# Patient Record
Sex: Female | Born: 1955
Health system: Southern US, Community
[De-identification: ages and names within clinical notes are randomized; demographics above are authoritative.]

## PROBLEM LIST (undated history)

## (undated) DIAGNOSIS — K219 Gastro-esophageal reflux disease without esophagitis: Secondary | ICD-10-CM

## (undated) DIAGNOSIS — E039 Hypothyroidism, unspecified: Secondary | ICD-10-CM

## (undated) DIAGNOSIS — E079 Disorder of thyroid, unspecified: Secondary | ICD-10-CM

## (undated) DIAGNOSIS — F419 Anxiety disorder, unspecified: Secondary | ICD-10-CM

## (undated) DIAGNOSIS — N3010 Interstitial cystitis (chronic) without hematuria: Secondary | ICD-10-CM

## (undated) DIAGNOSIS — M1990 Unspecified osteoarthritis, unspecified site: Secondary | ICD-10-CM

## (undated) DIAGNOSIS — R002 Palpitations: Secondary | ICD-10-CM

## (undated) DIAGNOSIS — E28319 Asymptomatic premature menopause: Secondary | ICD-10-CM

## (undated) DIAGNOSIS — M7918 Myalgia, other site: Secondary | ICD-10-CM

## (undated) DIAGNOSIS — K259 Gastric ulcer, unspecified as acute or chronic, without hemorrhage or perforation: Secondary | ICD-10-CM

## (undated) HISTORY — DX: Hypothyroidism, unspecified: E03.9

## (undated) HISTORY — DX: Interstitial cystitis (chronic) without hematuria: N30.10

## (undated) HISTORY — DX: Gastric ulcer, unspecified as acute or chronic, without hemorrhage or perforation: K25.9

## (undated) HISTORY — PX: CHOLECYSTECTOMY: SHX55

## (undated) HISTORY — PX: TONSILLECTOMY: SUR1361

## (undated) HISTORY — DX: Unspecified osteoarthritis, unspecified site: M19.90

## (undated) HISTORY — PX: CERVICAL CONE BIOPSY: SUR198

## (undated) HISTORY — DX: Palpitations: R00.2

## (undated) HISTORY — DX: Myalgia, other site: M79.18

## (undated) HISTORY — DX: Asymptomatic premature menopause: E28.319

## (undated) HISTORY — PX: DILATION AND CURETTAGE OF UTERUS: SHX78

## (undated) HISTORY — PX: CERVIX LESION DESTRUCTION: SHX591

---

## 1998-03-11 ENCOUNTER — Other Ambulatory Visit: Admission: RE | Admit: 1998-03-11 | Discharge: 1998-03-11 | Payer: Self-pay | Admitting: Gynecology

## 1998-05-09 ENCOUNTER — Other Ambulatory Visit: Admission: RE | Admit: 1998-05-09 | Discharge: 1998-05-09 | Payer: Self-pay | Admitting: Gynecology

## 1999-05-31 ENCOUNTER — Other Ambulatory Visit: Admission: RE | Admit: 1999-05-31 | Discharge: 1999-05-31 | Payer: Self-pay | Admitting: Gynecology

## 1999-12-19 ENCOUNTER — Emergency Department (HOSPITAL_COMMUNITY): Admission: EM | Admit: 1999-12-19 | Discharge: 1999-12-19 | Payer: Self-pay | Admitting: Podiatry

## 1999-12-22 ENCOUNTER — Encounter (HOSPITAL_COMMUNITY): Admission: RE | Admit: 1999-12-22 | Discharge: 2000-03-21 | Payer: Self-pay | Admitting: *Deleted

## 2000-06-17 ENCOUNTER — Other Ambulatory Visit: Admission: RE | Admit: 2000-06-17 | Discharge: 2000-06-17 | Payer: Self-pay | Admitting: Gynecology

## 2001-06-18 ENCOUNTER — Other Ambulatory Visit: Admission: RE | Admit: 2001-06-18 | Discharge: 2001-06-18 | Payer: Self-pay | Admitting: Gynecology

## 2002-07-30 ENCOUNTER — Other Ambulatory Visit: Admission: RE | Admit: 2002-07-30 | Discharge: 2002-07-30 | Payer: Self-pay | Admitting: Gynecology

## 2003-09-13 ENCOUNTER — Other Ambulatory Visit: Admission: RE | Admit: 2003-09-13 | Discharge: 2003-09-13 | Payer: Self-pay | Admitting: Gynecology

## 2004-11-22 ENCOUNTER — Other Ambulatory Visit: Admission: RE | Admit: 2004-11-22 | Discharge: 2004-11-22 | Payer: Self-pay | Admitting: Gynecology

## 2004-11-22 ENCOUNTER — Ambulatory Visit: Payer: Self-pay | Admitting: Internal Medicine

## 2004-11-30 ENCOUNTER — Ambulatory Visit: Payer: Self-pay | Admitting: Internal Medicine

## 2004-12-08 ENCOUNTER — Ambulatory Visit: Payer: Self-pay | Admitting: Internal Medicine

## 2004-12-22 ENCOUNTER — Ambulatory Visit: Payer: Self-pay | Admitting: Internal Medicine

## 2005-01-01 ENCOUNTER — Ambulatory Visit: Payer: Self-pay | Admitting: Internal Medicine

## 2005-01-12 ENCOUNTER — Ambulatory Visit: Payer: Self-pay | Admitting: Internal Medicine

## 2005-01-22 ENCOUNTER — Ambulatory Visit: Payer: Self-pay | Admitting: Internal Medicine

## 2005-02-02 ENCOUNTER — Ambulatory Visit: Payer: Self-pay | Admitting: Internal Medicine

## 2005-02-22 ENCOUNTER — Ambulatory Visit: Payer: Self-pay | Admitting: Internal Medicine

## 2005-03-01 ENCOUNTER — Ambulatory Visit: Payer: Self-pay | Admitting: Internal Medicine

## 2005-03-09 ENCOUNTER — Ambulatory Visit: Payer: Self-pay | Admitting: Internal Medicine

## 2005-03-20 ENCOUNTER — Ambulatory Visit: Payer: Self-pay | Admitting: Internal Medicine

## 2005-03-27 ENCOUNTER — Ambulatory Visit: Payer: Self-pay | Admitting: Internal Medicine

## 2005-04-09 ENCOUNTER — Ambulatory Visit: Payer: Self-pay | Admitting: Internal Medicine

## 2005-04-20 ENCOUNTER — Ambulatory Visit: Payer: Self-pay | Admitting: Internal Medicine

## 2005-04-24 ENCOUNTER — Ambulatory Visit: Payer: Self-pay | Admitting: Internal Medicine

## 2005-05-03 ENCOUNTER — Ambulatory Visit: Payer: Self-pay | Admitting: Internal Medicine

## 2005-05-24 ENCOUNTER — Ambulatory Visit: Payer: Self-pay | Admitting: Internal Medicine

## 2005-05-28 ENCOUNTER — Ambulatory Visit: Payer: Self-pay | Admitting: Internal Medicine

## 2005-06-04 ENCOUNTER — Ambulatory Visit: Payer: Self-pay | Admitting: Internal Medicine

## 2005-06-14 ENCOUNTER — Ambulatory Visit: Payer: Self-pay | Admitting: Internal Medicine

## 2005-06-29 ENCOUNTER — Ambulatory Visit: Payer: Self-pay | Admitting: Internal Medicine

## 2005-07-16 ENCOUNTER — Ambulatory Visit: Payer: Self-pay | Admitting: Internal Medicine

## 2005-07-26 ENCOUNTER — Ambulatory Visit: Payer: Self-pay | Admitting: Internal Medicine

## 2005-08-16 ENCOUNTER — Ambulatory Visit: Payer: Self-pay | Admitting: Internal Medicine

## 2005-08-23 ENCOUNTER — Ambulatory Visit: Payer: Self-pay | Admitting: Internal Medicine

## 2005-09-11 ENCOUNTER — Ambulatory Visit: Payer: Self-pay | Admitting: Internal Medicine

## 2005-12-24 ENCOUNTER — Other Ambulatory Visit: Admission: RE | Admit: 2005-12-24 | Discharge: 2005-12-24 | Payer: Self-pay | Admitting: Gynecology

## 2006-05-01 ENCOUNTER — Encounter: Admission: RE | Admit: 2006-05-01 | Discharge: 2006-05-01 | Payer: Self-pay | Admitting: Gastroenterology

## 2006-06-24 ENCOUNTER — Other Ambulatory Visit: Admission: RE | Admit: 2006-06-24 | Discharge: 2006-06-24 | Payer: Self-pay | Admitting: Gynecology

## 2006-07-08 ENCOUNTER — Encounter: Admission: RE | Admit: 2006-07-08 | Discharge: 2006-07-08 | Payer: Self-pay | Admitting: General Surgery

## 2006-08-05 ENCOUNTER — Encounter: Admission: RE | Admit: 2006-08-05 | Discharge: 2006-08-05 | Payer: Self-pay | Admitting: General Surgery

## 2007-07-09 ENCOUNTER — Other Ambulatory Visit: Admission: RE | Admit: 2007-07-09 | Discharge: 2007-07-09 | Payer: Self-pay | Admitting: Gynecology

## 2008-07-27 ENCOUNTER — Other Ambulatory Visit: Admission: RE | Admit: 2008-07-27 | Discharge: 2008-07-27 | Payer: Self-pay | Admitting: Gynecology

## 2008-10-20 ENCOUNTER — Emergency Department (HOSPITAL_COMMUNITY): Admission: EM | Admit: 2008-10-20 | Discharge: 2008-10-20 | Payer: Self-pay | Admitting: Family Medicine

## 2009-05-26 ENCOUNTER — Encounter: Admission: RE | Admit: 2009-05-26 | Discharge: 2009-05-26 | Payer: Self-pay | Admitting: Gastroenterology

## 2011-03-20 LAB — HM DEXA SCAN

## 2011-04-17 ENCOUNTER — Ambulatory Visit (INDEPENDENT_AMBULATORY_CARE_PROVIDER_SITE_OTHER): Payer: 59 | Admitting: Sports Medicine

## 2011-04-17 ENCOUNTER — Encounter: Payer: Self-pay | Admitting: Sports Medicine

## 2011-04-17 VITALS — BP 104/71 | Ht 59.5 in | Wt 97.0 lb

## 2011-04-17 DIAGNOSIS — M722 Plantar fascial fibromatosis: Secondary | ICD-10-CM

## 2011-04-17 DIAGNOSIS — M2020 Hallux rigidus, unspecified foot: Secondary | ICD-10-CM | POA: Insufficient documentation

## 2011-04-17 NOTE — Assessment & Plan Note (Signed)
Discussed with pt the importance to keep movement Gave ROM of motion exercises.  Will follow up in 6-8 weeks.

## 2011-04-17 NOTE — Progress Notes (Signed)
  Subjective:    Patient ID: Kristin Andersen, female    DOB: 11-17-1956, 55 y.o.   MRN: [id number]  HPI 55 yo female since February started having on and off right foot pain, but last 2 weeks has increased in pain.  Pt states the pain is worse right when she wakes up in the Am, takes time to relax, if she gets up from a seated position, or with significant activity.  The pain has made pt start to change activities of daily living.  Pt unable to run or do yoga at this time. Pt states the pain is on the plantar aspect of foot more medial non radiating no achilles pain associated, no true swelling or discoloration noted.  Pt denies injury but does state that it did seem to start after starting a dance class.   Review of Systems No numbness, loss of sensation, ever given out on her or popping, swelling or redness.     Objective:   Physical Exam    right foot:Normal inspection with no visable or palpable fat pad atrophy and no visible swelling/erythema. Patient is tender at medial insertion of plantar fascia into calcaneus. Great toe motion: restricted especially in extension Arch shape: moderate longitudinal arch present.  Other foot breakdown: none  Ultrasound:  Right foot, does show .45cm thickening of the plantar fascia at the insertion which is significantly thickened compared to left side (.33cm).  No true rupture mild calcinosis at the area of insertion no true edema seen, non tender during exam.  4 cm distal to insertion is a hypoechoic area and here the PF is much thicker than at baseline at 0.57 cms. Mild calcified area at the 1st MTP, but tendon moved throughout ROM without click/pop.  No edema. Achilles tendon appeared normal.    Assessment & Plan:

## 2011-04-17 NOTE — Patient Instructions (Signed)
Handout given with exercises.

## 2011-04-17 NOTE — Assessment & Plan Note (Signed)
Primary problem. Gave exercises and stretches for pt to do Gave arch strap Gave insoles with scaphoid pad. Will have pt RTC in 6-8 weeks to see if improving.

## 2011-06-20 LAB — HM COLONOSCOPY: HM Colonoscopy: NORMAL

## 2012-02-02 ENCOUNTER — Encounter (HOSPITAL_COMMUNITY): Payer: Self-pay | Admitting: Emergency Medicine

## 2012-02-02 ENCOUNTER — Emergency Department (HOSPITAL_COMMUNITY)
Admission: EM | Admit: 2012-02-02 | Discharge: 2012-02-02 | Disposition: A | Discharge status: Home / Self Care | Payer: 59 | Source: Home / Self Care | Attending: Family Medicine | Admitting: Family Medicine

## 2012-02-02 DIAGNOSIS — H109 Unspecified conjunctivitis: Secondary | ICD-10-CM

## 2012-02-02 HISTORY — DX: Disorder of thyroid, unspecified: E07.9

## 2012-02-02 MED ORDER — MOXIFLOXACIN HCL 0.5 % OP SOLN: 1.0000 [drp] | Freq: Three times a day (TID) | OPHTHALMIC | Status: AC

## 2012-02-02 NOTE — ED Notes (Signed)
Onset yesterday evening of pink eye.  Reports having a virus on Sunday, saw dr Clelia Croft, pcp, yesterday.  Eye symptoms started last night and crusty secretions to both eyes, but the right eye seems to be the worse, right eye is particularly itchy.  Started amoxicillin yesterday

## 2012-02-02 NOTE — ED Provider Notes (Signed)
History     CSN: [id number]  Arrival date & time 02/02/12  0910   First MD Initiated Contact with Patient 02/02/12 [phone]      Chief Complaint  Patient presents with  . Conjunctivitis    (Consider location/radiation/quality/duration/timing/severity/associated sxs/prior treatment) Patient is a 56 y.o. female presenting with conjunctivitis. The history is provided by the patient.  Conjunctivitis  The current episode started yesterday (sx onset last eve, seen by lmd for uri yest given amox,.). The problem has been gradually worsening. The problem is mild. Associated symptoms include eye itching, congestion, rhinorrhea, eye discharge and eye redness. Pertinent negatives include no eye pain.    Past Medical History  Diagnosis Date  . Thyroid disease   . Osteoporosis     Past Surgical History  Procedure Date  . Cesarean section   . Cholecystectomy     No family history on file.  History  Substance Use Topics  . Smoking status: Never Smoker   . Smokeless tobacco: Not on file  . Alcohol Use: Yes    OB History    Grav Para Term Preterm Abortions TAB SAB Ect Mult Living                  Review of Systems  Constitutional: Negative.   HENT: Positive for congestion, rhinorrhea and postnasal drip.   Eyes: Positive for discharge, redness and itching. Negative for pain.    Allergies  Review of patient's allergies indicates no known allergies.  Home Medications   Current Outpatient Rx  Name Route Sig Dispense Refill  . AMOXICILLIN PO Oral Take by mouth.    Marland Kitchen VITAMIN D PO Oral Take by mouth. calcium    . SYNTHROID PO Oral Take by mouth.    . RECLAST IV Intravenous Inject into the vein.    Marland Kitchen MOXIFLOXACIN HCL 0.5 % OP SOLN Both Eyes Place 1 drop into both eyes 3 (three) times daily. After warm compress soak to eyes 3 mL 0    BP 113/71  Pulse 71  Temp(Src) 99.4 F (37.4 C) (Oral)  Resp 17  SpO2 99%  Physical Exam  Nursing note and vitals reviewed. Constitutional:  She appears well-developed and well-nourished.  HENT:  Head: Normocephalic.  Right Ear: External ear normal.  Left Ear: External ear normal.  Nose: Nose normal.  Mouth/Throat: Oropharynx is clear and moist.  Eyes: EOM are normal. Pupils are equal, round, and reactive to light. Right eye exhibits discharge. Right eye exhibits no exudate. Left eye exhibits discharge. Left eye exhibits no exudate. Right conjunctiva is injected. Left conjunctiva is injected.    ED Course  Procedures (including critical care time)  Labs Reviewed - No data to display No results found.   1. Conjunctivitis of both eyes       MDM          Linna Hoff, MD 02/02/12 [phone]

## 2012-02-15 ENCOUNTER — Emergency Department (INDEPENDENT_AMBULATORY_CARE_PROVIDER_SITE_OTHER)
Admission: EM | Admit: 2012-02-15 | Discharge: 2012-02-15 | Disposition: A | Discharge status: Home Health | Payer: 59 | Source: Home / Self Care | Attending: Emergency Medicine | Admitting: Emergency Medicine

## 2012-02-15 ENCOUNTER — Encounter (HOSPITAL_COMMUNITY): Payer: Self-pay

## 2012-02-15 DIAGNOSIS — J329 Chronic sinusitis, unspecified: Secondary | ICD-10-CM

## 2012-02-15 MED ORDER — AZITHROMYCIN 250 MG PO TABS: 250.0000 mg | ORAL_TABLET | Freq: Every day | ORAL | Status: DC

## 2012-02-15 MED ORDER — AZITHROMYCIN 250 MG PO TABS: 250.0000 mg | ORAL_TABLET | Freq: Every day | ORAL | Status: AC

## 2012-02-15 NOTE — ED Notes (Signed)
Pt c/o sinus pain.  Pt states she was tx with ABX (amoxicillian) completed 10 day course Sunday and SX have returned.

## 2012-02-15 NOTE — Discharge Instructions (Signed)
Sinusitis Sinusitis an infection of the air pockets (sinuses) in your face. This can cause puffiness (swelling). It can also cause drainage from your sinuses.   HOME CARE    Only take medicine as told by your doctor.   Drink enough fluids to keep your pee (urine) clear or pale yellow.   Apply moist heat or ice packs for pain relief.   Use salt (saline) nose sprays. The spray will wet the thick fluid in the nose. This can help the sinuses drain.  GET HELP RIGHT AWAY IF:    You have a fever.   Your baby is older than 3 months with a rectal temperature of 102 F (38.9 C) or higher.   Your baby is 3 months old or younger with a rectal temperature of 100.4 F (38 C) or higher.   The pain gets worse.   You get a very bad headache.   You keep throwing up (vomiting).   Your face gets puffy.  MAKE SURE YOU:    Understand these instructions.   Will watch your condition.   Will get help right away if you are not doing well or get worse.  Document Released: 04/23/2008 Document Revised: 10/25/2011 Document Reviewed: 04/23/2008 [employer] Patient Information 2012 [employer]. 

## 2012-02-15 NOTE — ED Provider Notes (Signed)
History     CSN: [id number]  Arrival date & time 02/15/12  7829   First MD Initiated Contact with Patient 02/15/12 1033      Chief Complaint  Patient presents with  . Sinusitis    (Consider location/radiation/quality/duration/timing/severity/associated sxs/prior treatment) HPI Comments: For the last 3-4 days Sinus congestion has returned with significant pressure and drainage in my throat. Haven't completed and antibiotics cycle for 10 days because it was having a sinus infection it was not getting better on his own after I had the flu was seen by my Dr. and she started me on amoxicillin.  After I finished treatment for about 2 days felt much improved. No fevers. In mild cough  Patient is a 56 y.o. female presenting with sinusitis. The history is provided by the patient.  Sinusitis  This is a recurrent problem. The current episode started more than 2 days ago. The problem has been gradually worsening. There has been no fever. The pain is at a severity of 6/10. The pain is moderate. Associated symptoms include chills, congestion, sinus pressure, sore throat and cough. Pertinent negatives include no swollen glands and no shortness of breath. She has tried other medications for the symptoms. The treatment provided no relief.    Past Medical History  Diagnosis Date  . Thyroid disease   . Osteoporosis     Past Surgical History  Procedure Date  . Cesarean section   . Cholecystectomy     No family history on file.  History  Substance Use Topics  . Smoking status: Never Smoker   . Smokeless tobacco: Not on file  . Alcohol Use: Yes    OB History    Grav Para Term Preterm Abortions TAB SAB Ect Mult Living                  Review of Systems  Constitutional: Positive for chills. Negative for fever, diaphoresis and activity change.  HENT: Positive for congestion, sore throat, rhinorrhea and sinus pressure.   Respiratory: Positive for cough. Negative for shortness of breath.    Genitourinary: Negative for dysuria.  Skin: Negative for rash.  Neurological: Negative for speech difficulty and headaches.    Allergies  Review of patient's allergies indicates no known allergies.  Home Medications   Current Outpatient Rx  Name Route Sig Dispense Refill  . AMOXICILLIN PO Oral Take by mouth.    . AZITHROMYCIN 250 MG PO TABS Oral Take 1 tablet (250 mg total) by mouth daily. Take first 2 tablets together, then 1 every day until finished. 6 tablet 0  . VITAMIN D PO Oral Take by mouth. calcium    . SYNTHROID PO Oral Take by mouth.    . RECLAST IV Intravenous Inject into the vein.      BP 90/62  Pulse 80  Temp(Src) 97.4 F (36.3 C) (Oral)  Resp 20  Ht 4' 11.5" (1.511 m)  Wt 98 lb (44.453 kg)  BMI 19.46 kg/m2  SpO2 97%  Physical Exam  Nursing note and vitals reviewed. Constitutional: She is oriented to person, place, and time. She appears well-developed and well-nourished. No distress.  HENT:  Head: Normocephalic.  Right Ear: Tympanic membrane normal.  Left Ear: Tympanic membrane normal.  Nose: Rhinorrhea present. Right sinus exhibits frontal sinus tenderness. Left sinus exhibits frontal sinus tenderness.  Mouth/Throat: Uvula is midline. Posterior oropharyngeal erythema present. No oropharyngeal exudate.  Eyes: Conjunctivae are normal. No scleral icterus.  Neck: Neck supple. No JVD present.  Pulmonary/Chest: Effort  normal. No respiratory distress. She has no wheezes.  Abdominal: She exhibits no mass.  Musculoskeletal: Normal range of motion.  Lymphadenopathy:    She has no cervical adenopathy.  Neurological: She is alert and oriented to person, place, and time. She is not disoriented. No cranial nerve deficit or sensory deficit.  Skin: No rash noted.    ED Course  Procedures (including critical care time)  Labs Reviewed - No data to display No results found.   1. Sinusitis       MDM  Recurrent sinusitis        Jimmie Molly, MD 02/15/12  [phone]

## 2012-02-18 LAB — HM PAP SMEAR: HM Pap smear: NEGATIVE

## 2012-06-20 ENCOUNTER — Encounter: Payer: Self-pay | Admitting: Sports Medicine

## 2012-06-20 ENCOUNTER — Ambulatory Visit (INDEPENDENT_AMBULATORY_CARE_PROVIDER_SITE_OTHER): Payer: 59 | Admitting: Sports Medicine

## 2012-06-20 VITALS — BP 111/75 | HR 67 | Ht 59.5 in | Wt 98.0 lb

## 2012-06-20 DIAGNOSIS — M999 Biomechanical lesion, unspecified: Secondary | ICD-10-CM | POA: Insufficient documentation

## 2012-06-20 DIAGNOSIS — G5701 Lesion of sciatic nerve, right lower limb: Secondary | ICD-10-CM | POA: Insufficient documentation

## 2012-06-20 DIAGNOSIS — G5700 Lesion of sciatic nerve, unspecified lower limb: Secondary | ICD-10-CM

## 2012-06-20 NOTE — Assessment & Plan Note (Signed)
Decision today to treat with OMT was based on Physical Exam  After verbal consent patient was treated with ME and FPR techniques in sacral areas. Do to patient's sacral rotation this is likely causing patient's worsening of her piriformis syndrome.  Patient tolerated the procedure well with improvement in symptoms  Patient given exercises, stretches and lifestyle modifications including discussing proper footwear.  See medications in patient instructions if given  Patient will follow up in 3-4 weeks if needed patient may have another manipulation.

## 2012-06-20 NOTE — Progress Notes (Signed)
Kristin Andersen is a very pleasant 56 year old female coming in with 5 days of right posterior hip pain. Patient states that she was working out did a significant amount of dead lifts with her exercise program but did not have any pain there is bedtime. Patient noticed it 2 days later that she was having significant amount of pain in the area and a lot of stiffness. Patient does not remember any specific injury. Patient states that the pain is in the sacroiliac area as well as in the buttocks. Patient states that the pain seems to radiate around into the anterior hip but only minimal. Patient states that this has stopped her from doing any type of her regular activities due to the pain. Patient states that it is more of a dull throbbing in nature but with certain times of shooting pain. Patient denies any radiation down the leg and has had a history of sciatica on the contralateral side. Patient states that it's worse when she seems to be getting up in the morning or she is set for long amount of time. Patient is only tried anti-inflammatories once and states that it improved minimally.  Past medical history, social, surgical and family history all reviewed.   Review of systems is negative for any numbness or tingling in the extremity, any loss of strength, any swelling in the area in any crepitus. Patient also denies that the hip is having given out on her. Patient also denies fever chills or any recent viral illnesses.  Physical exam: Filed Vitals:   06/20/12 1140  BP: 111/75  Pulse: 67   General: No apparent distress very pleasant lady alert and oriented x3 mood is normal. Gait patient is very reserved with getting up from a sitting position do to stiffness. Patient though once able to ambulate does fine without much pain. Hip: ROM IR: 30 Deg, ER: 70 Deg, Flexion: 120 Deg, Extension: 100 Deg, Abduction: 45 Deg, Adduction: 45 Deg Strength IR: 5/5, ER: 5/5, Flexion: 5/5, Extension: 5/5, Abduction: 4/5,  Adduction: 4/5 Pelvic alignment unremarkable to inspection and palpation. Standing hip rotation and gait without trendelenburg / unsteadiness. Greater trochanter with minimal TTP. Positive tenderness over piriformis. Positive SI joint tenderness and normal minimal SI movement.  Osteopathic manipulation therapy findings. Patient does have a sacrum that is rotated right on right. Patient also has a right anterior ilium.

## 2012-06-20 NOTE — Assessment & Plan Note (Signed)
Patient was able to be manipulated with osteopathic therapy after verbal consent with 30% improvement in pain and immediately. Patient given exercises and stretches to do at home. In addition patient will do a three-day burst of anti-inflammatories scheduled to try to decrease inflammation. Patient told she can start doing her regular activities again and let pain be her guide. Patient continues to have pain she'll come back in 3 weeks' time for further evaluation with potential imaging.

## 2012-08-12 LAB — HM MAMMOGRAPHY: HM Mammogram: NEGATIVE

## 2012-09-19 HISTORY — PX: ROOT CANAL: SHX2363

## 2012-11-21 DIAGNOSIS — M7918 Myalgia, other site: Secondary | ICD-10-CM | POA: Insufficient documentation

## 2012-11-28 ENCOUNTER — Emergency Department (HOSPITAL_COMMUNITY)
Admission: EM | Admit: 2012-11-28 | Discharge: 2012-11-28 | Disposition: A | Discharge status: Home / Self Care | Payer: 59 | Attending: Emergency Medicine | Admitting: Emergency Medicine

## 2012-11-28 ENCOUNTER — Encounter (HOSPITAL_COMMUNITY): Payer: Self-pay | Admitting: Emergency Medicine

## 2012-11-28 DIAGNOSIS — Z79899 Other long term (current) drug therapy: Secondary | ICD-10-CM | POA: Insufficient documentation

## 2012-11-28 DIAGNOSIS — R6884 Jaw pain: Secondary | ICD-10-CM | POA: Insufficient documentation

## 2012-11-28 DIAGNOSIS — R109 Unspecified abdominal pain: Secondary | ICD-10-CM | POA: Insufficient documentation

## 2012-11-28 DIAGNOSIS — E079 Disorder of thyroid, unspecified: Secondary | ICD-10-CM | POA: Insufficient documentation

## 2012-11-28 DIAGNOSIS — M810 Age-related osteoporosis without current pathological fracture: Secondary | ICD-10-CM | POA: Insufficient documentation

## 2012-11-28 DIAGNOSIS — K5900 Constipation, unspecified: Secondary | ICD-10-CM | POA: Insufficient documentation

## 2012-11-28 DIAGNOSIS — K5641 Fecal impaction: Secondary | ICD-10-CM | POA: Insufficient documentation

## 2012-11-28 MED ORDER — PEG 3350-KCL-NABCB-NACL-NASULF 236 G PO SOLR: 4.0000 L | Freq: Once | ORAL | Status: DC

## 2012-11-28 NOTE — ED Notes (Signed)
Pt presenting to ed with c/o constipation x 4-5 days pt states abdominal cramping. Pt states she has been on some medications that she thinks may be causing her constipation. Pt states she was on vicodin but she hasn't taken it in about 1 week. Pt states she called her pcp and was told to present to the er

## 2012-11-28 NOTE — ED Provider Notes (Signed)
History     CSN: [id number]  Arrival date & time 11/28/12  1348   First MD Initiated Contact with Patient 11/28/12 1751      Chief Complaint  Patient presents with  . Constipation    (Consider location/radiation/quality/duration/timing/severity/associated sxs/prior treatment) HPI This 57 year old female has been on narcotics recently for a jaw pain, she has now been on a clear liquid diet for several days to avoid chewing to avoid causing worse jaw pain, she has not taken narcotics in a few days but has not had a bowel movement in several days, and she is intermittent abdominal crampy pain but no constant or localized abdominal pain, she is no bowel distention and bloating or nausea or vomiting, she is no chest pain cough or fever, there is no treatment prior to arrival other than Colace and another over-the-counter oral stool softener. Past Medical History  Diagnosis Date  . Thyroid disease   . Osteoporosis     Past Surgical History  Procedure Date  . Cesarean section   . Cholecystectomy     No family history on file.  History  Substance Use Topics  . Smoking status: Never Smoker   . Smokeless tobacco: Never Used  . Alcohol Use: Yes     Comment: rarely    OB History    Grav Para Term Preterm Abortions TAB SAB Ect Mult Living                  Review of Systems 10 Systems reviewed and are negative for acute change except as noted in the HPI. Allergies  Review of patient's allergies indicates no known allergies.  Home Medications   Current Outpatient Rx  Name  Route  Sig  Dispense  Refill  . VITAMIN D PO   Oral   Take 1,000 Units by mouth daily. calcium         . IBUPROFEN 200 MG PO TABS   Oral   Take 800 mg by mouth every 6 (six) hours as needed.         Marland Kitchen SYNTHROID PO   Oral   Take 50 mcg by mouth daily.          Marland Kitchen OXYMETAZOLINE HCL 0.05 % NA SOLN   Nasal   Place 2 sprays into the nose 2 (two) times daily.         Marland Kitchen ALIGN 4 MG PO CAPS  Oral   Take 1 capsule by mouth daily.         Marland Kitchen VITAMIN B-1 50 MG PO TABS   Oral   Take 50 mg by mouth daily.         Marland Kitchen PEG 3350-KCL-NABCB-NACL-NASULF 236 G PO SOLR   Oral   Take 4,000 mLs by mouth once.   4000 mL   0     BP 105/67  Pulse 71  Temp 98.9 F (37.2 C) (Oral)  Resp 18  SpO2 100%  Physical Exam  Nursing note and vitals reviewed. Constitutional:       Awake, alert, nontoxic appearance.  HENT:  Head: Atraumatic.  Eyes: Right eye exhibits no discharge. Left eye exhibits no discharge.  Neck: Neck supple.  Cardiovascular: Normal rate and regular rhythm.   No murmur heard. Pulmonary/Chest: Effort normal and breath sounds normal. No respiratory distress. She has no wheezes. She has no rales. She exhibits no tenderness.  Abdominal: Soft. Bowel sounds are normal. She exhibits no distension and no mass. There is no tenderness. There is  no rebound and no guarding.  Genitourinary:       Chaperone present, rectal examination reveals positive fecal impaction with hemorrhoids external without active bleeding upon initial evaluation, patient partially disimpacted manually by myself and enema ordered  Musculoskeletal: She exhibits no edema and no tenderness.       Baseline ROM, no obvious new focal weakness.  Neurological: She is alert.       Mental status and motor strength appears baseline for patient and situation.  Skin: No rash noted.  Psychiatric: She has a normal mood and affect.    ED Course  Procedures (including critical care time) Chaperone present for manual disimpaction by myself in the emergency department. Before the patient was even given an enema she was able to have a large bowel movement and felt much improved. Labs Reviewed - No data to display No results found.   1. Fecal impaction   2. Constipation       MDM  Pt stable in ED with no significant deterioration in condition.Patient / Family / Caregiver informed of clinical course, understand  medical decision-making process, and agree with plan.  I doubt any other EMC precluding discharge at this time including, but not necessarily limited to the following: Bowel obstruction.         Hurman Horn, MD 11/29/12 [phone]

## 2013-01-10 ENCOUNTER — Encounter: Payer: Self-pay | Admitting: Neurology

## 2013-02-26 ENCOUNTER — Ambulatory Visit (INDEPENDENT_AMBULATORY_CARE_PROVIDER_SITE_OTHER): Payer: PRIVATE HEALTH INSURANCE | Admitting: Neurology

## 2013-02-26 ENCOUNTER — Encounter: Payer: Self-pay | Admitting: Neurology

## 2013-02-26 VITALS — BP 106/73 | HR 76 | Ht 60.0 in | Wt 101.0 lb

## 2013-02-26 DIAGNOSIS — IMO0001 Reserved for inherently not codable concepts without codable children: Secondary | ICD-10-CM

## 2013-02-26 DIAGNOSIS — M7918 Myalgia, other site: Secondary | ICD-10-CM

## 2013-02-26 MED ORDER — MELOXICAM 7.5 MG PO TABS: 7.5000 mg | ORAL_TABLET | Freq: Every day | ORAL | Status: DC

## 2013-02-26 NOTE — Progress Notes (Signed)
Reason for visit: Jaw pain  Kristin Andersen is an 57 y.o. female  History of present illness:  Kristin Andersen is a 57 year old left-handed white female with a history of a myofascial pain syndrome on the right. This problem occurred following a dental procedure. The patient has had significant discomfort involving the right jaw. The patient initially could not tolerate the gabapentin or Robaxin. The patient was placed on Amrix, and this seemed to help. The patient was sent for neuromuscular therapy through [facility]. The patient has been receiving massage and ultrasound treatments which have been very effective. The patient still has some achy sensations in the jaw, but in general, she is much better. The patient tends to grind her teeth at night, but she is unable to wear her bite block secondary to the temporary crowns that are in her mouth. The patient will require another dental procedure to put the permanent crowns in. The patient had been on Advil, and she has since stopped the Amrix. The patient returns for an evaluation.  Past Medical History  Diagnosis Date  . Thyroid disease   . Osteoporosis   . Myofascial pain dysfunction syndrome     Right side  . Hypothyroidism     Past Surgical History  Procedure Laterality Date  . Cesarean section    . Cholecystectomy    . Tonsillectomy      Family History  Problem Relation Age of Onset  . Alzheimer's disease Mother   . Liver cancer Father   . Other Brother     Good Health  . Cancer Other     Social history:  reports that she has never smoked. She has never used smokeless tobacco. She reports that she drinks about 2.4 ounces of alcohol per week. She reports that she does not use illicit drugs.  Allergies:  Allergies  Allergen Reactions  . Dust Mite Extract   . Mold Extract (Trichophyton)   . Gabapentin     Dizzy, nausea  . Robaxin (Methocarbamol)     Dizzy, nausea    Medications:  Current Outpatient  Prescriptions on File Prior to Visit  Medication Sig Dispense Refill  . Cholecalciferol (VITAMIN D PO) Take 1,000 Units by mouth daily. calcium      . Levothyroxine Sodium (SYNTHROID PO) Take 50 mcg by mouth daily.       . Probiotic Product (ALIGN) 4 MG CAPS Take 1 capsule by mouth daily.      Marland Kitchen thiamine (VITAMIN B-1) 50 MG tablet Take 50 mg by mouth daily.      Marland Kitchen HYDROcodone-acetaminophen (NORCO/VICODIN) 5-325 MG per tablet Take 1 tablet by mouth every 4 (four) hours as needed for pain (Every 4-6 hours PRN).      Marland Kitchen ibuprofen (ADVIL,MOTRIN) 200 MG tablet Take 800 mg by mouth every 6 (six) hours as needed.      Marland Kitchen oxymetazoline (AFRIN) 0.05 % nasal spray Place 2 sprays into the nose 2 (two) times daily.      . polyethylene glycol (GOLYTELY) 236 G solution Take 4,000 mLs by mouth once.  4000 mL  0   No current facility-administered medications on file prior to visit.    ROS:  Out of a complete 14 system review of symptoms, the patient complains only of the following symptoms, and all other reviewed systems are negative.  Jaw pain  Blood pressure 106/73, pulse 76, height 5' (1.524 m), weight 101 lb (45.813 kg).  Physical Exam  General: The patient is alert  and cooperative at the time of the examination.  Skin: No significant peripheral edema is noted.   Neurologic Exam  Cranial nerves: Facial symmetry is present. Speech is normal, no aphasia or dysarthria is noted. Extraocular movements are full. Visual fields are full. With jaw opening and closure, there is some deviation of the jaw to the right when she opens her mouth. No crepitus is noted in the temporomandibular joints. The patient has good excursion of the jaw with opening and closing.  Motor: The patient has good strength in all 4 extremities.  Coordination: The patient has good finger-nose-finger and heel-to-shin bilaterally.  Gait and station: The patient has a normal gait. Tandem gait is normal. Romberg is negative. No  drift is seen.  Reflexes: Deep tendon reflexes are symmetric.   Assessment/Plan:  One. Myofascial pain syndrome  The patient is slowly improving with her pain syndrome, but she is still having some symptoms. The patient will be placed on Mobic to take on a daily basis over the next several months. The patient will require another dental procedure to put in the permanent crowns. This may exacerbate her pain. The patient is to continue therapy through [facility] for now. The patient has Amrix to take if needed. The patient will otherwise followup in 6 months or as needed.  Kristin Palau MD 02/26/2013 9:14 PM  [facility] [address] [place], [place] [address]  Phone [phone] Fax [phone]

## 2013-03-05 ENCOUNTER — Telehealth: Payer: Self-pay | Admitting: *Deleted

## 2013-03-05 MED ORDER — CYCLOBENZAPRINE HCL 10 MG PO TABS: 10.0000 mg | ORAL_TABLET | Freq: Every day | ORAL | Status: DC

## 2013-03-05 NOTE — Telephone Encounter (Signed)
Patient called stating she is still having jaw tighting, and wants to know if there's another muscle relaxer she could take, because the Amrix makes her goofy.

## 2013-03-05 NOTE — Telephone Encounter (Signed)
I called patient. The patient wants to go back on a muscle relaxants, but the Amrix made her feel bad during the day. I will give her short acting cyclobenzaprine at nighttime.

## 2013-03-09 ENCOUNTER — Encounter: Payer: Self-pay | Admitting: *Deleted

## 2013-03-10 ENCOUNTER — Ambulatory Visit (INDEPENDENT_AMBULATORY_CARE_PROVIDER_SITE_OTHER): Payer: PRIVATE HEALTH INSURANCE | Admitting: Obstetrics & Gynecology

## 2013-03-10 ENCOUNTER — Encounter: Payer: Self-pay | Admitting: Obstetrics & Gynecology

## 2013-03-10 DIAGNOSIS — Z01419 Encounter for gynecological examination (general) (routine) without abnormal findings: Secondary | ICD-10-CM

## 2013-03-10 MED ORDER — ESTRADIOL 0.1 MG/GM VA CREA: 1.0000 g | TOPICAL_CREAM | VAGINAL | Status: DC

## 2013-03-10 MED ORDER — ZOLPIDEM TARTRATE 10 MG PO TABS: 10.0000 mg | ORAL_TABLET | Freq: Every evening | ORAL | Status: DC | PRN

## 2013-03-10 NOTE — Patient Instructions (Signed)

## 2013-03-10 NOTE — Progress Notes (Signed)
57 y.o. G2P2 MarriedCaucasianF here for annual exam.  Diagnosed with myofascial pain syndrome of the face/jaw.  Had three root canals over two week time period in late November/early December.  Really struggled with pain.  Ended up with neurology due to all the pain.  Also so [facility].  Ultrasound to jaw really helped.  Pain is really much better.  Now just feels like a muscular issue.     No LMP recorded. Patient is postmenopausal.          Sexually active: yes  The current method of family planning is post menopausal status.    Exercising: yes  yoga and cross training Smoker:  no  Health Maintenance: Pap:  02/18/12 WNL/negative HR HPV MMG:  08/12/12 normal Colonoscopy:  2012 repeat 5 years BMD:   2012 stable TDaP:  2012 Labs: Dr. Lupita Raider in summer 2013   reports that she has never smoked. She has never used smokeless tobacco. She reports that she drinks about 3.0 ounces of alcohol per week. She reports that she does not use illicit drugs.  Past Medical History  Diagnosis Date  . Thyroid disease   . Osteoporosis   . Myofascial pain dysfunction syndrome     Right side  . Hypothyroidism   . Premature menopause     Past Surgical History  Procedure Laterality Date  . Cesarean section    . Cholecystectomy    . Tonsillectomy    . Dilation and curettage of uterus    . Cervix lesion destruction    . Root canal  11/13    3 on 2 teeth    Current Outpatient Prescriptions  Medication Sig Dispense Refill  . azelastine (ASTELIN) 137 MCG/SPRAY nasal spray Place 1 spray into the nose at bedtime as needed for rhinitis. Use in each nostril as directed      . B Complex-C (SUPER B COMPLEX PO) Take by mouth daily.      . Calcium Carbonate-Vit D-Min (CALCIUM 1200 PO) Take by mouth daily.      . Cholecalciferol (VITAMIN D PO) Take 1,000 Units by mouth daily. calcium      . cyclobenzaprine (FLEXERIL) 10 MG tablet Take 1 tablet (10 mg total) by mouth at bedtime.  30 tablet   3  . estradiol (ESTRACE) 0.1 MG/GM vaginal cream Place 1 g vaginally 2 (two) times a week.      Marland Kitchen ibuprofen (ADVIL,MOTRIN) 200 MG tablet Take 800 mg by mouth every 6 (six) hours as needed.      . Levothyroxine Sodium (SYNTHROID PO) Take 50 mcg by mouth daily.       Marland Kitchen loratadine (CLARITIN) 10 MG tablet Take 10 mg by mouth daily.      Marland Kitchen MAGNESIUM PO Take by mouth daily.      . meloxicam (MOBIC) 7.5 MG tablet Take 1 tablet (7.5 mg total) by mouth daily.  30 tablet  3  . Probiotic Product (ALIGN) 4 MG CAPS Take 1 capsule by mouth daily.      . ranitidine (ZANTAC) 75 MG tablet Take 75 mg by mouth daily.      Marland Kitchen zolpidem (AMBIEN) 10 MG tablet Take 10 mg by mouth at bedtime as needed for sleep.       No current facility-administered medications for this visit.    Family History  Problem Relation Age of Onset  . Alzheimer's disease Mother   . Liver cancer Father   . Other Brother  Good Health  . Cancer Other     ROS:  Pertinent items are noted in HPI.  Otherwise, a comprehensive ROS was negative.  Exam:   There were no vitals taken for this visit.  Height:      Ht Readings from Last 3 Encounters:  02/26/13 5' (1.524 m)  01/10/13 5' (1.524 m)  06/20/12 4' 11.5" (1.511 m)    General appearance: alert, cooperative and appears stated age Head: Normocephalic, without obvious abnormality, atraumatic Neck: no adenopathy, supple, symmetrical, trachea midline and thyroid normal to inspection and palpation Lungs: clear to auscultation bilaterally Breasts: normal appearance, no masses or tenderness Heart: regular rate and rhythm Abdomen: soft, non-tender; bowel sounds normal; no masses,  no organomegaly Extremities: extremities normal, atraumatic, no cyanosis or edema Skin: Skin color, texture, turgor normal. No rashes or lesions Lymph nodes: Cervical, supraclavicular, and axillary nodes normal. No abnormal inguinal nodes palpated Neurologic: Grossly normal   Pelvic: External  genitalia:  no lesions              Urethra:  normal appearing urethra with no masses, tenderness or lesions              Bartholins and Skenes: normal                 Vagina: normal appearing vagina with normal color and discharge, no lesions, atrophic              Cervix: no lesions              Pap taken: no Bimanual Exam:  Uterus:  normal size, contour, position, consistency, mobility, non-tender              Adnexa: no mass, fullness, tenderness               Rectovaginal: Confirms               Anus:  normal sphincter tone, no lesions  A:  Well Woman with normal exam Myofascial pain syndrome of face after root canals Vaginal atrophy with history of dyspareunia, resolved with Estrace cream Ostoeporosis (followed by PCP) H/O premature menopause   P:   Mammogram, d/w pt 3D due to grade IV breast density Continue estrace cream 1 gram pv twice weekly/2 RF RF for ambien given pap smear with neg HR HPV 2013 return annually or prn  An After Visit Summary was printed and given to the patient.

## 2013-07-19 ENCOUNTER — Other Ambulatory Visit: Payer: Self-pay | Admitting: Neurology

## 2013-08-28 ENCOUNTER — Ambulatory Visit (INDEPENDENT_AMBULATORY_CARE_PROVIDER_SITE_OTHER): Payer: PRIVATE HEALTH INSURANCE | Admitting: Neurology

## 2013-08-28 ENCOUNTER — Encounter: Payer: Self-pay | Admitting: Neurology

## 2013-08-28 VITALS — BP 98/67 | HR 72 | Wt 101.0 lb

## 2013-08-28 DIAGNOSIS — M7918 Myalgia, other site: Secondary | ICD-10-CM

## 2013-08-28 DIAGNOSIS — IMO0001 Reserved for inherently not codable concepts without codable children: Secondary | ICD-10-CM

## 2013-08-28 NOTE — Progress Notes (Signed)
Reason for visit: Myofascial pain syndrome  Kristin Andersen is an 57 y.o. female  History of present illness:  Kristin Andersen is a 57 year old left-handed white female with a history of a myofascial pain syndrome induced from a dental procedure on the right side. The patient has had symptoms for 11 months, and she required another dental procedure in the summer of 2014 that exacerbated her symptoms. The patient however, has continued her neuromuscular treatments through [facility], and she has begun acupuncture therapies that have markedly improved her symptoms. The patient still has some tightness in her right jaw, but overall she feels much better. The patient returns for an evaluation. The patient is taking the Mobic and the Amrix only as needed.  Past Medical History  Diagnosis Date  . Thyroid disease   . Osteoporosis   . Myofascial pain dysfunction syndrome     Right side  . Hypothyroidism   . Premature menopause     Past Surgical History  Procedure Laterality Date  . Cesarean section    . Cholecystectomy    . Tonsillectomy    . Dilation and curettage of uterus    . Cervix lesion destruction    . Root canal  11/13    3 on 2 teeth    Family History  Problem Relation Age of Onset  . Alzheimer's disease Mother   . Liver cancer Father   . Other Brother     Good Health  . Cancer Other     Social history:  reports that she has never smoked. She has never used smokeless tobacco. She reports that she drinks about 3.0 ounces of alcohol per week. She reports that she does not use illicit drugs.    Allergies  Allergen Reactions  . Dust Mite Extract   . Mold Extract [Trichophyton]   . Gabapentin     Dizzy, nausea  . Robaxin [Methocarbamol]     Dizzy, nausea    Medications:  Current Outpatient Prescriptions on File Prior to Visit  Medication Sig Dispense Refill  . azelastine (ASTELIN) 137 MCG/SPRAY nasal spray Place 1 spray into the nose at bedtime as  needed for rhinitis. Use in each nostril as directed      . B Complex-C (SUPER B COMPLEX PO) Take by mouth daily.      . Calcium Carbonate-Vit D-Min (CALCIUM 1200 PO) Take by mouth daily.      . Cholecalciferol (VITAMIN D PO) Take 1,000 Units by mouth daily. calcium      . estradiol (ESTRACE) 0.1 MG/GM vaginal cream Place 0.25 Applicatorfuls vaginally 2 (two) times a week.  42.5 g  3  . ibuprofen (ADVIL,MOTRIN) 200 MG tablet Take 800 mg by mouth every 6 (six) hours as needed.      . Levothyroxine Sodium (SYNTHROID PO) Take 50 mcg by mouth daily.       Marland Kitchen loratadine (CLARITIN) 10 MG tablet Take 10 mg by mouth daily.      Marland Kitchen MAGNESIUM PO Take by mouth daily.      . Probiotic Product (ALIGN) 4 MG CAPS Take 1 capsule by mouth daily.      . ranitidine (ZANTAC) 75 MG tablet Take 75 mg by mouth daily.      Marland Kitchen zolpidem (AMBIEN) 10 MG tablet Take 1 tablet (10 mg total) by mouth at bedtime as needed for sleep. Taking 1/2 tab  30 tablet  2   No current facility-administered medications on file prior to visit.  ROS:  Out of a complete 14 system review of symptoms, the patient complains only of the following symptoms, and all other reviewed systems are negative.  Myofascial pain, right jaw  Blood pressure 98/67, pulse 72, weight 101 lb (45.813 kg), last menstrual period 11/19/1989.  Physical Exam  General: The patient is alert and cooperative at the time of the examination.  Skin: No significant peripheral edema is noted.   Neurologic Exam  Cranial nerves: Facial symmetry is present. Speech is normal, no aphasia or dysarthria is noted. Extraocular movements are full. Visual fields are full. The patient still has some incomplete jaw opening, no significant crepitus is noted in the temporomandibular joints.  Motor: The patient has good strength in all 4 extremities.  Coordination: The patient has good finger-nose-finger and heel-to-shin bilaterally.  Gait and station: The patient has a normal  gait. Tandem gait is normal. Romberg is negative. No drift is seen.  Reflexes: Deep tendon reflexes are symmetric.   Assessment/Plan:  1. Right myofascial pain syndrome  The patient is doing very well at this point. The patient will take the medication such as Mobic and Amrix only if needed. The patient will followup through this office if needed.  Marlan Palau MD 08/28/2013 1:05 PM  [facility] [address] [place], [place] [address]  Phone [phone] Fax [phone]

## 2013-09-24 ENCOUNTER — Other Ambulatory Visit: Payer: Self-pay

## 2013-11-16 ENCOUNTER — Telehealth: Payer: Self-pay | Admitting: Neurology

## 2013-11-16 MED ORDER — CYCLOBENZAPRINE HCL ER 15 MG PO CP24: 15.0000 mg | ORAL_CAPSULE | Freq: Every day | ORAL | Status: DC | PRN

## 2013-11-16 NOTE — Telephone Encounter (Signed)
Patient needs refill of Amrix - only has 1 tablet left - questions need to make an apt to see Dr. Anne Hahn.

## 2013-11-16 NOTE — Telephone Encounter (Signed)
Patient requesting refill on Amrix. Please advise, I will call her to schedule f/u appt.

## 2013-12-01 ENCOUNTER — Ambulatory Visit (INDEPENDENT_AMBULATORY_CARE_PROVIDER_SITE_OTHER): Payer: PRIVATE HEALTH INSURANCE | Admitting: Neurology

## 2013-12-01 ENCOUNTER — Encounter: Payer: Self-pay | Admitting: Neurology

## 2013-12-01 VITALS — BP 120/74 | HR 124 | Wt 104.0 lb

## 2013-12-01 DIAGNOSIS — M7918 Myalgia, other site: Secondary | ICD-10-CM

## 2013-12-01 DIAGNOSIS — IMO0001 Reserved for inherently not codable concepts without codable children: Secondary | ICD-10-CM

## 2013-12-01 MED ORDER — CYCLOBENZAPRINE HCL ER 15 MG PO CP24: 15.0000 mg | ORAL_CAPSULE | Freq: Every day | ORAL | Status: DC | PRN

## 2013-12-01 NOTE — Progress Notes (Signed)
Reason for visit: Myofascial pain syndrome  Kristin Andersen is an 58 y.o. female  History of present illness:  Kristin Andersen is a 58 year old left-handed white female with a history of a myofascial pain syndrome on the right side. The patient has had worsening of symptoms in October 2014 were spontaneous in nature unassociated with a dental procedure. The patient has undergone acupuncture with some benefit, and she has gone back on Amrix with good benefit. The patient in the past is gaining benefit as well from neuromuscular therapy through [facility]. The patient will have a tremor in the jaw if she tries to close mouth completely. The patient has had some constipation problems and some dry mouth associated with Amrix. The patient returns for an evaluation.  Past Medical History  Diagnosis Date  . Thyroid disease   . Osteoporosis   . Myofascial pain dysfunction syndrome     Right side  . Hypothyroidism   . Premature menopause     Past Surgical History  Procedure Laterality Date  . Cesarean section    . Cholecystectomy    . Tonsillectomy    . Dilation and curettage of uterus    . Cervix lesion destruction    . Root canal  11/13    3 on 2 teeth    Family History  Problem Relation Age of Onset  . Alzheimer's disease Mother   . Liver cancer Father   . Other Brother     Good Health  . Cancer Other     Social history:  reports that she has never smoked. She has never used smokeless tobacco. She reports that she drinks about 3.0 ounces of alcohol per week. She reports that she does not use illicit drugs.    Allergies  Allergen Reactions  . Dust Mite Extract   . Mold Extract [Trichophyton]   . Gabapentin     Dizzy, nausea  . Robaxin [Methocarbamol]     Dizzy, nausea    Medications:  Current Outpatient Prescriptions on File Prior to Visit  Medication Sig Dispense Refill  . azelastine (ASTELIN) 137 MCG/SPRAY nasal spray Place 1 spray into the nose at  bedtime as needed for rhinitis. Use in each nostril as directed      . B Complex-C (SUPER B COMPLEX PO) Take by mouth daily.      . Calcium Carbonate-Vit D-Min (CALCIUM 1200 PO) Take by mouth daily.      . Cholecalciferol (VITAMIN D PO) Take 1,000 Units by mouth daily. calcium      . estradiol (ESTRACE) 0.1 MG/GM vaginal cream Place 9.83 Applicatorfuls vaginally 2 (two) times a week.  42.5 g  3  . ibuprofen (ADVIL,MOTRIN) 200 MG tablet Take 800 mg by mouth every 6 (six) hours as needed.      . Levothyroxine Sodium (SYNTHROID PO) Take 50 mcg by mouth daily.       Marland Kitchen loratadine (CLARITIN) 10 MG tablet Take 10 mg by mouth daily.      Marland Kitchen MAGNESIUM PO Take by mouth daily.      . Probiotic Product (ALIGN) 4 MG CAPS Take 1 capsule by mouth daily.      . ranitidine (ZANTAC) 75 MG tablet Take 75 mg by mouth daily.      Marland Kitchen zolpidem (AMBIEN) 10 MG tablet Take 1 tablet (10 mg total) by mouth at bedtime as needed for sleep. Taking 1/2 tab  30 tablet  2   No current facility-administered medications on file prior  to visit.    ROS:  Out of a complete 14 system review of symptoms, the patient complains only of the following symptoms, and all other reviewed systems are negative.  Jaw pain Constipation  Blood pressure 120/74, pulse 124, weight 104 lb (47.174 kg), last menstrual period 11/19/1989.  Physical Exam  General: The patient is alert and cooperative at the time of the examination.  Neuromuscular: The patient has good range of motion of the cervical spine. With jaw opening, there is slight displacement of the jaw to the right. No significant tenderness around the temporomandibular joint or the temporalis muscles is seen.  Skin: No significant peripheral edema is noted.   Neurologic Exam  Mental status: The patient is oriented x 3.  Cranial nerves: Facial symmetry is present. Speech is normal, no aphasia or dysarthria is noted. Extraocular movements are full. Visual fields are full.  Motor:  The patient has good strength in all 4 extremities.  Sensory examination: Soft touch sensation on the arms and the face is symmetric.  Coordination: The patient has good finger-nose-finger and heel-to-shin bilaterally.  Gait and station: The patient has a normal gait. Tandem gait is normal. Romberg is negative. No drift is seen.  Reflexes: Deep tendon reflexes are symmetric.   Assessment/Plan:  1. Right myofascial pain syndrome  The patient will be sent back for physical therapy for the myofascial pain syndrome. The patient will continue Amrix for now, but as she improves, she may stop this medication. The patient will followup through this office if needed. The patient is getting a bite block refitted through her dentist.  Jill Alexanders MD 12/01/2013 10:21 AM  [facility] [address] [place], [place] [address]  Phone [phone] Fax [phone]

## 2013-12-01 NOTE — Patient Instructions (Signed)
Myofascial Pain Syndrome  Myofascial pain syndrome is a pain disorder. This pain may be felt in the muscles. It may come and go. Myofascial pain syndrome always has trigger or tender points in the muscle that will cause pain when pressed.   CAUSES  Myofascial pain may be caused by injuries, especially auto accidents, or by overuse of certain muscles. Typically the pain is long lasting. It is made worse by overuse of the involved muscles, emotional distress, and by cold, damp weather. Myofascial pain syndrome often develops in patients whose response to stress is an increase in muscle tone, and is seen in greater frequency in patients with pre-existing tension headaches.  SYMPTOMS   Myofascial pain syndrome causes a wide variety of symptoms. You may see tight ropy bands of muscle. Problems may also include aching, cramping, burning, numbness, tingling, and other uncomfortable sensations in muscular areas. It most commonly affects the neck, upper back, and shoulder areas. Pain often radiates into the arms and hands.   TREATMENT  Treatment includes resting the affected muscular area and applying ice packs to reduce spasm and pain. Trigger point injection, is a valuable initial therapy. This therapy is an injection of local anesthetic directly into the trigger point. Trigger points are often present at the source of pain. Pain relief following injection confirms the diagnosis of myofascial pain syndrome. Fairly vigorous therapy can be carried out during the pain-free period after each injection. Stretching exercises to loosen up the muscles are also useful. Transcutaneous electrical nerve stimulation (TENS) may provide relief from pain. TENS is the use of electric current produced by a device to stimulate the nerves. Ultrasound therapy applied directly over the affected muscle may also provide pain relief. Anti-inflammatory pain medicine can be helpful. Symptoms will gradually improve over a period of weeks to months  with proper treatment.  HOME CARE INSTRUCTIONS  Call your caregiver for follow-up care as recommended.   SEEK MEDICAL CARE IF:   Your pain is severe and not helped with medications.  Document Released: 12/13/2004 Document Revised: 01/28/2012 Document Reviewed: 12/22/2010  [employer]® Patient Information ©2014 [employer].

## 2014-01-08 ENCOUNTER — Ambulatory Visit: Payer: 59 | Attending: Adult Health | Admitting: Physical Therapy

## 2014-01-08 DIAGNOSIS — M629 Disorder of muscle, unspecified: Secondary | ICD-10-CM | POA: Insufficient documentation

## 2014-01-08 DIAGNOSIS — M2420 Disorder of ligament, unspecified site: Secondary | ICD-10-CM | POA: Insufficient documentation

## 2014-01-08 DIAGNOSIS — IMO0001 Reserved for inherently not codable concepts without codable children: Secondary | ICD-10-CM | POA: Insufficient documentation

## 2014-01-08 DIAGNOSIS — M810 Age-related osteoporosis without current pathological fracture: Secondary | ICD-10-CM | POA: Insufficient documentation

## 2014-01-18 ENCOUNTER — Ambulatory Visit: Payer: 59 | Attending: Adult Health | Admitting: Physical Therapy

## 2014-01-18 DIAGNOSIS — M629 Disorder of muscle, unspecified: Secondary | ICD-10-CM | POA: Insufficient documentation

## 2014-01-18 DIAGNOSIS — M810 Age-related osteoporosis without current pathological fracture: Secondary | ICD-10-CM | POA: Insufficient documentation

## 2014-01-18 DIAGNOSIS — M2420 Disorder of ligament, unspecified site: Secondary | ICD-10-CM | POA: Insufficient documentation

## 2014-01-18 DIAGNOSIS — IMO0001 Reserved for inherently not codable concepts without codable children: Secondary | ICD-10-CM | POA: Insufficient documentation

## 2014-01-19 ENCOUNTER — Ambulatory Visit: Payer: 59 | Admitting: Physical Therapy

## 2014-01-22 ENCOUNTER — Ambulatory Visit: Payer: 59 | Admitting: Physical Therapy

## 2014-01-26 ENCOUNTER — Ambulatory Visit: Payer: 59 | Admitting: Physical Therapy

## 2014-01-29 ENCOUNTER — Ambulatory Visit: Payer: 59 | Admitting: Physical Therapy

## 2014-02-02 ENCOUNTER — Ambulatory Visit: Payer: 59 | Admitting: Physical Therapy

## 2014-02-09 ENCOUNTER — Ambulatory Visit: Payer: 59 | Admitting: Physical Therapy

## 2014-02-12 ENCOUNTER — Encounter: Payer: 59 | Admitting: Physical Therapy

## 2014-02-16 ENCOUNTER — Ambulatory Visit: Payer: 59 | Admitting: Physical Therapy

## 2014-02-18 ENCOUNTER — Ambulatory Visit: Payer: 59 | Attending: Adult Health | Admitting: Physical Therapy

## 2014-02-18 DIAGNOSIS — IMO0001 Reserved for inherently not codable concepts without codable children: Secondary | ICD-10-CM | POA: Insufficient documentation

## 2014-02-18 DIAGNOSIS — M810 Age-related osteoporosis without current pathological fracture: Secondary | ICD-10-CM | POA: Insufficient documentation

## 2014-02-18 DIAGNOSIS — M2420 Disorder of ligament, unspecified site: Secondary | ICD-10-CM | POA: Insufficient documentation

## 2014-02-18 DIAGNOSIS — M629 Disorder of muscle, unspecified: Secondary | ICD-10-CM | POA: Insufficient documentation

## 2014-02-24 ENCOUNTER — Ambulatory Visit: Payer: 59 | Admitting: Physical Therapy

## 2014-02-24 ENCOUNTER — Other Ambulatory Visit: Payer: Self-pay | Admitting: Obstetrics & Gynecology

## 2014-02-24 NOTE — Telephone Encounter (Signed)
Last refilled: 03/10/13 #30/2 refills Last AEX: 03/10/13 AEX Scheduled: 04/20/14   Okay to refill?  Please Advise.

## 2014-02-26 ENCOUNTER — Telehealth: Payer: Self-pay | Admitting: Obstetrics & Gynecology

## 2014-02-26 NOTE — Telephone Encounter (Signed)
Patient calling for refill on generic Ambien. She will need this before the weekend she requested.  [facility] [address] and [address]

## 2014-02-26 NOTE — Telephone Encounter (Signed)
Rx faxed today. - Patient notified.

## 2014-03-02 ENCOUNTER — Ambulatory Visit: Payer: 59 | Admitting: Physical Therapy

## 2014-03-05 ENCOUNTER — Encounter: Payer: 59 | Admitting: Physical Therapy

## 2014-03-19 ENCOUNTER — Ambulatory Visit: Payer: 59 | Attending: Adult Health | Admitting: Physical Therapy

## 2014-03-19 DIAGNOSIS — M810 Age-related osteoporosis without current pathological fracture: Secondary | ICD-10-CM | POA: Insufficient documentation

## 2014-03-19 DIAGNOSIS — M629 Disorder of muscle, unspecified: Secondary | ICD-10-CM | POA: Insufficient documentation

## 2014-03-19 DIAGNOSIS — IMO0001 Reserved for inherently not codable concepts without codable children: Secondary | ICD-10-CM | POA: Insufficient documentation

## 2014-03-19 DIAGNOSIS — M2420 Disorder of ligament, unspecified site: Secondary | ICD-10-CM | POA: Insufficient documentation

## 2014-04-20 ENCOUNTER — Ambulatory Visit: Payer: PRIVATE HEALTH INSURANCE | Admitting: Obstetrics & Gynecology

## 2014-05-10 ENCOUNTER — Ambulatory Visit: Payer: PRIVATE HEALTH INSURANCE | Admitting: Obstetrics & Gynecology

## 2014-05-24 ENCOUNTER — Encounter: Payer: Self-pay | Admitting: Obstetrics & Gynecology

## 2014-05-24 ENCOUNTER — Ambulatory Visit (INDEPENDENT_AMBULATORY_CARE_PROVIDER_SITE_OTHER): Payer: PRIVATE HEALTH INSURANCE | Admitting: Obstetrics & Gynecology

## 2014-05-24 VITALS — BP 104/64 | HR 60 | Resp 16 | Ht 58.75 in | Wt 99.6 lb

## 2014-05-24 DIAGNOSIS — Z01419 Encounter for gynecological examination (general) (routine) without abnormal findings: Secondary | ICD-10-CM

## 2014-05-24 DIAGNOSIS — Z124 Encounter for screening for malignant neoplasm of cervix: Secondary | ICD-10-CM

## 2014-05-24 MED ORDER — ZOLPIDEM TARTRATE 10 MG PO TABS: ORAL_TABLET | ORAL | Status: DC

## 2014-05-24 MED ORDER — ESTRADIOL 0.1 MG/GM VA CREA: TOPICAL_CREAM | VAGINAL | Status: DC

## 2014-05-24 NOTE — Progress Notes (Signed)
58 y.o. G2P2 MarriedCaucasianF here for annual exam.  Has some questions about bone density.  PCP:  Dr. Mayra Neer.  No vaginal bleeding.  Considering starting Wellbutrin for anxiety.  Wants my opinion about this.  Needs BMD scheduled.  Off bisphosphates now.   May want to start something else.     Patient's last menstrual period was 11/19/1989.          Sexually active: Yes.    The current method of family planning is post menopausal status.    Exercising: Yes.    walking Smoker:  no  Health Maintenance: Pap:  02/18/12 WNL/negative HR HPV History of abnormal Pap:  Yes h/o cone biopsy MMG:  11/24/13 3D-normal Colonoscopy:  8/12-repeat in 5 years (Dr. Collene Mares) BMD:   5/12-osteoporosis-stable TDaP:  2012 Screening Labs: PCP, Hb today: PCP, Urine today: PCP   reports that she has never smoked. She has never used smokeless tobacco. She reports that she drinks about 2.4 - 3 ounces of alcohol per week. She reports that she does not use illicit drugs.  Past Medical History  Diagnosis Date  . Thyroid disease   . Osteoporosis   . Myofascial pain dysfunction syndrome     Right side  . Hypothyroidism   . Premature menopause   . IC (interstitial cystitis)     Past Surgical History  Procedure Laterality Date  . Cesarean section    . Cholecystectomy    . Tonsillectomy    . Dilation and curettage of uterus    . Cervix lesion destruction    . Root canal  11/13    3 on 2 teeth  . Root canal  04/16/14 and 04/27/14    x2    Current Outpatient Prescriptions  Medication Sig Dispense Refill  . azelastine (ASTELIN) 137 MCG/SPRAY nasal spray Place 1 spray into the nose at bedtime as needed for rhinitis. Use in each nostril as directed      . B Complex-C (SUPER B COMPLEX PO) Take by mouth daily.      . Calcium Carbonate-Vit D-Min (CALCIUM 1200 PO) Take by mouth daily.      . Cholecalciferol (VITAMIN D PO) Take 5,000 Units by mouth daily. calcium      . estradiol (ESTRACE) 0.1 MG/GM vaginal cream  Place 7.10 Applicatorfuls vaginally 2 (two) times a week.  42.5 g  3  . ibuprofen (ADVIL,MOTRIN) 200 MG tablet Take 800 mg by mouth every 6 (six) hours as needed.      . Levothyroxine Sodium (SYNTHROID PO) Take 50 mcg by mouth daily.       Marland Kitchen loratadine (CLARITIN) 10 MG tablet Take 10 mg by mouth daily.      Marland Kitchen MAGNESIUM PO Take by mouth daily.      . Probiotic Product (ALIGN) 4 MG CAPS Take 1 capsule by mouth daily.      . ranitidine (ZANTAC) 75 MG tablet Take 75 mg by mouth daily.      Marland Kitchen zolpidem (AMBIEN) 10 MG tablet TAKE 1 TABLET BY MOUTH AT BEDTIME AS NEEDED FOR SLEEP. TAKE 1/2 TABLET...  30 tablet  0  . FLUARIX QUADRIVALENT 0.5 ML injection Inject 0.5 mLs into the muscle once.       No current facility-administered medications for this visit.    Family History  Problem Relation Age of Onset  . Alzheimer's disease Mother   . Liver cancer Father   . Other Brother     Good Health  . Cancer Other  ROS:  Pertinent items are noted in HPI.  Otherwise, a comprehensive ROS was negative.  Exam:   BP 104/64  Pulse 60  Resp 16  Ht 4' 10.75" (1.492 m)  Wt 99 lb 9.6 oz (45.178 kg)  BMI 20.30 kg/m2  LMP 11/19/1989  Height: 4' 10.75" (149.2 cm)  Ht Readings from Last 3 Encounters:  05/24/14 4' 10.75" (1.492 m)  02/26/13 5' (1.524 m)  01/10/13 5' (1.524 m)    General appearance: alert, cooperative and appears stated age Head: Normocephalic, without obvious abnormality, atraumatic Neck: no adenopathy, supple, symmetrical, trachea midline and thyroid normal to inspection and palpation Lungs: clear to auscultation bilaterally Breasts: normal appearance, no masses or tenderness Heart: regular rate and rhythm Abdomen: soft, non-tender; bowel sounds normal; no masses,  no organomegaly Extremities: extremities normal, atraumatic, no cyanosis or edema Skin: Skin color, texture, turgor normal. No rashes or lesions Lymph nodes: Cervical, supraclavicular, and axillary nodes normal. No  abnormal inguinal nodes palpated Neurologic: Grossly normal   Pelvic: External genitalia:  no lesions              Urethra:  normal appearing urethra with no masses, tenderness or lesions              Bartholins and Skenes: normal                 Vagina: normal appearing vagina with normal color and discharge, no lesions              Cervix: no lesions              Pap taken: Yes.   Bimanual Exam:  Uterus:  normal size, contour, position, consistency, mobility, non-tender              Adnexa: normal adnexa and no mass, fullness, tenderness               Rectovaginal: Confirms               Anus:  normal sphincter tone, no lesions  A:  Well Woman with normal exam  PMP, No HRT  H/O colonic polyps Mitral valve insufficiency  H/O BCC  Insomnia  P: Mammogram yearly.  Doing 3D MMG. Pap neg with HR HPV 4/13.  Pap only today.  Sees derm yearly Ambien 10mg  qhs prn insomnia Estrace 1 gram vaginally twice weekly.  #30gm/2RF return annually or prn  An After Visit Summary was printed and given to the patient.

## 2014-05-25 ENCOUNTER — Telehealth: Payer: Self-pay

## 2014-05-25 NOTE — Telephone Encounter (Signed)
Per DPR ok to leave detailed message on cell#-informed BMD appointment scheduled at [facility] for 05/27/14 at 9:30. Order will be faxed//kn

## 2014-05-26 ENCOUNTER — Telehealth: Payer: Self-pay

## 2014-05-26 LAB — IPS PAP TEST WITH REFLEX TO HPV

## 2014-05-26 NOTE — Telephone Encounter (Signed)
Fax sent to [facility] for BMD which is scheduled for tomorrow at 05/27/14. Fax confirmation confirmed and order sent to scan.  Routing to provider for final review. Patient agreeable to disposition. Will close encounter

## 2014-06-07 ENCOUNTER — Telehealth: Payer: Self-pay | Admitting: *Deleted

## 2014-06-07 NOTE — Telephone Encounter (Signed)
Patient notified of Bone Density results. Verbalized understanding. - Please refer to Bone Density Scan

## 2014-09-08 ENCOUNTER — Other Ambulatory Visit: Payer: Self-pay | Admitting: Gastroenterology

## 2014-09-08 DIAGNOSIS — R1310 Dysphagia, unspecified: Secondary | ICD-10-CM

## 2014-09-13 ENCOUNTER — Ambulatory Visit
Admission: RE | Admit: 2014-09-13 | Discharge: 2014-09-13 | Disposition: A | Discharge status: Home / Self Care | Payer: 59 | Source: Ambulatory Visit | Attending: Gastroenterology | Admitting: Gastroenterology

## 2014-09-13 DIAGNOSIS — R1310 Dysphagia, unspecified: Secondary | ICD-10-CM

## 2014-09-20 ENCOUNTER — Encounter: Payer: Self-pay | Admitting: Obstetrics & Gynecology

## 2014-09-20 ENCOUNTER — Ambulatory Visit: Payer: 59 | Attending: Urology | Admitting: Physical Therapy

## 2014-09-20 DIAGNOSIS — R252 Cramp and spasm: Secondary | ICD-10-CM | POA: Diagnosis not present

## 2014-09-20 DIAGNOSIS — Z5189 Encounter for other specified aftercare: Secondary | ICD-10-CM | POA: Insufficient documentation

## 2014-09-20 DIAGNOSIS — M810 Age-related osteoporosis without current pathological fracture: Secondary | ICD-10-CM | POA: Diagnosis not present

## 2014-09-20 DIAGNOSIS — N3010 Interstitial cystitis (chronic) without hematuria: Secondary | ICD-10-CM | POA: Insufficient documentation

## 2014-09-20 DIAGNOSIS — R102 Pelvic and perineal pain: Secondary | ICD-10-CM | POA: Diagnosis not present

## 2014-09-22 ENCOUNTER — Ambulatory Visit: Payer: 59 | Admitting: Physical Therapy

## 2014-09-22 DIAGNOSIS — Z5189 Encounter for other specified aftercare: Secondary | ICD-10-CM | POA: Diagnosis not present

## 2014-09-27 ENCOUNTER — Ambulatory Visit: Payer: 59 | Admitting: Physical Therapy

## 2014-09-27 DIAGNOSIS — Z5189 Encounter for other specified aftercare: Secondary | ICD-10-CM | POA: Diagnosis not present

## 2014-10-01 ENCOUNTER — Ambulatory Visit: Payer: 59 | Admitting: Physical Therapy

## 2014-10-01 DIAGNOSIS — Z5189 Encounter for other specified aftercare: Secondary | ICD-10-CM | POA: Diagnosis not present

## 2014-10-04 ENCOUNTER — Ambulatory Visit: Payer: 59 | Admitting: Physical Therapy

## 2014-10-04 DIAGNOSIS — Z5189 Encounter for other specified aftercare: Secondary | ICD-10-CM | POA: Diagnosis not present

## 2014-10-07 ENCOUNTER — Ambulatory Visit: Payer: 59 | Admitting: Physical Therapy

## 2014-10-07 DIAGNOSIS — Z5189 Encounter for other specified aftercare: Secondary | ICD-10-CM | POA: Diagnosis not present

## 2014-10-11 ENCOUNTER — Ambulatory Visit: Payer: 59 | Admitting: Physical Therapy

## 2014-10-11 DIAGNOSIS — Z5189 Encounter for other specified aftercare: Secondary | ICD-10-CM | POA: Diagnosis not present

## 2014-10-18 ENCOUNTER — Ambulatory Visit: Payer: 59 | Admitting: Physical Therapy

## 2014-10-18 DIAGNOSIS — Z5189 Encounter for other specified aftercare: Secondary | ICD-10-CM | POA: Diagnosis not present

## 2014-10-21 ENCOUNTER — Encounter: Payer: 59 | Admitting: Physical Therapy

## 2014-10-26 ENCOUNTER — Ambulatory Visit: Payer: 59 | Attending: Urology | Admitting: Physical Therapy

## 2014-10-26 DIAGNOSIS — Z5189 Encounter for other specified aftercare: Secondary | ICD-10-CM | POA: Insufficient documentation

## 2014-10-26 DIAGNOSIS — R102 Pelvic and perineal pain: Secondary | ICD-10-CM | POA: Insufficient documentation

## 2014-10-26 DIAGNOSIS — M810 Age-related osteoporosis without current pathological fracture: Secondary | ICD-10-CM | POA: Insufficient documentation

## 2014-10-26 DIAGNOSIS — R252 Cramp and spasm: Secondary | ICD-10-CM | POA: Insufficient documentation

## 2014-10-26 DIAGNOSIS — N3010 Interstitial cystitis (chronic) without hematuria: Secondary | ICD-10-CM | POA: Insufficient documentation

## 2014-11-01 ENCOUNTER — Ambulatory Visit: Payer: 59 | Admitting: Physical Therapy

## 2014-11-01 DIAGNOSIS — Z5189 Encounter for other specified aftercare: Secondary | ICD-10-CM | POA: Diagnosis not present

## 2014-11-23 ENCOUNTER — Other Ambulatory Visit: Payer: Self-pay | Admitting: Physician Assistant

## 2014-11-23 DIAGNOSIS — J320 Chronic maxillary sinusitis: Secondary | ICD-10-CM

## 2014-11-24 ENCOUNTER — Ambulatory Visit
Admission: RE | Admit: 2014-11-24 | Discharge: 2014-11-24 | Disposition: A | Discharge status: Home / Self Care | Payer: 59 | Source: Ambulatory Visit | Attending: Physician Assistant | Admitting: Physician Assistant

## 2014-11-24 DIAGNOSIS — J320 Chronic maxillary sinusitis: Secondary | ICD-10-CM

## 2015-06-14 ENCOUNTER — Ambulatory Visit: Payer: PRIVATE HEALTH INSURANCE | Admitting: Obstetrics & Gynecology

## 2015-06-14 ENCOUNTER — Encounter: Payer: Self-pay | Admitting: Obstetrics & Gynecology

## 2015-06-14 ENCOUNTER — Ambulatory Visit (INDEPENDENT_AMBULATORY_CARE_PROVIDER_SITE_OTHER): Payer: PRIVATE HEALTH INSURANCE | Admitting: Obstetrics & Gynecology

## 2015-06-14 ENCOUNTER — Other Ambulatory Visit (INDEPENDENT_AMBULATORY_CARE_PROVIDER_SITE_OTHER): Payer: PRIVATE HEALTH INSURANCE

## 2015-06-14 ENCOUNTER — Other Ambulatory Visit: Payer: Self-pay | Admitting: Obstetrics & Gynecology

## 2015-06-14 VITALS — BP 104/70 | HR 68 | Resp 16 | Ht 59.0 in | Wt 102.2 lb

## 2015-06-14 DIAGNOSIS — R140 Abdominal distension (gaseous): Secondary | ICD-10-CM

## 2015-06-14 DIAGNOSIS — R102 Pelvic and perineal pain: Secondary | ICD-10-CM

## 2015-06-14 DIAGNOSIS — R197 Diarrhea, unspecified: Secondary | ICD-10-CM | POA: Diagnosis not present

## 2015-06-14 DIAGNOSIS — R195 Other fecal abnormalities: Secondary | ICD-10-CM

## 2015-06-14 DIAGNOSIS — Z01419 Encounter for gynecological examination (general) (routine) without abnormal findings: Secondary | ICD-10-CM

## 2015-06-14 MED ORDER — ZOLPIDEM TARTRATE 10 MG PO TABS: ORAL_TABLET | ORAL | Status: DC

## 2015-06-14 MED ORDER — ESTRADIOL 0.1 MG/GM VA CREA: TOPICAL_CREAM | VAGINAL | Status: DC

## 2015-06-14 NOTE — Progress Notes (Addendum)
59 y.o. G2P2 MarriedCaucasianF here for annual exam.  Doing well.  No vaginal bleeding.    Reports about a month ago, started having some increased frequency of bowel movements.  Is not diarrhea and isn't loose.  No increased pain.  She has decreased the amount of colace.  Having BMs 3-4 times daily.  Pt did start dexilant a couple a months ago.  Wonders if this is related.  Reviewed Up To Date and diarrhea is reported 5% of the time with dexilant.    PCP:  Dr. Brigitte Pulse.  Has appt in the last summer.  Will do blood work then.    Pt had hx of myofascial pain syndrome related to nerve damage from root canal.  She has done well the last several months.  Accupunture really helped.    Patient's last menstrual period was 11/19/1989.          Sexually active: Yes.    The current method of family planning is post menopausal status.    Exercising: Yes.    cardio, yoga, and weights Smoker:  no  Health Maintenance: Pap:  05/24/14 WNL History of abnormal Pap:  Yes h/o cone biopsy MMG:  12/22/14 3D-BiRads 1 normal Colonoscopy:  2012-repeat in 5 years Dr Collene Mares BMD:   05/27/14-stable (has done reclast and she thinks last one was 2011) TDaP:  2014 Screening Labs: PCP, Hb today: PCP, Urine today: PCP   reports that she has never smoked. She has never used smokeless tobacco. She reports that she drinks about 4.2 - 6.0 oz of alcohol per week. She reports that she does not use illicit drugs.  Past Medical History  Diagnosis Date  . Thyroid disease   . Osteoporosis   . Myofascial pain dysfunction syndrome     Right side  . Hypothyroidism   . Premature menopause   . IC (interstitial cystitis)     Past Surgical History  Procedure Laterality Date  . Cesarean section    . Cholecystectomy    . Tonsillectomy    . Dilation and curettage of uterus    . Cervix lesion destruction    . Root canal  11/13    3 on 2 teeth  . Root canal  04/16/14 and 04/27/14    x2  . Cervical cone biopsy      Family History   Problem Relation Age of Onset  . Alzheimer's disease Mother   . Liver cancer Father   . Other Brother     Good Health  . Cancer Other     ROS:  Pertinent items are noted in HPI.  Otherwise, a comprehensive ROS was negative.  Exam:   BP 104/70 mmHg  Pulse 68  Resp 16  Ht 4\' 11"  (1.499 m)  Wt 102 lb 3.2 oz (46.358 kg)  BMI 20.63 kg/m2  LMP 11/19/1989  Weight change: -3#   Height: 4\' 11"  (149.9 cm)  Ht Readings from Last 3 Encounters:  06/14/15 4\' 11"  (1.499 m)  05/24/14 4' 10.75" (1.492 m)  02/26/13 5' (1.524 m)    General appearance: alert, cooperative and appears stated age Head: Normocephalic, without obvious abnormality, atraumatic Neck: no adenopathy, supple, symmetrical, trachea midline and thyroid normal to inspection and palpation Lungs: clear to auscultation bilaterally Breasts: normal appearance, no masses or tenderness Heart: regular rate and rhythm Abdomen: soft, non-tender; bowel sounds normal; no masses,  no organomegaly Extremities: extremities normal, atraumatic, no cyanosis or edema Skin: Skin color, texture, turgor normal. No rashes or lesions Lymph  nodes: Cervical, supraclavicular, and axillary nodes normal. No abnormal inguinal nodes palpated Neurologic: Grossly normal   Pelvic: External genitalia:  no lesions              Urethra:  normal appearing urethra with no masses, tenderness or lesions              Bartholins and Skenes: normal                 Vagina: normal appearing vagina with normal color and discharge, no lesions              Cervix: no lesions              Pap taken: No. Bimanual Exam:  Uterus:  normal size, contour, position, consistency, mobility, non-tender              Adnexa: normal adnexa and no mass, fullness, tenderness               Rectovaginal: Confirms               Anus:  normal sphincter tone, no lesions  Chaperone was present for exam.  A:  Well Woman with normal exam  PMP, No HRT   H/O colonic polyps Mitral  valve insufficiency  H/O BCC  Change in BM over last month since starting Dexilant Insomnia  P: Mammogram yearly. Doing 3D MMG. Pap neg with HR HPV 4/13. Pap neg 2015.  No pap today. Sees derm yearly--Dr. Allyson Sabal Ambien 10mg  qhs prn insomnia.  #30/1RF  (Pt cuts in 1/4th and uses rarely.) Estrace cream 1 gram vaginally twice weekly. #30gm/2RF  PUS.  Pt will return for this later today. return annually or prn

## 2015-06-14 NOTE — Progress Notes (Signed)
59 y.o. G2P2 Married White female here for a pelvic ultrasound.  Pt seen earlier today.  Pt having issues with change in bowel movements over the past month.  She is having increased number of bowel movements daily.  This may be due to starting Dexilant.  She does have some anxiety about this being a symptom of ovarian cancer.  Exam earlier today was completely normal.    Patient's last menstrual period was 11/19/1989.  FINDINGS: UTERUS: 3.8 x 2.8 x 2.1cm with 1.2cm intramural fibroid EMS: 2.26mm ADNEXA:   Left ovary 2.3 x 0.9 x 0.8cm   Right ovary 2.2 x 1.1 x 1.0cm CUL DE SAC: no free fluid  Findings reviewed with pt.  Ovaries are normal in appearance.  I do not feel her change in GI symptoms are gyn related.  Pt is encouraged to call Dr. Collene Mares just to review if this change really is related to the [place].  Pt does have h/o colon polyps and colonoscopy would be due next year.    Assessment:  Recent change in bowel movements, normal ultrasound today  Plan: Pt will plan to contact Dr. Collene Mares to see if anything else is needed for GI evaluation.  Pt knows to let me know if records need to be transferred.  ~15 minutes spent with patient >50% of time was in face to face discussion of above.  This is in addition to her AEX which was done earlier today.

## 2015-06-26 ENCOUNTER — Encounter: Payer: Self-pay | Admitting: Obstetrics & Gynecology

## 2015-06-26 NOTE — Addendum Note (Signed)
Addended by: Megan Salon on: 06/26/2015 11:00 PM   Modules accepted: Level of Service, SmartSet

## 2015-07-07 ENCOUNTER — Ambulatory Visit (INDEPENDENT_AMBULATORY_CARE_PROVIDER_SITE_OTHER): Payer: 59 | Admitting: Sports Medicine

## 2015-07-07 ENCOUNTER — Encounter: Payer: Self-pay | Admitting: Sports Medicine

## 2015-07-07 VITALS — BP 105/72 | HR 82 | Temp 98.9°F | Ht 59.5 in | Wt 104.0 lb

## 2015-07-07 DIAGNOSIS — R29898 Other symptoms and signs involving the musculoskeletal system: Secondary | ICD-10-CM

## 2015-07-07 DIAGNOSIS — R2991 Unspecified symptoms and signs involving the musculoskeletal system: Secondary | ICD-10-CM | POA: Insufficient documentation

## 2015-07-07 NOTE — Assessment & Plan Note (Addendum)
Findings most consistent with a myofascial hernia.  There doesn't appear to be any tear or effusion  - will provide thigh body helix  - she will continue to work out  - she will f/u in three weeks when Dr. Micheline Chapman and Dr. Oneida Alar are both in clinic in order to repeat US.   Patient seen and evaluated with the above-named resident. I agree with his physical exam and plan of care. Physical exam and ultrasound seem to suggest a very small myofascial herniation of her hamstring. It is nontender. Resolves completely with knee extension and the comes more prominent with resisted knee flexion. I've given her a compression sleeve to wear with exercise and I would like for her to return to the clinic in 3 weeks. I would like to get Dr Oneida Alar' opinion as well.

## 2015-07-07 NOTE — Progress Notes (Signed)
  Kristin Andersen - 59 y.o. female MRN [id number]  Date of birth: 1955-12-28   Kristin Andersen is a 59 y.o. female who presents today for discomfort in her right hamstring.   She has noticed a "lump" in her posterior hamstring for the past month. She feels it more when she is sitting. She does not noticed it when she is walking and there is no pain. She started working out harder for the past six weeks. She denies any injury during this increase in her exercise. She denies any prior injury or surgery to her hamstring. She denies any numbness or tingling.   PMHx - reviewed.  Contributory factors include: plantar fasciitis  Medications - synthroid    ROS Per HPI   Exam:  Filed Vitals:   07/07/15 1108  BP: 105/72  Pulse: 82  Temp: 98.9 F (37.2 C)   Gen: NAD Cardiorespiratory - Normal respiratory effort/rate.   Hamstring Exam:  Laterality: right Appearance: no ecchymosis, when patient is at rest there is no deformity observed. When the patient is in prone position and the knee is held with resisted knee extension, a 3 cm deformity is observed in the posterior hamstring   Edema:none  Tenderness: none Strength:  Quadricep: 5/5 Hamstring: 5/5 Gait: normal Neurovascularly intact      Imaging:  Limited US: right hamstring  A small hernia was observed in the posterior hamstring when patient was in resisted knee extension. There was no tear or partial tear. There was no effusion noted. Findings are suggestive of a myofacial herniation.

## 2015-07-13 ENCOUNTER — Ambulatory Visit: Payer: 59 | Admitting: Sports Medicine

## 2015-08-02 ENCOUNTER — Ambulatory Visit (INDEPENDENT_AMBULATORY_CARE_PROVIDER_SITE_OTHER): Payer: 59 | Admitting: Sports Medicine

## 2015-08-02 ENCOUNTER — Encounter: Payer: Self-pay | Admitting: Sports Medicine

## 2015-08-02 VITALS — BP 105/69 | HR 75 | Ht 59.0 in | Wt 104.0 lb

## 2015-08-02 DIAGNOSIS — G5701 Lesion of sciatic nerve, right lower limb: Secondary | ICD-10-CM | POA: Diagnosis not present

## 2015-08-02 DIAGNOSIS — M722 Plantar fascial fibromatosis: Secondary | ICD-10-CM | POA: Diagnosis not present

## 2015-08-02 DIAGNOSIS — M2020 Hallux rigidus, unspecified foot: Secondary | ICD-10-CM | POA: Diagnosis not present

## 2015-08-02 DIAGNOSIS — R29898 Other symptoms and signs involving the musculoskeletal system: Secondary | ICD-10-CM

## 2015-08-02 DIAGNOSIS — R2991 Unspecified symptoms and signs involving the musculoskeletal system: Secondary | ICD-10-CM

## 2015-08-02 NOTE — Progress Notes (Signed)
Patient ID: CLELLA MCKEEL, female   DOB: August 01, 1956, 59 y.o.   MRN: [id number]  HPI: Ms. Jodee Wagenaar is a 59 year old female presenting for follow-up for a R hamstring myofascial herniation and ultrasound re-evaluation (last seen 07/07/15).  In the interim, she has resumed her regular walking and yoga while wearing thigh compression sleeve.  Still endorses dull ache with knee flexion.  She is now also endorses medial knee pain around and below the joint line, especially with plantarflexion.   She is still unsure of the initial insult leading to pain onset ~2 months ago, but has recently increased her activity within the last few months. Not currently on a therapeutic HEP.  Filed Vitals:   08/02/15 1111  BP: 105/69  Pulse: 75   Physical Exam: General: Female appearing younger than stated age sitting comfortably on exam table in no apparent distress.  Lower extremities:   Slight depression in medial area of R posterior mid-thigh when prone with knees flexed, no edema or ecchymoses.  Area of defect ttp. Slightly decreased hamstrings (with internal and external rotation) and hip adductor strength on right.  5/5 strength R sartorius.      No edema or overlying erythema of R knee. No ttp over joint line of pes anserine bursa.  Full ROM, mild crepitus with knee extension. Lachman's, anterior drawer, and McMurray's negative.  MSK ultrasound:   Longitudinal view: In the area of R semimembranosus (mid-thigh) - oval area (moderate in size) of scar tissue present with mild amount of surrounding edema. With motion: Can appreciate small fascial tear and muscle bulge with edema with disruption of fiber structure.  Horizontal view: In the area of R semimembranosus (mid-thigh) - separation of fascial layers (SM and ST) with surrounding edema.  Minimal/small amount of blood flow to area of prior injury.  Assessment/Plan: Ms. Carmin Alvidrez is a 59 year old female presenting for follow-up for a R  hamstring myofascial herniation and ultrasound re-evaluation, reporting unchanged posterior thigh pain and new onset R knee pain with a non-remarkable knee exam; MSK ultrasound was notable for an old hamstring injury with small fascial separation between the semimembranosus and semitendinosus with muscle bulge, surrounding edema, and disrupted fiber structure with concentric motion.  We suspect she will have continued discomfort until her initial injury fully heals and her R knee pain is associated due to it's location around/below the medial joint line; however, she was not ttp at the pes anserine bursa.  She will likely respond well to conservative therapy as discussed below. - Continue thigh Body Helix with activity - Given heel lifts - HEP: Diver, Glider (modified forward/backward lunges), and Extender exercises - Discussed modified yoga regimen, avoiding poses with extreme knee flexion with weight-bearing - Can continue to walk - Follow up in 6 weeks   * Patient seen in consultation with Dr Andreas Blower

## 2015-08-25 ENCOUNTER — Encounter: Payer: Self-pay | Admitting: Obstetrics & Gynecology

## 2015-09-13 ENCOUNTER — Ambulatory Visit
Admission: RE | Admit: 2015-09-13 | Discharge: 2015-09-13 | Disposition: A | Discharge status: Home / Self Care | Payer: 59 | Source: Ambulatory Visit | Attending: Sports Medicine | Admitting: Sports Medicine

## 2015-09-13 ENCOUNTER — Other Ambulatory Visit: Payer: Self-pay | Admitting: Sports Medicine

## 2015-09-13 ENCOUNTER — Encounter: Payer: Self-pay | Admitting: Sports Medicine

## 2015-09-13 ENCOUNTER — Ambulatory Visit (INDEPENDENT_AMBULATORY_CARE_PROVIDER_SITE_OTHER): Payer: 59 | Admitting: Sports Medicine

## 2015-09-13 VITALS — BP 107/71 | HR 75 | Ht 59.0 in | Wt 104.0 lb

## 2015-09-13 DIAGNOSIS — M1711 Unilateral primary osteoarthritis, right knee: Secondary | ICD-10-CM | POA: Diagnosis not present

## 2015-09-13 DIAGNOSIS — R29898 Other symptoms and signs involving the musculoskeletal system: Secondary | ICD-10-CM | POA: Diagnosis not present

## 2015-09-13 DIAGNOSIS — M25561 Pain in right knee: Secondary | ICD-10-CM | POA: Diagnosis not present

## 2015-09-13 DIAGNOSIS — R2991 Unspecified symptoms and signs involving the musculoskeletal system: Secondary | ICD-10-CM

## 2015-09-13 MED ORDER — DICLOFENAC SODIUM 1 % TD GEL: 2.0000 g | Freq: Three times a day (TID) | TRANSDERMAL | Status: DC | PRN

## 2015-09-13 NOTE — Progress Notes (Signed)
   Subjective:    Patient ID: Kristin Andersen, female    DOB: 1955/12/08, 59 y.o.   MRN: [id number]  HPI   Patient comes in today for follow-up on right hamstring pain. Pain is at a minimum. Previous ultrasound showed a small fascial herniation in the mid body of her hamstring. She has been wearing her body helix compression sleeve and doing her home exercises. Her main complaint today is medial sided knee pain. It has been present for several weeks. Pain is worse with walking. She has not noticed any swelling. No stiffness. No mechanical symptoms. No pain at rest. She is unable to take NSAIDs due to GI problems. No known trauma to the knee. No prior knee surgery. No feelings of instability.    Review of Systems    as above Objective:   Physical Exam  Well-developed, well-nourished. No acute distress. Awake alert and oriented 3.  Right hamstring: Once again appreciated is a small fascial herniation in the mid body of the hamstring. It is nontender. It is unchanged from her previous exam. Good hamstring strength.  Right knee: Full range of motion. No palpable effusion. No significant patellofemoral crepitus. She is tender to palpation along the medial joint line but a negative McMurray's. Negative Thessaly's. No tenderness along the lateral joint line. Good ligamentous stability. No popliteal fossa mass. Neurovascularly intact distally. Walking without a limp.  X-rays of the right knee including AP, lateral, 30 flexion, and sunrise views are obtained. She has a moderate amount of medial compartmental narrowing consistent with medial compartmental DJD.  MSK ultrasound of the right knee was performed. Limited images were obtained. Patient has a small knee effusion. Visualized portion of the medial meniscus shows extrusion of the meniscus from the medial joint space but no discrete tear.      Assessment & Plan:  Improving right hamstring strain with small fascial herniation Right knee  pain secondary to DJD  The patient cannot take oral NSAIDs I will give her a prescription for Voltaren gel to apply 3 times daily to the area of the knee that is most tender. She is using a compression wrap which does seem to help. Given the arthritic changes seen on her x-ray I think she would be an excellent candidate for our knee osteoarthritis study. I will pass her information along to Elbert Ewings and he will contact her with more details.

## 2015-09-21 ENCOUNTER — Ambulatory Visit: Payer: 59 | Attending: Urology | Admitting: Physical Therapy

## 2015-09-21 ENCOUNTER — Encounter: Payer: Self-pay | Admitting: Physical Therapy

## 2015-09-21 DIAGNOSIS — M6289 Other specified disorders of muscle: Secondary | ICD-10-CM

## 2015-09-21 DIAGNOSIS — R102 Pelvic and perineal pain: Secondary | ICD-10-CM

## 2015-09-21 DIAGNOSIS — N8184 Pelvic muscle wasting: Secondary | ICD-10-CM | POA: Diagnosis present

## 2015-09-21 DIAGNOSIS — N949 Unspecified condition associated with female genital organs and menstrual cycle: Secondary | ICD-10-CM | POA: Insufficient documentation

## 2015-09-21 NOTE — Therapy (Signed)
[facility] [address] [place], [place], [address] Phone: [phone]   Fax:  [phone]  Physical Therapy Evaluation  Patient Details  Name: Kristin Andersen MRN: [id number] Date of Birth: 1956/02/21 No Data Recorded  Encounter Date: 09/21/2015      PT End of Session - 09/21/15 1108    Visit Number 1   Date for PT Re-Evaluation 11/16/15   PT Start Time 1103   PT Stop Time 1150   PT Time Calculation (min) 47 min   Activity Tolerance Patient tolerated treatment well   Behavior During Therapy [facility] for tasks assessed/performed      Past Medical History  Diagnosis Date  . Thyroid disease   . Osteoporosis   . Myofascial pain dysfunction syndrome     Right side  . Hypothyroidism   . Premature menopause   . IC (interstitial cystitis)   . Stomach ulcer     Past Surgical History  Procedure Laterality Date  . Cesarean section    . Cholecystectomy    . Tonsillectomy    . Dilation and curettage of uterus    . Cervix lesion destruction    . Root canal  11/13    3 on 2 teeth  . Root canal  04/16/14 and 04/27/14    x2  . Cervical cone biopsy      There were no vitals filed for this visit.  Visit Diagnosis:  Perineal pain in female - Plan: PT plan of care cert/re-cert  Pelvic floor dysfunction - Plan: PT plan of care cert/re-cert      Subjective Assessment - 09/21/15 1109    Subjective Outside of urethra is tender so I wear a thin pantyliner, I started to have a flare-up early October.  A new symptom was itching and burning in the labia area. I am doing the Estrace crean in the vaginal canal and now place it on the labia.    Patient Stated Goals reduce pain.    Currently in Pain? Yes   Pain Score 1   worse is 8/10   Pain Location Abdomen  vagina   Pain Descriptors / Indicators Aching;Cramping   Pain Type Acute pain   Pain Onset More than a month ago   Pain Frequency Intermittent   Aggravating Factors   urinating, clothes   Pain Relieving Factors electrodes and heat, wearing a light day pad, estrogen cream    Multiple Pain Sites No            [facility] PT Assessment - 09/21/15 0001    Assessment   Medical Diagnosis 595.1 Chronic interstitial cystitis, 625.9    Balance Screen   Has the patient fallen in the past 6 months No   Has the patient had a decrease in activity level because of a fear of falling?  No   Is the patient reluctant to leave their home because of a fear of falling?  No   Prior Function   Level of Independence Independent   Cognition   Overall Cognitive Status Within Functional Limits for tasks assessed   Observation/Other Assessments   Focus on Therapeutic Outcomes (FOTO)  12% limitation for pain survey   Strength   Right Hip Flexion 4/5   Right Hip ABduction 4/5   Left Hip Flexion 4/5   Left Hip ABduction 4/5   Left Hip ADduction 4/5   Flexibility   Soft Tissue Assessment /Muscle Length yes   Hamstrings bil. hamstring   Quadriceps  bil. tight   Piriformis bil. tight   Palpation   SI assessment  pelvis in correct alignment   Palpation comment tightness located in lower abdomen and  along lower abdominal scar                 Pelvic Floor Special Questions - 09/21/15 0001    Currently Sexually Active Yes   Marinoff Scale no problems   Urinary Leakage No   Pelvic Floor Internal Exam Patient confirmed identification and approved physical therapist to assess pelvic floor muscle integrity and strength   Exam Type Vaginal   Palpation tenderness located on puborectalis, tightness located on bil. sides of urethra                  PT Education - 09/21/15 1157    Education provided Yes   Education Details hamstring, atnerior tibialis, and piriformis muscle stretch   Person(s) Educated Patient   Methods Explanation;Demonstration;Handout   Comprehension Verbalized understanding;Returned demonstration          PT Short Term Goals -  09/21/15 1211    PT SHORT TERM GOAL #1   Title Independent with flexibility exercises   Time 4   Period Weeks   Status New   PT SHORT TERM GOAL #2   Title pain with urination decreased >/= 25%   Time 4   Period Weeks   Status New   PT SHORT TERM GOAL #3   Title pain with daily activities decreased >/= 25%   Time 4   Period Weeks   Status New           PT Long Term Goals - 09/21/15 1212    PT LONG TERM GOAL #1   Title independent with HEP and how to progress herself   Time 8   Period Weeks   Status New   PT LONG TERM GOAL #2   Title pain with urination decreased >/= 75%   Time 8   Period Weeks   Status New   PT LONG TERM GOAL #3   Title pain with daily activities decreased >/= 75%   Time 8   Period Weeks   Status New   PT LONG TERM GOAL #4   Title Patient understands how to relax her pelvic floor   Time 8   Period Weeks   Status New               Plan - 09/21/15 1201    Clinical Impression Statement Patient is a 59 year old female with diagnosis of chronic interstitial cystitis without hematuria and pelvic pain that flared up in the beginning of October. FOTO score is 12% limitation.  Patient reports her pain is a 1/10 and highest is 8/10.  Patient reports pain with  urination and wearing certian clothes.  Pelvic flloor strength is 5/5.  Palpable tenderness located in bilateral puborectalis and introitus.  Tightness located in bil. sides of urethra, bil. hamstring, bil. quads. Increased tightenss along lower abdominal scar.  Right hip flexion, abduction is 4/5.  Left hip flexion, abduction and adduction is 4/5.  Patient will benefit from physical therapy to reduce pain.    Pt will benefit from skilled therapeutic intervention in order to improve on the following deficits Pain;Decreased strength;Increased fascial restricitons   Rehab Potential Excellent   Clinical Impairments Affecting Rehab Potential None   PT Frequency 1x / week   PT Duration 8 weeks   PT  Treatment/Interventions Electrical Stimulation;Moist Heat;Therapeutic exercise;Therapeutic activities;Cryotherapy;Ultrasound;Neuromuscular  re-education;Patient/family education;Manual techniques   PT Next Visit Plan Soft tissue work, flexibility exercise   PT Home Exercise Plan flexibility    Recommended Other Services None   Consulted and Agree with Plan of Care Patient         Problem List Patient Active Problem List   Diagnosis Date Noted  . Abnormal leg finding 07/07/2015  . Myofascial pain dysfunction syndrome 11/21/2012  . Piriformis syndrome of right side 06/20/2012  . Nonallopathic lesion of lumbosacral region 06/20/2012  . Plantar fasciitis 04/17/2011  . Acquired hallux rigidus 04/17/2011    GRAY,CHERYL,PT 09/21/2015, 12:15 PM  [facility] [address] [place], [place], [address] Phone: [phone]   Fax:  [phone]  Name: Kristin Andersen MRN: [id number] Date of Birth: [birth year]

## 2015-09-21 NOTE — Patient Instructions (Signed)
Piriformis Stretch, Sitting    Sit, one ankle on opposite knee, same-side hand on crossed knee. Push down on knee, keeping spine straight. Lean torso forward, with flat back, until tension is felt in hamstrings and gluteals of crossed-leg side. Hold ___ seconds.  Repeat ___ times per session. Do ___ sessions per day.  Copyright  VHI. All rights reserved.  Chair Sitting    Sit at edge of seat, spine straight, one leg extended. Put a hand on each thigh and bend forward from the hip, keeping spine straight. Allow hand on extended leg to reach toward toes. Support upper body with other arm. Hold _30__ seconds. Repeat _2__ times per session. Do _1__ sessions per day.  Copyright  VHI. All rights reserved.  Foot Flexion / Extension, Standing    Flexion: Place toes curled down on floor. Hold 15 sec 2 times 2 times per day   Copyright  VHI. All rights reserved.  [facility] [address], [place], [place] [address] Phone # [phone] Fax [phone]

## 2015-09-27 ENCOUNTER — Encounter: Payer: 59 | Admitting: Physical Therapy

## 2015-09-29 ENCOUNTER — Encounter: Payer: Self-pay | Admitting: Physical Therapy

## 2015-09-29 ENCOUNTER — Ambulatory Visit: Payer: 59 | Admitting: Physical Therapy

## 2015-09-29 DIAGNOSIS — M6289 Other specified disorders of muscle: Secondary | ICD-10-CM

## 2015-09-29 DIAGNOSIS — N949 Unspecified condition associated with female genital organs and menstrual cycle: Secondary | ICD-10-CM | POA: Diagnosis not present

## 2015-09-29 DIAGNOSIS — R102 Pelvic and perineal pain: Secondary | ICD-10-CM

## 2015-09-29 NOTE — Therapy (Signed)
[facility] [address] [place], [place], [address] Phone: [phone]   Fax:  [phone]  Physical Therapy Treatment  Patient Details  Name: Kristin Andersen MRN: [id number] Date of Birth: 05/03/1956 No Data Recorded  Encounter Date: 09/29/2015      PT End of Session - 09/29/15 1020    Visit Number 2   Date for PT Re-Evaluation 11/16/15   Authorization Type [employer]   Authorization - Visit Number 2   Authorization - Number of Visits 21   PT Start Time 1016   PT Stop Time 1100   PT Time Calculation (min) 44 min   Activity Tolerance Patient tolerated treatment well   Behavior During Therapy [facility] for tasks assessed/performed      Past Medical History  Diagnosis Date  . Thyroid disease   . Osteoporosis   . Myofascial pain dysfunction syndrome     Right side  . Hypothyroidism   . Premature menopause   . IC (interstitial cystitis)   . Stomach ulcer     Past Surgical History  Procedure Laterality Date  . Cesarean section    . Cholecystectomy    . Tonsillectomy    . Dilation and curettage of uterus    . Cervix lesion destruction    . Root canal  11/13    3 on 2 teeth  . Root canal  04/16/14 and 04/27/14    x2  . Cervical cone biopsy      There were no vitals filed for this visit.  Visit Diagnosis:  Perineal pain in female  Pelvic floor dysfunction      Subjective Assessment - 09/29/15 1021    Subjective I have had a few days that were good.    Patient Stated Goals reduce pain.    Currently in Pain? No/denies                      Pelvic Floor Special Questions - 09/29/15 0001    Pelvic Floor Internal Exam Patient confirms identification and approves phsyical therapist to perform soft tissue work   Exam Type Vaginal   Strength strong squeeze, against strong resistance           [facility] Adult PT Treatment/Exercise - 09/29/15 0001    Manual Therapy   Manual Therapy Soft tissue  mobilization;Internal Pelvic Floor   Internal Pelvic Floor bil. piriformis, bil. puborectalis, bil. obturator internist, and introitus                PT Education - 09/29/15 1052    Education provided No          PT Short Term Goals - 09/29/15 1056    PT SHORT TERM GOAL #1   Title Independent with flexibility exercises   Time 4   Period Weeks   Status On-going   PT SHORT TERM GOAL #2   Title pain with urination decreased >/= 25%   Time 4   Period Weeks   Status Achieved  50% better   PT SHORT TERM GOAL #3   Title pain with daily activities decreased >/= 25%   Time 4   Period Weeks   Status Achieved  50% better           PT Long Term Goals - 09/21/15 1212    PT LONG TERM GOAL #1   Title independent with HEP and how to progress herself   Time 8   Period Weeks  Status New   PT LONG TERM GOAL #2   Title pain with urination decreased >/= 75%   Time 8   Period Weeks   Status New   PT LONG TERM GOAL #3   Title pain with daily activities decreased >/= 75%   Time 8   Period Weeks   Status New   PT LONG TERM GOAL #4   Title Patient understands how to relax her pelvic floor   Time 8   Period Weeks   Status New               Plan - 09/29/15 1053    Clinical Impression Statement Patient is a 59 year old female with diagnosis of chronic interstitial cystitis without humaturia and pelvic pain.  Patient has tenderness located in obturator internist  and levator ani. Patient has  met goals #2 and #3.  Patient reports pain with daily activities and urination decreased by 50%.  Patient will benefit from phsyical therapy to reduce pain.    Pt will benefit from skilled therapeutic intervention in order to improve on the following deficits Pain;Decreased strength;Increased fascial restricitons   Rehab Potential Excellent   Clinical Impairments Affecting Rehab Potential None   PT Frequency 1x / week   PT Duration 8 weeks   PT Treatment/Interventions  Electrical Stimulation;Moist Heat;Therapeutic exercise;Therapeutic activities;Cryotherapy;Ultrasound;Neuromuscular re-education;Patient/family education;Manual techniques   PT Next Visit Plan Soft tissue work, flexibility exercise   PT Home Exercise Plan flexibility    Consulted and Agree with Plan of Care Patient        Problem List Patient Active Problem List   Diagnosis Date Noted  . Abnormal leg finding 07/07/2015  . Myofascial pain dysfunction syndrome 11/21/2012  . Piriformis syndrome of right side 06/20/2012  . Nonallopathic lesion of lumbosacral region 06/20/2012  . Plantar fasciitis 04/17/2011  . Acquired hallux rigidus 04/17/2011    Nadiah Corbit,PT 09/29/2015, 10:59 AM  [facility] [address], [place], [place], [address] Phone: [phone]   Fax:  [phone]  Name: Kristin Andersen MRN: [id number] Date of Birth: 1956/02/13

## 2015-10-05 ENCOUNTER — Ambulatory Visit: Payer: 59 | Admitting: Physical Therapy

## 2015-10-05 ENCOUNTER — Encounter: Payer: Self-pay | Admitting: Physical Therapy

## 2015-10-05 DIAGNOSIS — M6289 Other specified disorders of muscle: Secondary | ICD-10-CM

## 2015-10-05 DIAGNOSIS — N949 Unspecified condition associated with female genital organs and menstrual cycle: Secondary | ICD-10-CM

## 2015-10-05 DIAGNOSIS — R102 Pelvic and perineal pain: Secondary | ICD-10-CM

## 2015-10-05 NOTE — Therapy (Signed)
[facility] [address] [place], [place], [address] Phone: [phone]   Fax:  [phone]  Physical Therapy Treatment  Patient Details  Name: Kristin Andersen MRN: [id number] Date of Birth: Nov 15, 1956 No Data Recorded  Encounter Date: 10/05/2015      PT End of Session - 10/05/15 1404    Visit Number 3   Date for PT Re-Evaluation 11/16/15   Authorization Type [employer]   Authorization - Visit Number 3   Authorization - Number of Visits 21   PT Start Time 1324   PT Stop Time 1445   PT Time Calculation (min) 40 min   Activity Tolerance Patient tolerated treatment well   Behavior During Therapy [facility] for tasks assessed/performed      Past Medical History  Diagnosis Date  . Thyroid disease   . Osteoporosis   . Myofascial pain dysfunction syndrome     Right side  . Hypothyroidism   . Premature menopause   . IC (interstitial cystitis)   . Stomach ulcer     Past Surgical History  Procedure Laterality Date  . Cesarean section    . Cholecystectomy    . Tonsillectomy    . Dilation and curettage of uterus    . Cervix lesion destruction    . Root canal  11/13    3 on 2 teeth  . Root canal  04/16/14 and 04/27/14    x2  . Cervical cone biopsy      There were no vitals filed for this visit.  Visit Diagnosis:  Perineal pain in female  Pelvic floor dysfunction      Subjective Assessment - 10/05/15 1405    Subjective I am having a good day. The burning is not as often.  Not daily now.  The vagisil cream has helped.  Patient reports burning decreased by 70%. It happens 2-3 days per week.    Patient Stated Goals reduce pain.    Currently in Pain? No/denies                      Pelvic Floor Special Questions - 10/05/15 0001    Pelvic Floor Internal Exam Patient confirms identification and approves phsyical therapist to perform soft tissue work   Exam Type Vaginal           [facility] Adult PT  Treatment/Exercise - 10/05/15 0001    Manual Therapy   Manual Therapy Soft tissue mobilization;Internal Pelvic Floor   Soft tissue mobilization right hamstring   Internal Pelvic Floor left piriformis, bil. sides of urethra                PT Education - 10/05/15 1441    Education provided Yes   Education Details flexibility exercises   Person(s) Educated Patient   Methods Explanation;Demonstration;Handout   Comprehension Returned demonstration;Verbalized understanding          PT Short Term Goals - 10/05/15 1442    PT SHORT TERM GOAL #1   Title Independent with flexibility exercises   Time 4   Period Weeks   Status Achieved   PT SHORT TERM GOAL #2   Time 4   Period Weeks   Status Achieved   PT SHORT TERM GOAL #3   Title pain with daily activities decreased >/= 25%   Time 4   Period Weeks   Status Achieved           PT Long Term Goals - 09/21/15 1212  PT LONG TERM GOAL #1   Title independent with HEP and how to progress herself   Time 8   Period Weeks   Status New   PT LONG TERM GOAL #2   Title pain with urination decreased >/= 75%   Time 8   Period Weeks   Status New   PT LONG TERM GOAL #3   Title pain with daily activities decreased >/= 75%   Time 8   Period Weeks   Status New   PT LONG TERM GOAL #4   Title Patient understands how to relax her pelvic floor   Time 8   Period Weeks   Status New               Plan - 10/05/15 1443    Clinical Impression Statement Patient is a 59 year old female with diagnosis of chronic interstitial cystitis without humaturia and pelvic pain.  Paitent is not having the burning as often, 2-3x per week now instead of daily.  Patient has tenderness located in right hamstring, left piriformis, and left side of urethra.  Patient hsa met her STGs.  Patient eill benefit from physical theray to reduce pain.    Pt will benefit from skilled therapeutic intervention in order to improve on the following deficits  Pain;Decreased strength;Increased fascial restricitons   Rehab Potential Excellent   Clinical Impairments Affecting Rehab Potential None   PT Frequency 1x / week   PT Duration 8 weeks   PT Treatment/Interventions Electrical Stimulation;Moist Heat;Therapeutic exercise;Therapeutic activities;Cryotherapy;Ultrasound;Neuromuscular re-education;Patient/family education;Manual techniques   PT Next Visit Plan Soft tissue work to pelvic floor and hamstring, flexibility exercise   PT Home Exercise Plan progress as needed   Consulted and Agree with Plan of Care Patient        Problem List Patient Active Problem List   Diagnosis Date Noted  . Abnormal leg finding 07/07/2015  . Myofascial pain dysfunction syndrome 11/21/2012  . Piriformis syndrome of right side 06/20/2012  . Nonallopathic lesion of lumbosacral region 06/20/2012  . Plantar fasciitis 04/17/2011  . Acquired hallux rigidus 04/17/2011    Tyson Masin ,PT  10/05/2015, 2:46 PM  [facility] [address] [place], [place], [address] Phone: [phone]   Fax:  [phone]  Name: Kristin Andersen MRN: [id number] Date of Birth: [birth year]

## 2015-10-05 NOTE — Patient Instructions (Signed)
Hip Adductor: Knee Fall Out (Supine)    Lie on back, knees bent. Place hands on inner thighs and massage inner thigh. Pull legs apart until stretch is felt in inner thigh. Hold _30__ seconds. Relax. Repeat __2_ times. Do __1_ times a day.   Copyright  VHI. All rights reserved.   Cat / Cow Flow    Inhale, press spine toward ceiling like a Halloween cat. Keeping strength in arms and abdominals, exhale to soften spine through neutral and into cow pose. Open chest and arch back. Initiate movement between cat and cow at tailbone, one vertebrae at a time. Repeat __10__ times.  Copyright  VHI. All rights reserved.   BACK: Child's Pose (Sciatica)    Sit in knee-chest position and reach arms forward. Separate knees for comfort. Hold position for 30___ breaths. Repeat _2__ times. Do _1__ times per day.  Copyright  VHI. All rights reserved.  Adductors, Sitting With Hip Flexion    Sit with legs open in a wide V, toes pointing up, hands on knees. Keep spine straight supporting trunk with arms. Slide arms down leg as trunk tips forward. Press knees apart. Hold _30__ seconds. Repeat _2__ times per session. Do __1_ sessions per day.  Copyright  VHI. All rights reserved.   [facility] [address] [place], [place] [address] Phone # [phone] Fax [phone]

## 2015-10-10 ENCOUNTER — Encounter: Payer: Self-pay | Admitting: Physical Therapy

## 2015-10-10 ENCOUNTER — Ambulatory Visit: Payer: 59 | Admitting: Physical Therapy

## 2015-10-10 DIAGNOSIS — M6289 Other specified disorders of muscle: Secondary | ICD-10-CM

## 2015-10-10 DIAGNOSIS — R102 Pelvic and perineal pain unspecified side: Secondary | ICD-10-CM

## 2015-10-10 DIAGNOSIS — N949 Unspecified condition associated with female genital organs and menstrual cycle: Secondary | ICD-10-CM | POA: Diagnosis not present

## 2015-10-10 NOTE — Patient Instructions (Signed)
Chair Sitting    Sit at edge of seat, spine straight, one leg extended. Put a hand on each thigh and bend forward from the hip, keeping spine straight. Allow hand on extended leg to reach toward toes. Support upper body with other arm. Hold _30__ seconds. Repeat _2__ times per session. Do __1_ sessions per day.  Copyright  VHI. All rights reserved.  Quads / HF, Standing    Stand, holding onto chair and grasping one foot with same-side hand. Keep knees close together. Squeeze buttocks. Pull heel toward buttock. Hold _30__ seconds.  Repeat __2_ times per session. Do _1__ sessions per day.  Copyright  VHI. All rights reserved.  [facility] [address] [place],  [address] Phone # [phone] Fax [phone]

## 2015-10-10 NOTE — Therapy (Signed)
[facility] [address], [place], [place], [address] Phone: [phone]   Fax:  [phone]  Physical Therapy Treatment  Patient Details  Name: Kristin Andersen MRN: [id number] Date of Birth: [birth year] No Data Recorded  Encounter Date: 10/10/2015      PT End of Session - 10/10/15 1018    Visit Number 4   Date for PT Re-Evaluation 11/16/15   Authorization Type [employer]ign[employer]   Authorization - Visit Number 4   Authorization - Number of Visits 21   PT Start Time 1015   PT Stop Time 1055   PT Time Calculation (min) 40 min   Activity Tolerance Patient tolerated treatment well   Behavior During Therapy [facility] for tasks assessed/performed      Past Medical History  Diagnosis Date  . Thyroid disease   . Osteoporosis   . Myofascial pain dysfunction syndrome     Right side  . Hypothyroidism   . Premature menopause   . IC (interstitial cystitis)   . Stomach ulcer     Past Surgical History  Procedure Laterality Date  . Cesarean section    . Cholecystectomy    . Tonsillectomy    . Dilation and curettage of uterus    . Cervix lesion destruction    . Root canal  11/13    3 on 2 teeth  . Root canal  04/16/14 and 04/27/14    x2  . Cervical cone biopsy      There were no vitals filed for this visit.  Visit Diagnosis:  Perineal pain in female  Pelvic floor dysfunction      Subjective Assessment - 10/10/15 1019    Subjective I am feeling good.  I had a good week.  I have no pain.  This morning I felt a l little funny when going to the bathroom. I had orange juice yesterday. Itching and burning is better.    Patient Stated Goals reduce pain.    Currently in Pain? No/denies                      Pelvic Floor Special Questions - 10/10/15 0001    Pelvic Floor Internal Exam Patient confirms identification and approves phsyical therapist to perform soft tissue work   Exam Type Vaginal           [facility]  Adult PT Treatment/Exercise - 10/10/15 0001    Manual Therapy   Manual Therapy Soft tissue mobilization;Internal Pelvic Floor   Soft tissue mobilization right hamstring   Internal Pelvic Floor left piriformis, left coccygeus, left iliococcygeus                PT Education - 10/10/15 1056    Education provided Yes   Education Details hamstring and hip flexor stretch   Person(s) Educated Patient   Methods Explanation;Demonstration;Handout   Comprehension Returned demonstration;Verbalized understanding          PT Short Term Goals - 10/05/15 1442    PT SHORT TERM GOAL #1   Title Independent with flexibility exercises   Time 4   Period Weeks   Status Achieved   PT SHORT TERM GOAL #2   Time 4   Period Weeks   Status Achieved   PT SHORT TERM GOAL #3   Title pain with daily activities decreased >/= 25%   Time 4   Period Weeks   Status Achieved           PT  Long Term Goals - 10/10/15 1058    PT LONG TERM GOAL #1   Title independent with HEP and how to progress herself   Time 8   Period Weeks   Status On-going   PT LONG TERM GOAL #2   Title pain with urination decreased >/= 75%   Time 8   Period Weeks   Status Achieved   PT LONG TERM GOAL #3   Title pain with daily activities decreased >/= 75%   Time 8   Period Weeks   Status On-going   PT LONG TERM GOAL #4   Title Patient understands how to relax her pelvic floor   Time 8   Period Weeks   Status On-going               Plan - 10/10/15 1056    Clinical Impression Statement patient is a 59 year old female with diagnosis of chronic interstitial cyctitis without humaturia and pelvic pain. Patient continues to have less pain and less burning.  Patient found out orange juice can be a trigger.  Paitent has not met goasl but coninues to improve.    Pt will benefit from skilled therapeutic intervention in order to improve on the following deficits Pain;Decreased strength;Increased fascial restricitons    Rehab Potential Excellent   Clinical Impairments Affecting Rehab Potential None   PT Frequency 1x / week   PT Duration 8 weeks   PT Treatment/Interventions Electrical Stimulation;Moist Heat;Therapeutic exercise;Therapeutic activities;Cryotherapy;Ultrasound;Neuromuscular re-education;Patient/family education;Manual techniques   PT Next Visit Plan Soft tissue work to pelvic floor and hamstring, flexibility exercise; possible D/c   PT Home Exercise Plan progress as needed   Consulted and Agree with Plan of Care Patient        Problem List Patient Active Problem List   Diagnosis Date Noted  . Abnormal leg finding 07/07/2015  . Myofascial pain dysfunction syndrome 11/21/2012  . Piriformis syndrome of right side 06/20/2012  . Nonallopathic lesion of lumbosacral region 06/20/2012  . Plantar fasciitis 04/17/2011  . Acquired hallux rigidus 04/17/2011    GRAY,CHERYL,PT 10/10/2015, 10:59 AM  [facility] [address] [place], [place], [address] Phone: [phone]   Fax:  [phone]  Name: XOCHILT CONANT MRN: [id number] Date of Birth: Jul 17, 1956

## 2015-10-18 ENCOUNTER — Encounter: Payer: Self-pay | Admitting: Physical Therapy

## 2015-10-18 ENCOUNTER — Ambulatory Visit: Payer: 59 | Admitting: Physical Therapy

## 2015-10-18 DIAGNOSIS — R102 Pelvic and perineal pain unspecified side: Secondary | ICD-10-CM

## 2015-10-18 DIAGNOSIS — N949 Unspecified condition associated with female genital organs and menstrual cycle: Secondary | ICD-10-CM

## 2015-10-18 DIAGNOSIS — M6289 Other specified disorders of muscle: Secondary | ICD-10-CM

## 2015-10-18 NOTE — Therapy (Signed)
[facility] [address] [place], [place], [address] Phone: [phone]   Fax:  [phone]  Physical Therapy Treatment  Patient Details  Name: Kristin Andersen MRN: [id number] Date of Birth: 1956/06/16 No Data Recorded  Encounter Date: 10/18/2015      PT End of Session - 10/18/15 1135    Visit Number 5   Authorization Type Cign[employer]   Authorization - Visit Number 5   Authorization - Number of Visits 21   PT Start Time 1100   PT Stop Time 1138   PT Time Calculation (min) 38 min   Activity Tolerance Patient tolerated treatment well   Behavior During Therapy [facility] for tasks assessed/performed      Past Medical History  Diagnosis Date  . Thyroid disease   . Osteoporosis   . Myofascial pain dysfunction syndrome     Right side  . Hypothyroidism   . Premature menopause   . IC (interstitial cystitis)   . Stomach ulcer     Past Surgical History  Procedure Laterality Date  . Cesarean section    . Cholecystectomy    . Tonsillectomy    . Dilation and curettage of uterus    . Cervix lesion destruction    . Root canal  11/13    3 on 2 teeth  . Root canal  04/16/14 and 04/27/14    x2  . Cervical cone biopsy      There were no vitals filed for this visit.  Visit Diagnosis:  Perineal pain in female  Pelvic floor dysfunction      Subjective Assessment - 10/18/15 1107    Subjective I am not having pelvic floor pain.  The itching is 50% better than last week.    Patient Stated Goals reduce pain.    Currently in Pain? No/denies                         [facility] Adult PT Treatment/Exercise - 10/18/15 0001    Manual Therapy   Manual Therapy Soft tissue mobilization;Internal Pelvic Floor   Soft tissue mobilization right hamstring; left piriformis, left obturator internist, left levator ani                PT Education - 10/18/15 1134    Education provided Yes   Education Details use tennis  ball to roll out  the hamstring where a knot is in   [employer]) Educated Patient   Methods Explanation;Demonstration   Comprehension Verbalized understanding          PT Short Term Goals - 10/05/15 1442    PT SHORT TERM GOAL #1   Title Independent with flexibility exercises   Time 4   Period Weeks   Status Achieved   PT SHORT TERM GOAL #2   Time 4   Period Weeks   Status Achieved   PT SHORT TERM GOAL #3   Title pain with daily activities decreased >/= 25%   Time 4   Period Weeks   Status Achieved           PT Long Term Goals - 10/18/15 1108    PT LONG TERM GOAL #1   Title independent with HEP and how to progress herself   Time 8   Period Weeks   Status On-going   PT LONG TERM GOAL #2   Title pain with urination decreased >/= 75%   Time 8   Period Weeks  PT LONG TERM GOAL #3   Title pain with daily activities decreased >/= 75%   Time 8   Period Weeks   PT LONG TERM GOAL #4   Title Patient understands how to relax her pelvic floor   Time 8   Period Weeks   Status Achieved               Plan - 10/18/15 1135    Clinical Impression Statement Patient is a 59 year old female with diagnosis of chronic interstitial cyctistis without humaturia and pelvic pain. Paitent has a reduced knot in right hamstring. Trigger points in left piriformis, obturator internist externally.  Patient will benefit from physical therapy  to reduce pain.    Pt will benefit from skilled therapeutic intervention in order to improve on the following deficits Pain;Decreased strength;Increased fascial restricitons   Rehab Potential Excellent   Clinical Impairments Affecting Rehab Potential None   PT Frequency 1x / week   PT Duration 8 weeks   PT Treatment/Interventions Electrical Stimulation;Moist Heat;Therapeutic exercise;Therapeutic activities;Cryotherapy;Ultrasound;Neuromuscular re-education;Patient/family education;Manual techniques   PT Next Visit Plan Soft tissue work to pelvic  floor and hamstring, flexibility exercise; possible D/c   PT Home Exercise Plan progress as needed   Consulted and Agree with Plan of Care Patient        Problem List Patient Active Problem List   Diagnosis Date Noted  . Abnormal leg finding 07/07/2015  . Myofascial pain dysfunction syndrome 11/21/2012  . Piriformis syndrome of right side 06/20/2012  . Nonallopathic lesion of lumbosacral region 06/20/2012  . Plantar fasciitis 04/17/2011  . Acquired hallux rigidus 04/17/2011    GRAY,CHERYL,PT 10/18/2015, 11:39 AM  [facility] [address] [place], [place], [address] Phone: [phone]   Fax:  [phone]  Name: Kristin Andersen MRN: [id number] Date of Birth: [birth year]

## 2015-10-26 ENCOUNTER — Encounter: Payer: 59 | Admitting: Physical Therapy

## 2015-11-01 ENCOUNTER — Ambulatory Visit: Payer: 59 | Attending: Urology | Admitting: Physical Therapy

## 2015-11-01 ENCOUNTER — Encounter: Payer: Self-pay | Admitting: Physical Therapy

## 2015-11-01 DIAGNOSIS — R102 Pelvic and perineal pain: Secondary | ICD-10-CM

## 2015-11-01 DIAGNOSIS — M6289 Other specified disorders of muscle: Secondary | ICD-10-CM

## 2015-11-01 DIAGNOSIS — N949 Unspecified condition associated with female genital organs and menstrual cycle: Secondary | ICD-10-CM | POA: Diagnosis present

## 2015-11-01 DIAGNOSIS — N8184 Pelvic muscle wasting: Secondary | ICD-10-CM | POA: Diagnosis present

## 2015-11-01 NOTE — Therapy (Signed)
[facility] [address], [place], [place], [address] Phone: [phone]   Fax:  [phone]  Physical Therapy Treatment  Patient Details  Name: Kristin Andersen MRN: [id number] Date of Birth: 1956-11-13 Referring Provider: Dr. Irine Seal  Encounter Date: 11/01/2015      PT End of Session - 11/01/15 1238    Visit Number 6   Date for PT Re-Evaluation 11/16/15   Authorization Type [employer]   Authorization - Visit Number 6   Authorization - Number of Visits 21   PT Start Time 9741   PT Stop Time 1313   PT Time Calculation (min) 38 min   Activity Tolerance Patient tolerated treatment well   Behavior During Therapy [facility] for tasks assessed/performed      Past Medical History  Diagnosis Date  . Thyroid disease   . Osteoporosis   . Myofascial pain dysfunction syndrome     Right side  . Hypothyroidism   . Premature menopause   . IC (interstitial cystitis)   . Stomach ulcer     Past Surgical History  Procedure Laterality Date  . Cesarean section    . Cholecystectomy    . Tonsillectomy    . Dilation and curettage of uterus    . Cervix lesion destruction    . Root canal  11/13    3 on 2 teeth  . Root canal  04/16/14 and 04/27/14    x2  . Cervical cone biopsy      There were no vitals filed for this visit.  Visit Diagnosis:  Perineal pain in female  Pelvic floor dysfunction      Subjective Assessment - 11/01/15 1245    Subjective No pain   Patient Stated Goals reduce pain.    Currently in Pain? No/denies            [facility] PT Assessment - 11/01/15 0001    Assessment   Medical Diagnosis 595.1 Chronic interstitial cystitis, 625.9    Referring Provider Dr. Irine Seal   Precautions   Precautions None   Balance Screen   Has the patient fallen in the past 6 months No   Has the patient had a decrease in activity level because of a fear of falling?  No   Is the patient reluctant to leave their home  because of a fear of falling?  No   Prior Function   Level of Independence Independent   Cognition   Overall Cognitive Status Within Functional Limits for tasks assessed   Observation/Other Assessments   Focus on Therapeutic Outcomes (FOTO)  0% limitation   Strength   Right Hip Flexion 5/5   Right Hip ABduction 4+/5   Left Hip Flexion 5/5   Left Hip ABduction 5/5   Left Hip ADduction 5/5   Palpation   SI assessment  pelvis in correct alignment                     [facility] Adult PT Treatment/Exercise - 11/01/15 0001    Manual Therapy   Manual Therapy Soft tissue mobilization;Internal Pelvic Floor   Soft tissue mobilization right hamstring; left piriformis, left obturator internist, left levator ani                  PT Short Term Goals - 10/05/15 1442    PT SHORT TERM GOAL #1   Title Independent with flexibility exercises   Time 4   Period Weeks   Status Achieved  PT SHORT TERM GOAL #2   Time 4   Period Weeks   Status Achieved   PT SHORT TERM GOAL #3   Title pain with daily activities decreased >/= 25%   Time 4   Period Weeks   Status Achieved           PT Long Term Goals - 11/01/15 1304    PT LONG TERM GOAL #1   Title independent with HEP and how to progress herself   Time 8   Period Weeks   Status Achieved   PT LONG TERM GOAL #2   Title pain with urination decreased >/= 75%   Time 8   Period Weeks   Status Achieved   PT LONG TERM GOAL #3   Title pain with daily activities decreased >/= 75%   Time 8   Period Weeks   Status Achieved   PT LONG TERM GOAL #4   Title Patient understands how to relax her pelvic floor   Time 8   Period Weeks   Status Achieved               Plan - 11/01/15 1306    Clinical Impression Statement Patient is a 59 year old female with diagnosis fo chronic interstitial cycstitis without humaturia and pelvic pain.  Patient FOT score increased to 0% limitation. Patient reports her pain is 100% better.   Patient has met all of her goals.  Patient hamstring knot reduced by 95%.  Patient is independent with HEP.    Pt will benefit from skilled therapeutic intervention in order to improve on the following deficits Pain;Decreased strength;Increased fascial restricitons   Rehab Potential Excellent   Clinical Impairments Affecting Rehab Potential None   PT Treatment/Interventions Electrical Stimulation;Moist Heat;Therapeutic exercise;Therapeutic activities;Cryotherapy;Ultrasound;Neuromuscular re-education;Patient/family education;Manual techniques   PT Next Visit Plan DIscharge to HEP   PT Home Exercise Plan current HEP   Consulted and Agree with Plan of Care Patient        Problem List Patient Active Problem List   Diagnosis Date Noted  . Abnormal leg finding 07/07/2015  . Myofascial pain dysfunction syndrome 11/21/2012  . Piriformis syndrome of right side 06/20/2012  . Nonallopathic lesion of lumbosacral region 06/20/2012  . Plantar fasciitis 04/17/2011  . Acquired hallux rigidus 04/17/2011   Earlie Counts, PT 11/01/2015 1:10 PM   [facility] [address] [place], [place], [address] Phone: [phone]   Fax:  [phone]  Name: Kristin Andersen MRN: [id number] Date of Birth: 04/06/1956   PHYSICAL THERAPY DISCHARGE SUMMARY  Visits from Start of Care: 6  Current functional level related to goals / functional outcomes See above   Remaining deficits: See above   Education / Equipment: HEP Plan: Patient agrees to discharge.  Patient goals were met. Patient is being discharged due to meeting the stated rehab goals. Thank you for the referral. Earlie Counts, PT 11/01/2015 1:10 PM   ?????

## 2016-04-24 ENCOUNTER — Encounter: Payer: Self-pay | Admitting: Family Medicine

## 2016-04-24 ENCOUNTER — Ambulatory Visit
Admission: RE | Admit: 2016-04-24 | Discharge: 2016-04-24 | Disposition: A | Discharge status: Home / Self Care | Payer: 59 | Source: Ambulatory Visit | Attending: Family Medicine | Admitting: Family Medicine

## 2016-04-24 ENCOUNTER — Ambulatory Visit (INDEPENDENT_AMBULATORY_CARE_PROVIDER_SITE_OTHER): Payer: 59 | Admitting: Family Medicine

## 2016-04-24 VITALS — BP 100/60 | Ht 59.5 in | Wt 100.0 lb

## 2016-04-24 DIAGNOSIS — M545 Low back pain, unspecified: Secondary | ICD-10-CM

## 2016-04-24 MED ORDER — METHOCARBAMOL 750 MG PO TABS: 750.0000 mg | ORAL_TABLET | Freq: Three times a day (TID) | ORAL | Status: DC

## 2016-04-24 MED ORDER — PREDNISONE 10 MG PO TABS: ORAL_TABLET | ORAL | Status: DC

## 2016-04-25 ENCOUNTER — Encounter: Payer: Self-pay | Admitting: Family Medicine

## 2016-04-25 NOTE — Progress Notes (Signed)
Kristin Andersen - 60 y.o. female MRN [id number]  Date of birth: 22-Jan-1956  CC: Low back pain  SUBJECTIVE:   HPI  Kristin Andersen is a very pleasant 60 year old female with osteoporosis who is experiencing 4 days of low back pain. She denies any falls. She states she was bending over a sink to open the window and felt a sharp twinge in her low back. She denies any other injuries. She is concerned because she is going to the beach with her grandchildren in 3 or 4 days and wants to be able to participate. She is doing significantly better, roughly 50% better than previously. As listed above she does have a diagnosis of osteoporosis and was on Reclast several times. She has tried both icing and heating the area. She is not supposed to take NSAIDs due to GERD but did taken several Advil and reports feeling well. She is on a PPI. She has found that activity seems to make it better. In and out of a chair or car is the most painful. She has no pain in the laying down. She is also dealing with laryngitis at the moment. She denies any bowel or bladder dysfunction, groin numbness, or lower extremity weakness or paresthesias.  ROS:     14 point review of systems negative other than that listed above in history of present illness regards to musculoskeletal issues. No fevers chills or night sweats. No joint swelling or new rashes.  HISTORY: Past Medical, Surgical, Social, and Family History Reviewed & Updated per EMR.  Pertinent Historical Findings include: History of various musculoskeletal injuries, osteoporosis, gastric esophageal reflux disease, insomnia  OBJECTIVE: BP 100/60 mmHg  Ht 4' 11.5" (1.511 m)  Wt 100 lb (45.36 kg)  BMI 19.87 kg/m2  LMP 11/19/1989  Physical Exam  Calm, no acute distress Nonlabored breathing  Back Exam: Inspection: symmetric, paraspinal muscular spasm Motion: full with terminal flexion/rotation discomfort SLR seated: neg                         SLR lying: neg XSLR seated:  neg                        XSLR lying: neg Palpable tenderness: generalized, tender to palpation over bilateral paraspinal muscles more so than the spinous processes. Sensory change: neg Reflex change: neg  Strength at foot Plantar-flexion:  5/ 5    Dorsi-flexion:  5/ 5    Eversion:  5/ 5   Inversion:  5/ 5 Leg strength Quad: 5 / 5   Hamstring: 5 / 5   Hip flexor: 5 / 5    Gait Walking: minimally guarded         Heels:  able         Toes:  able            MEDICATIONS, LABS & OTHER ORDERS: Previous Medications   AZELASTINE (ASTELIN) 137 MCG/SPRAY NASAL SPRAY    Place 1 spray into the nose at bedtime as needed for rhinitis. Use in each nostril as directed   CALCIUM CARBONATE-VIT D-MIN (CALCIUM 1200 PO)    Take by mouth daily.   CETIRIZINE (ZYRTEC) 10 MG TABLET    Take 10 mg by mouth daily.   CHOLECALCIFEROL (VITAMIN D PO)    Take 5,000 Units by mouth daily. calcium   DEXILANT 60 MG CAPSULE    TK 1 C PO QD AS DIRECTED   DICLOFENAC SODIUM (  VOLTAREN) 1 % GEL    Apply 2 g topically 3 (three) times daily as needed.   DOCUSATE SODIUM (COLACE PO)    Take by mouth.   EPIPEN 2-PAK 0.3 MG/0.3ML SOAJ INJECTION    INJECT AS DIRECTED AS NEEDED   ESTRADIOL (ESTRACE) 0.1 MG/GM VAGINAL CREAM    1 gram vaginally twice weekly   FLUARIX QUADRIVALENT 0.5 ML INJECTION    Inject 0.5 mLs into the muscle once.   FLUCONAZOLE (DIFLUCAN) 150 MG TABLET    TK 1 T PO 1 TIME ONLY   FLUOROMETHOLONE (FML) 0.1 % OPHTHALMIC SUSPENSION    INSTILL 1 GTT INTO OS AS DIRECTED   LEVOTHYROXINE (SYNTHROID, LEVOTHROID) 50 MCG TABLET       LEVOTHYROXINE SODIUM (SYNTHROID PO)    Take 50 mcg by mouth daily.    MAGNESIUM PO    Take by mouth daily.   METH-HYO-M BL-NA PHOS-PH SAL (URIBEL) 118 MG CAPS    TK 1 C PO QID PRF BURNING   PROBIOTIC PRODUCT (ALIGN) 4 MG CAPS    Take 1 capsule by mouth daily.   ZOLPIDEM (AMBIEN) 10 MG TABLET    TAKE 1 TABLET BY MOUTH AT BEDTIME AS NEEDED FOR SLEEP   Modified Medications   No medications on  file   New Prescriptions   METHOCARBAMOL (ROBAXIN-750) 750 MG TABLET    Take 1 tablet (750 mg total) by mouth 3 (three) times daily.   PREDNISONE (DELTASONE) 10 MG TABLET    Take as directed   Discontinued Medications   No medications on file   Orders Placed This Encounter  Procedures  . DG Lumbar Spine 2-3 Views   ASSESSMENT & PLAN: Low back pain in a 60 year old with osteoporosis: She is doing quite a bit better. She has no radicular/red flag symptoms. Unable to take NSAIDs due to GERD. We are going to put her on a prednisone Dosepak. She is also taking a PPI. She will continue to be active. We have also provided her with Robaxin if needed. Hopefully she is able to go to the beach with her grandchildren. Lastly we will get an x-ray considering her osteoporotic nature. We will see her back as needed but I'm hopeful that this is a muscular strain that will continue to improve gradually.

## 2016-05-15 ENCOUNTER — Encounter: Payer: Self-pay | Admitting: Family Medicine

## 2016-05-15 ENCOUNTER — Ambulatory Visit (INDEPENDENT_AMBULATORY_CARE_PROVIDER_SITE_OTHER): Payer: 59 | Admitting: Family Medicine

## 2016-05-15 VITALS — BP 104/67 | Ht 59.5 in | Wt 100.0 lb

## 2016-05-15 DIAGNOSIS — G5701 Lesion of sciatic nerve, right lower limb: Secondary | ICD-10-CM

## 2016-05-15 MED ORDER — CYCLOBENZAPRINE HCL 5 MG PO TABS: 5.0000 mg | ORAL_TABLET | Freq: Every day | ORAL | Status: DC

## 2016-05-15 MED ORDER — TRAMADOL HCL 50 MG PO TABS: 50.0000 mg | ORAL_TABLET | Freq: Two times a day (BID) | ORAL | Status: DC | PRN

## 2016-05-15 NOTE — Patient Instructions (Signed)
Piriformis stretches several times a day (cross over stretch, bending forward, hip adduction stretch).   Deep massage.   Flexeril as needed  Tramadol PRN.

## 2016-05-17 NOTE — Progress Notes (Signed)
Kristin [place] - 60 y.o. female MRN [id number]  Date of birth: [birth year]  CC: Low back pain  SUBJECTIVE:   HPI  04/24/2016 Visit: Kristin Andersen is a very pleasant 60 year old female with osteoporosis who is experiencing 4 days of low back pain. She denies any falls. She states she was bending over a sink to open the window and felt a sharp twinge in her low back. She denies any other injuries. She is concerned because she is going to the beach with her grandchildren in 3 or 4 days and wants to be able to participate. She is doing significantly better, roughly 50% better than previously. As listed above she does have a diagnosis of osteoporosis and was on Reclast several times. She has tried both icing and heating the area. She is not supposed to take NSAIDs due to GERD but did taken several Advil and reports feeling well. She is on a PPI. She has found that activity seems to make it better. In and out of a chair or car is the most painful. She has no pain in the laying down. She is also dealing with laryngitis at the moment. She denies any bowel or bladder dysfunction, groin numbness, or lower extremity weakness or paresthesias.  Today's visit, 05/15/2016:  [name] did do well with the prednisone and muscle relaxer. She was feeling almost pain free at the beach. She had some toe pain for 5 days that she hasn't experienced before, but that is now resolved. Last week she began having new onset gluteal pain on the right.  NO trauma.  This is different from her previous back pain.  Occasional radiating pain down leg. No bowel or bladder dysfunction.  No LE weakness. She was on a long care ride when this began.  Squatting makes it worse.    ROS:     14 point review of systems negative other than that listed above in history of present illness regards to musculoskeletal issues. No fevers chills or night sweats. No joint swelling or new rashes.  HISTORY: Past Medical, Surgical, Social, and Family  History Reviewed & Updated per EMR.  Pertinent Historical Findings include: History of various musculoskeletal injuries, osteoporosis, gastric esophageal reflux disease, insomnia  Data Review:  XR of the lumbar spine dated 04/24/2016 is unremarkable.    OBJECTIVE: BP 104/67 mmHg  Ht 4' 11.5" (1.511 m)  Wt 100 lb (45.36 kg)  BMI 19.87 kg/m2  LMP 11/19/1989  Physical Exam  Calm, no acute distress Nonlabored breathing  Back Exam: Inspection: symmetric.  Back is Non-TTP Motion: full with terminal flexion/rotation discomfort SLR seated: neg                         SLR lying: neg XSLR seated: neg                        XSLR lying: neg Sensory change: neg Reflex change: neg  Hip: ROM IR: 80 Deg, ER: 80 Deg, Flexion: 120 Deg, Extension: 100 Deg, Abduction: 45 Deg, Adduction: 45 Deg Strength IR: 5/5, ER: 5/5, Flexion: 5/5, Extension: 5/5, Abduction: 5/5, Adduction: 5/5  Standing hip rotation and gait without trendelenburg / unsteadiness. Greater trochanter without tenderness to palpation. Tender over piriformis.  + piriformis stretch.   No SI joint tenderness and normal minimal SI movement.  Strength at foot Plantar-flexion:  5/ 5    Dorsi-flexion:  5/ 5    Eversion:  5/  5   Inversion:  5/ 5 Leg strength Quad: 5 / 5   Hamstring: 5 / 5   Hip flexor: 5 / 5    Gait Walking: minimally guarded         Heels:  able         Toes:  able            MEDICATIONS, LABS & OTHER ORDERS: Previous Medications   AZELASTINE (ASTELIN) 137 MCG/SPRAY NASAL SPRAY    Place 1 spray into the nose at bedtime as needed for rhinitis. Use in each nostril as directed   CALCIUM CARBONATE-VIT D-MIN (CALCIUM 1200 PO)    Take by mouth daily.   CETIRIZINE (ZYRTEC) 10 MG TABLET    Take 10 mg by mouth daily.   CHOLECALCIFEROL (VITAMIN D PO)    Take 5,000 Units by mouth daily. calcium   DEXILANT 60 MG CAPSULE    TK 1 C PO QD AS DIRECTED   DICLOFENAC SODIUM (VOLTAREN) 1 % GEL    Apply 2 g topically 3 (three) times  daily as needed.   DOCUSATE SODIUM (COLACE PO)    Take by mouth.   EPIPEN 2-PAK 0.3 MG/0.3ML SOAJ INJECTION    INJECT AS DIRECTED AS NEEDED   ESTRADIOL (ESTRACE) 0.1 MG/GM VAGINAL CREAM    1 gram vaginally twice weekly   FLUARIX QUADRIVALENT 0.5 ML INJECTION    Inject 0.5 mLs into the muscle once.   FLUCONAZOLE (DIFLUCAN) 150 MG TABLET    TK 1 T PO 1 TIME ONLY   FLUOROMETHOLONE (FML) 0.1 % OPHTHALMIC SUSPENSION    INSTILL 1 GTT INTO OS AS DIRECTED   LEVOTHYROXINE (SYNTHROID, LEVOTHROID) 50 MCG TABLET       LEVOTHYROXINE SODIUM (SYNTHROID PO)    Take 50 mcg by mouth daily.    MAGNESIUM PO    Take by mouth daily.   METH-HYO-M BL-NA PHOS-PH SAL (URIBEL) 118 MG CAPS    TK 1 C PO QID PRF BURNING   METHOCARBAMOL (ROBAXIN-750) 750 MG TABLET    Take 1 tablet (750 mg total) by mouth 3 (three) times daily.   PREDNISONE (DELTASONE) 10 MG TABLET    Take as directed   PROBIOTIC PRODUCT (ALIGN) 4 MG CAPS    Take 1 capsule by mouth daily.   ZOLPIDEM (AMBIEN) 10 MG TABLET    TAKE 1 TABLET BY MOUTH AT BEDTIME AS NEEDED FOR SLEEP   Modified Medications   No medications on file   New Prescriptions   CYCLOBENZAPRINE (FLEXERIL) 5 MG TABLET    Take 1 tablet (5 mg total) by mouth at bedtime.   TRAMADOL (ULTRAM) 50 MG TABLET    Take 1 tablet (50 mg total) by mouth every 12 (twelve) hours as needed.   Discontinued Medications   No medications on file   No orders of the defined types were placed in this encounter.   ASSESSMENT & PLAN: Piriformis syndrome: She had been doing better in regards to her back until her right glute began bothering her.  Her back feels fine.  She has no radicular/red flag symptoms. Unable to take NSAIDs due to GERD. We will help her pain with tramadol and flexeril.   XR of back look good.   We discussed importance of stretching, which we demonstrated to her.  F/u 4-6 weeks and consider u/s +/- injection at that time.

## 2016-08-02 ENCOUNTER — Other Ambulatory Visit: Payer: Self-pay | Admitting: Obstetrics & Gynecology

## 2016-08-02 NOTE — Telephone Encounter (Signed)
Medication refill request: Estrace Vaginal Cream Last AEX:  06/14/15 SM Next AEX: 09/18/16 Last MMG (if hormonal medication request): 12/28/15 BIRADS1 negative Refill authorized: 06/14/15 #30g w/3 refills today #45g w/0 refills?

## 2016-09-18 ENCOUNTER — Encounter: Payer: Self-pay | Admitting: Obstetrics & Gynecology

## 2016-09-18 ENCOUNTER — Ambulatory Visit (INDEPENDENT_AMBULATORY_CARE_PROVIDER_SITE_OTHER): Payer: PRIVATE HEALTH INSURANCE | Admitting: Obstetrics & Gynecology

## 2016-09-18 VITALS — BP 92/56 | HR 64 | Resp 12 | Ht 58.75 in | Wt 102.4 lb

## 2016-09-18 DIAGNOSIS — Z124 Encounter for screening for malignant neoplasm of cervix: Secondary | ICD-10-CM | POA: Diagnosis not present

## 2016-09-18 DIAGNOSIS — Z0000 Encounter for general adult medical examination without abnormal findings: Secondary | ICD-10-CM

## 2016-09-18 DIAGNOSIS — Z01419 Encounter for gynecological examination (general) (routine) without abnormal findings: Secondary | ICD-10-CM

## 2016-09-18 LAB — POCT URINALYSIS DIPSTICK
BILIRUBIN UA: NEGATIVE
Blood, UA: NEGATIVE
GLUCOSE UA: NEGATIVE
Ketones, UA: NEGATIVE
LEUKOCYTES UA: NEGATIVE
NITRITE UA: NEGATIVE
Protein, UA: NEGATIVE
UROBILINOGEN UA: NEGATIVE
pH, UA: 5

## 2016-09-18 MED ORDER — ESTRADIOL 0.1 MG/GM VA CREA: TOPICAL_CREAM | VAGINAL | 3 refills | Status: DC

## 2016-09-18 MED ORDER — ZOLPIDEM TARTRATE 10 MG PO TABS: ORAL_TABLET | ORAL | 1 refills | Status: DC

## 2016-09-18 NOTE — Progress Notes (Signed)
60 y.o. G2P2 MarriedCaucasianF here for annual exam.  Doing well.  Had some back issues this past year.  Saw sports medicine, Dr. Jimmye Norman, for this.     Did pelvic PT last year.  Saw Earlie Counts last year and this has really helped.    Denies vaginal bleeding.    Patient's last menstrual period was 11/19/1989.          Sexually active: Yes.    The current method of family planning is post menopausal status.    Exercising: Yes.    walking Smoker:  no  Health Maintenance: Pap:  05/24/14 negative History of abnormal Pap:  yes MMG:  12/28/15 BIRADS 1 negative  Colonoscopy:  2012 normal- repeat 5 years, has colonoscopy scheduled for 09/24/16 BMD:   05/27/14 significant osteopenia  TDaP:  2014  Pneumonia vaccine(s):  never Zostavax:   Wanting to get.  Will wait for new shingles vaccines--Shingrix. Hep C testing: will discuss with PCP  Screening Labs: PCP, Hb today: PCP, Urine today: normal    reports that she has never smoked. She has never used smokeless tobacco. She reports that she drinks about 4.2 - 6.0 oz of alcohol per week . She reports that she does not use drugs.  Past Medical History:  Diagnosis Date  . Hypothyroidism   . IC (interstitial cystitis)   . Myofascial pain dysfunction syndrome    Right side  . Osteoporosis   . Premature menopause   . Stomach ulcer   . Thyroid disease     Past Surgical History:  Procedure Laterality Date  . CERVICAL CONE BIOPSY    . CERVIX LESION DESTRUCTION    . CESAREAN SECTION    . CHOLECYSTECTOMY    . DILATION AND CURETTAGE OF UTERUS    . ROOT CANAL  11/13   3 on 2 teeth  . ROOT CANAL  04/16/14 and 04/27/14   x2  . TONSILLECTOMY      Current Outpatient Prescriptions  Medication Sig Dispense Refill  . Calcium Carbonate-Vit D-Min (CALCIUM 1200 PO) Take by mouth daily.    . cetirizine (ZYRTEC) 10 MG tablet Take 10 mg by mouth daily.    Marland Kitchen DEXILANT 60 MG capsule TK 1 C PO QD AS DIRECTED  12  . Docusate Sodium (COLACE PO) Take by mouth.     . EPIPEN 2-PAK 0.3 MG/0.3ML SOAJ injection INJECT AS DIRECTED AS NEEDED  1  . ESTRACE VAGINAL 0.1 MG/GM vaginal cream USE 1 GRAM VAGINALLY 2 TIMES A WEEK 42.5 g 0  . fluorometholone (FML) 0.1 % ophthalmic suspension INSTILL 1 GTT INTO OS AS DIRECTED  1  . Levothyroxine Sodium (SYNTHROID PO) Take 50 mcg by mouth daily.     . Meth-Hyo-M Bl-Na Phos-Ph Sal (URIBEL) 118 MG CAPS TK 1 C PO QID PRF BURNING  11  . XIIDRA 5 % SOLN     . zolpidem (AMBIEN) 10 MG tablet TAKE 1 TABLET BY MOUTH AT BEDTIME AS NEEDED FOR SLEEP 30 tablet 1  . Cholecalciferol (VITAMIN D PO) Take 5,000 Units by mouth daily. calcium    . MAGNESIUM PO Take by mouth daily.    . Probiotic Product (ALIGN) 4 MG CAPS Take 1 capsule by mouth daily.     No current facility-administered medications for this visit.     Family History  Problem Relation Age of Onset  . Alzheimer's disease Mother   . Liver cancer Father   . Other Brother     Good Health  .  Cancer Other     ROS:  Pertinent items are noted in HPI.  Otherwise, a comprehensive ROS was negative.  Exam:   BP (!) 92/56 (BP Location: Right Arm, Patient Position: Sitting, Cuff Size: Normal)   Pulse 64   Resp 12   Ht 4' 10.75" (1.492 m)   Wt 102 lb 6.4 oz (46.4 kg)   LMP 11/19/1989   BMI 20.86 kg/m   Weight change: stable    Height: 4' 10.75" (149.2 cm)  Ht Readings from Last 3 Encounters:  09/18/16 4' 10.75" (1.492 m)  05/15/16 4' 11.5" (1.511 m)  04/24/16 4' 11.5" (1.511 m)   General appearance: alert, cooperative and appears stated age Head: Normocephalic, without obvious abnormality, atraumatic Neck: no adenopathy, supple, symmetrical, trachea midline and thyroid normal to inspection and palpation Lungs: clear to auscultation bilaterally Breasts: normal appearance, no masses or tenderness Heart: regular rate and rhythm Abdomen: soft, non-tender; bowel sounds normal; no masses,  no organomegaly Extremities: extremities normal, atraumatic, no cyanosis or  edema Skin: Skin color, texture, turgor normal. No rashes or lesions Lymph nodes: Cervical, supraclavicular, and axillary nodes normal. No abnormal inguinal nodes palpated Neurologic: Grossly normal   Pelvic: External genitalia:  no lesions              Urethra:  normal appearing urethra with no masses, tenderness or lesions              Bartholins and Skenes: normal                 Vagina: normal appearing vagina with normal color and discharge, no lesions              Cervix: no lesions              Pap taken: Yes.   Bimanual Exam:  Uterus:  normal size, contour, position, consistency, mobility, non-tender              Adnexa: normal adnexa and no mass, fullness, tenderness               Rectovaginal: Confirms               Anus:  normal sphincter tone, no lesions  Chaperone was present for exam.  A:     Well Woman with normal exam  PMP, No HRT   H/O colonic polyps Mitral valve insufficiency  H/O BCC  Insomnia  P: Mammogram yearly. Doing 3D MMG. Pap neg with HR HPV 4/13. Pap and HR HPV today.  Sees Dr. Allyson Sabal yearly Ambien 10mg  qhs prn insomnia.  #30/1RF  (Pt cuts in 1/4th and uses rarely.) Estrace cream 1 gram vaginally twice weekly.  #30gm/2RF  Return annually or prn

## 2016-09-18 NOTE — Patient Instructions (Signed)
Please do BMD with your mammogram in February.

## 2016-09-20 LAB — IPS PAP TEST WITH HPV

## 2017-02-14 ENCOUNTER — Encounter: Payer: Self-pay | Admitting: Obstetrics & Gynecology

## 2017-02-14 ENCOUNTER — Telehealth: Payer: Self-pay | Admitting: *Deleted

## 2017-02-14 NOTE — Telephone Encounter (Signed)
Call to patient. BMD results given to patient and she verbalized understanding. Consult scheduled for Monday 02/25/17 at 1500. Patient agreeable to date and time of appointment.   Routing to provider for final review. Patient agreeable to disposition. Will close encounter.

## 2017-02-25 ENCOUNTER — Ambulatory Visit (INDEPENDENT_AMBULATORY_CARE_PROVIDER_SITE_OTHER): Payer: PRIVATE HEALTH INSURANCE | Admitting: Obstetrics & Gynecology

## 2017-02-25 ENCOUNTER — Encounter: Payer: Self-pay | Admitting: Obstetrics & Gynecology

## 2017-02-25 VITALS — BP 110/60 | HR 84 | Resp 16 | Wt 100.6 lb

## 2017-02-25 DIAGNOSIS — M810 Age-related osteoporosis without current pathological fracture: Secondary | ICD-10-CM | POA: Diagnosis not present

## 2017-02-25 LAB — COMPREHENSIVE METABOLIC PANEL
ALK PHOS: 50 U/L (ref 33–130)
ALT: 11 U/L (ref 6–29)
AST: 21 U/L (ref 10–35)
Albumin: 4.5 g/dL (ref 3.6–5.1)
BILIRUBIN TOTAL: 0.3 mg/dL (ref 0.2–1.2)
BUN: 16 mg/dL (ref 7–25)
CO2: 27 mmol/L (ref 20–31)
CREATININE: 0.71 mg/dL (ref 0.50–0.99)
Calcium: 9.6 mg/dL (ref 8.6–10.4)
Chloride: 104 mmol/L (ref 98–110)
Glucose, Bld: 93 mg/dL (ref 65–99)
POTASSIUM: 4.6 mmol/L (ref 3.5–5.3)
SODIUM: 140 mmol/L (ref 135–146)
TOTAL PROTEIN: 7.6 g/dL (ref 6.1–8.1)

## 2017-02-25 LAB — PHOSPHORUS: Phosphorus: 3.4 mg/dL (ref 2.5–4.5)

## 2017-02-25 LAB — TSH: TSH: 1.08 mIU/L

## 2017-02-25 MED ORDER — LEVOFLOXACIN 750 MG PO TABS: 750.0000 mg | ORAL_TABLET | Freq: Every day | ORAL | 0 refills | Status: DC

## 2017-02-25 NOTE — Progress Notes (Signed)
Patient ID: Kristin Andersen, female   DOB: Jun 04, 1956, 61 y.o.   MRN: [id number]  Subjective:    6 yrs Married Caucasian G2P2  female here to discuss recent BMD obtained 02/08/17 showing osteoporosis in both hips and spine with T scores of -3.1 and -3.0 (decreased from -2.3 and -2.4) and -2.8 decreased from -1.2 in the spine.  Reviewed results with pt and compared to prior BMD in 2015.   Pt reports she doesn't feel good today.  Has sinusitis about two months ago.  Was having tooth pain and went to see endodontist thinking she needed a root canal.  Was several days before she was seen.  Turned out it was just a significant sinusitis--because has gone for several days without treatment.  Pt reports symptoms started, again, last night and she is having some mild tooth pain.  She also has a low grade temp.  Denies chills.    Osteoporosis Risk Factors  Nonmodifiable Personal Hx of fracture as an adult: no Hx of fracture in first-degree relative: yes - mother aged 87--femur fracture from fall but had dementia at that time as well Dementia: no Poor health/frailty: no  Potentially modifiable: Tobacco use: no Low body weight (<127 lbs): yes Estrogen deficiency  early menopause (age <45) or bilateral ovariectomy: yes Low calcium intake (lifelong): intermittent calcium supplementation depending on GI issues Alcohol use more than 2 drinks per day: no Recurrent falls: no Inadequate physical activity: walks regularly, has hamstring injury so not doing as much or going as long as in the past  Current calcium and Vit D intake: none currently due to recent issues with diarrhea related to medications for reflux  Review of Systems A comprehensive review of systems was negative except for: sinus pressure, low grade temp, tooth pain     Objective:   PHYSICAL EXAM BP 110/60 (BP Location: Right Arm, Patient Position: Sitting, Cuff Size: Normal)   Pulse 84   Resp 16   Wt 100 lb 9.6 oz (45.6 kg)   LMP  11/19/1989   BMI 20.49 kg/m  General appearance: alert, cooperative and no distress  Imaging Bone Density: Spine T Score: -2.8, Hip T Score: -3.1   Done on 02/08/17 FRAX score:  Not calculated      Discussion:  Pt is balancing, right now, GI issues and sensitivities to supplements as well as medications for reflux, as well as hamstring injury and decrease in ability to do certain exercises with the need to be on calcium, Vit D and increase weight bearing exercises.  Feeling very defeated, at the moment.                                Assessment:   Osteoporosis   Plan:   1.  Reviewed medications options.  Bisphosphonates po and Recast are not options due to reflux and side effects from Reclast in the past. Evista, Prolia, Forteo and calcitonin reviewed including risks, benefits, side effects, administration.  She wants ot do some additional research.  2.  Will obtain TSH, Vit D, CMP, Magnesium, Phosphorus, and PTH today.  3.)  Will plan to repeat BMD no matter what she decides about medication options.  4.)  Consider yoga as well as other weight bearing exercises.  30 min/3 times weekly recommended  ~30 minutes spent with patient >50% of time was in face to face discussion of above.

## 2017-02-26 LAB — VITAMIN D 25 HYDROXY (VIT D DEFICIENCY, FRACTURES): VIT D 25 HYDROXY: 30 ng/mL (ref 30–100)

## 2017-02-26 LAB — PTH, INTACT AND CALCIUM
Calcium: 9.6 mg/dL (ref 8.6–10.4)
PTH: 30 pg/mL (ref 14–64)

## 2017-02-26 LAB — MAGNESIUM: Magnesium: 2.1 mg/dL (ref 1.5–2.5)

## 2017-02-27 ENCOUNTER — Telehealth: Payer: Self-pay | Admitting: Obstetrics & Gynecology

## 2017-02-27 NOTE — Telephone Encounter (Signed)
Patient called and said, "Dr. Sabra Heck prescribed me some levothyroxine recently and last night night I broke out in hives. My arm puffed up and my torso. I must be allergic to this too." Patient reported she has not had any trouble breathing or had to take any benadryl medication for this.  Last seen: 02/25/17

## 2017-02-27 NOTE — Telephone Encounter (Signed)
Spoke with patient, advised as seen below per Dr. Silva. Patient verbalizes understanding and is agreeable.   Routing to provider for final review. Patient is agreeable to disposition. Will close encounter.  

## 2017-02-27 NOTE — Telephone Encounter (Signed)
I do recommend a "sick visit" with her PCP or see urgent care for the sinusitis.   Cc- Dr. Sabra Heck.

## 2017-02-27 NOTE — Telephone Encounter (Signed)
Spoke with patient. Patient states she did not sleep well on Sunday night, thought possibly something was wrong with her tooth or possibly a sinus infection. Patient states she went to endodontist on Monday, x-ray negative for tooth, was advised looks like sinus infection. Patient states she was in to see Dr. Sabra Heck on Monday for bone health, discussed this possible sinus infection at that OV, was prescribed levofloxacin. Patient states d/t to allergies and being on clindamycin in past wanted to try something different to prevent c-diff. Patient states she took first dose Tuesday morning and within 3 hours hives were on left arm and trunk with itching. Patient states no wheezing or SHOB. Patient states she never felt like she was to the point of needing benadryl. Patient states redness still present, but better, no raised areas. Patient states sinus is no better, ears still stopped up and tooth hurts, can Dr. Sabra Heck prescribe alternative? Advised patient to stop medication. Recommended f/u with pcp for evaluation of "sinus infection". Advised patient Dr. Sabra Heck is out of the office today, will review with covering provider and return call with any additional recommendations. Patient states she sees Dr. Brigitte Pulse, may take a couple of days before can be seen, guess I can go to Urgent Care if needed. Patient verbalizes understanding.  Dr. Quincy Simmonds -please review, any additional recommendations? Allergies updated to reflect levofloxacin allergy.  Cc: Dr. Sabra Heck

## 2017-02-27 NOTE — Telephone Encounter (Signed)
To Dr. Sabra Heck

## 2017-04-16 ENCOUNTER — Other Ambulatory Visit: Payer: Self-pay | Admitting: Obstetrics & Gynecology

## 2017-04-16 NOTE — Telephone Encounter (Signed)
Medication refill request: Zolpidem Last AEX:  09/18/16 SM Next AEX: 01/10/18 SM Last MMG (if hormonal medication request): 02/08/17 [id number], Density D, [employer]is Refill authorized: 09/18/16 #30 1R. Please advise. Thank you.

## 2017-04-19 NOTE — Telephone Encounter (Signed)
Prescription faxed to [facility] on file.

## 2017-05-30 ENCOUNTER — Ambulatory Visit (INDEPENDENT_AMBULATORY_CARE_PROVIDER_SITE_OTHER): Payer: 59 | Admitting: Sports Medicine

## 2017-05-30 VITALS — BP 116/70

## 2017-05-30 DIAGNOSIS — S76311A Strain of muscle, fascia and tendon of the posterior muscle group at thigh level, right thigh, initial encounter: Secondary | ICD-10-CM

## 2017-05-30 DIAGNOSIS — S76301D Unspecified injury of muscle, fascia and tendon of the posterior muscle group at thigh level, right thigh, subsequent encounter: Secondary | ICD-10-CM

## 2017-05-30 NOTE — Progress Notes (Signed)
Right Hamstring pain  In sept 2016 patient had HS pain Found to have fascial herniation between layers of 2 HS muscles She did HEP Improved strength Gradually pain resolved and did pretty well  In 04/2016 had episode of both back pain and some piriformis strain - again on RT LE  This summer was having more HS tightness Did not feel that HS was strong and not able to exercise as much  Had taken Levoquin for cystitis  After riding 4 hours in car felt that she had swelling in back of thigh Tender but not severe pain Bulge persists and comes for evaluation  ROS No LBP now No sciatica  PE Pleasant F in NAD BP 116/70   LMP 11/19/1989   RT posterior thigh shows large bulge along medial aspect of HS mm Strength is good and does not generate pain Normal hip ROM Normal flexion of knee No bruising Only very mild TTP  Ultrasound of Hamstring Right  Semimembranosus is absent at ischial tuberosity Retracted Muscle belly seems reattached about 7 cms distal to IT Distal attachment shows some hypoechoic change Mid portion of muscle mass of semimembranosus shows hypoechoic change and disarray of fibers  Impression ; Tear of semimembranosus with retraction that is chronic  Ultrasound and interpretation by Wolfgang Phoenix. Oneida Alar, MD

## 2017-05-30 NOTE — Assessment & Plan Note (Signed)
We will refer her to PT for good exercise program  Tear is chronic but likely started after Levoquin  Use compression sleeve Build up strength and walking steadily  Prognosis is good with conservative care

## 2017-06-03 ENCOUNTER — Ambulatory Visit: Payer: 59 | Attending: Sports Medicine | Admitting: Physical Therapy

## 2017-06-03 ENCOUNTER — Encounter: Payer: Self-pay | Admitting: Physical Therapy

## 2017-06-03 DIAGNOSIS — M6281 Muscle weakness (generalized): Secondary | ICD-10-CM | POA: Insufficient documentation

## 2017-06-03 DIAGNOSIS — R252 Cramp and spasm: Secondary | ICD-10-CM | POA: Insufficient documentation

## 2017-06-03 NOTE — Patient Instructions (Signed)

## 2017-06-03 NOTE — Therapy (Signed)
[facility] [address] [place], [place], [address] Phone: [phone]   Fax:  [phone]  Physical Therapy Evaluation  Patient Details  Name: Kristin Andersen MRN: [id number] Date of Birth: 27-Jul-1956 Referring Provider: Dr. Hector Shade  Encounter Date: 06/03/2017      PT End of Session - 06/03/17 1713    Visit Number 1   Date for PT Re-Evaluation 07/29/17   Authorization - Visit Number 1   Authorization - Number of Visits 21   PT Start Time 4401   PT Stop Time 1630   PT Time Calculation (min) 60 min   Activity Tolerance Patient tolerated treatment well   Behavior During Therapy [facility] for tasks assessed/performed      Past Medical History:  Diagnosis Date  . Hypothyroidism   . IC (interstitial cystitis)   . Myofascial pain dysfunction syndrome    Right side  . Osteoporosis   . Premature menopause   . Stomach ulcer   . Thyroid disease     Past Surgical History:  Procedure Laterality Date  . CERVICAL CONE BIOPSY    . CERVIX LESION DESTRUCTION    . CESAREAN SECTION    . CHOLECYSTECTOMY    . DILATION AND CURETTAGE OF UTERUS    . ROOT CANAL  11/13   3 on 2 teeth  . ROOT CANAL  04/16/14 and 04/27/14   x2  . TONSILLECTOMY      There were no vitals filed for this visit.       Subjective Assessment - 06/03/17 1538    Subjective Patient reports in the past month her hamstring has detached from the bone.  Patient has the sleeve to wear on her right upper thigh.  Has pain after wearing the sleeve for 1 hour   Limitations Sitting   How long can you sit comfortably? sit to drive a car, sit 1 hour,    Patient Stated Goals understand how to exercise correctly, reduce pain and increase strength, make lump smaller   Currently in Pain? Yes   Pain Score 7    Pain Location Leg   Pain Orientation Right;Upper;Posterior   Pain Descriptors / Indicators Aching   Pain Type Chronic pain   Pain Onset More than a  month ago   Pain Frequency Intermittent   Aggravating Factors  sitting, driving, stairs, power walk   Pain Relieving Factors sleeping   Multiple Pain Sites No            [facility] PT Assessment - 06/03/17 0001      Assessment   Medical Diagnosis S76.301D Right hamstring injury, sebsequent encounter   Referring Provider Dr. Hector Shade   Onset Date/Surgical Date 05/04/17   Prior Therapy None     Precautions   Precautions Other (comment)   Precaution Comments osteoporosis     Restrictions   Weight Bearing Restrictions No     Balance Screen   Has the patient fallen in the past 6 months No   Has the patient had a decrease in activity level because of a fear of falling?  No   Is the patient reluctant to leave their home because of a fear of falling?  No     Home Ecologist residence     Prior Function   Level of Independence Independent   Leisure not exercise due to right leg pain     Cognition   Overall Cognitive Status Within Functional Limits  for tasks assessed     Observation/Other Assessments   Focus on Therapeutic Outcomes (FOTO)  50% limitation  goal is 37% limitation     Posture/Postural Control   Posture/Postural Control No significant limitations     ROM / Strength   AROM / PROM / Strength AROM;PROM;Strength     AROM   Overall AROM Comments lumbar ROM is full     Strength   Right Hip Extension 3/5   Right Hip ABduction 4/5   Right Knee Flexion 4/5  knee flexed with foot everted     Palpation   SI assessment  pelvis in correct alignment   Palpation comment tenderness located in medial hamstring, left hip adductor,      Transfers   Transfers Not assessed            Objective measurements completed on examination: See above findings.          [facility] Adult PT Treatment/Exercise - 06/03/17 0001      Modalities   Modalities Electrical Stimulation;Moist Heat     Moist Heat Therapy   Number Minutes Moist Heat  15 Minutes   Moist Heat Location Other (comment)  right hamstring in prone     Electrical Stimulation   Electrical Stimulation Location right hamstring   Electrical Stimulation Action IFC   Electrical Stimulation Parameters 15 min, to patient tolerance   Electrical Stimulation Goals Pain     Manual Therapy   Manual Therapy Soft tissue mobilization   Soft tissue mobilization soft tissue work  to right hamstring          Trigger Point Dry Needling - 06/03/17 1628    Consent Given? Yes   Education Handout Provided Yes   Muscles Treated Upper Body Hamstring   Muscles Treated Lower Body Hamstring   Hamstring Response Twitch response elicited;Palpable increased muscle length              PT Education - 06/03/17 1713    Education provided Yes   Education Details information on dry needling   Person(s) Educated Patient   Methods Explanation;Demonstration;Verbal cues;Handout   Comprehension Returned demonstration;Verbalized understanding          PT Short Term Goals - 06/03/17 1722      PT SHORT TERM GOAL #1   Title Independent with flexibility exercises   Time 4   Period Weeks   Status New     PT SHORT TERM GOAL #2   Title pain with sitting and driving decreased >/= 25% due to decreased trigger points in right hamstring   Time 4   Period Weeks   Status New     PT SHORT TERM GOAL #3   Title pain with stairs decreased >/= 25% due to right hip extension strength has increased to 3+/5   Time 4   Period Weeks   Status New           PT Long Term Goals - 06/03/17 1723      PT LONG TERM GOAL #1   Title independent with HEP and how to progress herself   Time 8   Period Weeks   Status New     PT LONG TERM GOAL #2   Title pain with driving and sitting decreased >/= 75% due to improve right hamstring mobility and strength   Time 8   Period Weeks   Status New     PT LONG TERM GOAL #3   Title pain with power walking decreased >/=  75% after 30 minutes due  to increased endurance of right hamstring   Period Weeks   Status New     PT LONG TERM GOAL #4   Title FOTO score is </= 37% limitation   Time 8   Period Weeks   Status New                Plan - 06/03/17 1714    Clinical Impression Statement Patient is a 61 year old female with right hamstring injury that has become progressively worse in the past month.  Patient has torne the Semimembranosis and is reattached to 7 CM distal to the IT.  Patient reports her pain is 7/10 when she is using her hamstring muscle.  Patient has pain with sitting, driving, walking up hill, power walk.  Patient has not been able to exercise due to the pain.  Patient right hamstring strength is 4/5 and right hip extension strength is 3/5.  Patient has a muscle buldge of the medial right hamstring.  Patient has tenderness located in right hamstring and hip adductors.  Patient will benefit from skilled therapy to reduce pain and increase strength of right leg so she is able to resume exerise and prior function.    History and Personal Factors relevant to plan of care: myofascial pain syndrome; hypothyroidism; history of taking Levoquin   Clinical Presentation Evolving   Clinical Presentation due to: pain is progressively worse and she is unable to exercise   Clinical Decision Making Moderate   Rehab Potential Excellent   PT Frequency 2x / week   PT Duration 8 weeks   PT Treatment/Interventions Cryotherapy;Electrical Stimulation;Iontophoresis 4mg /ml Dexamethasone;Moist Heat;Ultrasound;Patient/family education;Neuromuscular re-education;Therapeutic exercise;Therapeutic activities;Manual techniques;Dry needling;Taping;Energy conservation   PT Next Visit Plan see how dry needling has helped; soft tissue work, hamstring isometrics; stretching; ultrasound; possible taping   PT Home Exercise Plan progress as needed   Consulted and Agree with Plan of Care Patient      Patient will benefit from skilled therapeutic  intervention in order to improve the following deficits and impairments:  Difficulty walking, Increased fascial restricitons, Decreased endurance, Decreased activity tolerance, Decreased strength, Impaired flexibility, Pain, Increased muscle spasms  Visit Diagnosis: Cramp and spasm - Plan: PT plan of care cert/re-cert  Muscle weakness (generalized) - Plan: PT plan of care cert/re-cert     Problem List Patient Active Problem List   Diagnosis Date Noted  . Tear of right hamstring 05/30/2017  . Abnormal leg finding 07/07/2015  . Myofascial pain dysfunction syndrome 11/21/2012  . Piriformis syndrome of right side 06/20/2012  . Nonallopathic lesion of lumbosacral region 06/20/2012  . Plantar fasciitis 04/17/2011  . Acquired hallux rigidus 04/17/2011    Earlie Counts, PT 06/03/17 5:27 PM   [facility] [address] [place], [place], [address] Phone: [phone]   Fax:  [phone]  Name: Kristin Andersen MRN: [id number] Date of Birth: October 26, 1956

## 2017-06-04 ENCOUNTER — Ambulatory Visit: Payer: 59 | Admitting: Physical Therapy

## 2017-06-04 DIAGNOSIS — M6281 Muscle weakness (generalized): Secondary | ICD-10-CM

## 2017-06-04 DIAGNOSIS — R252 Cramp and spasm: Secondary | ICD-10-CM

## 2017-06-04 NOTE — Patient Instructions (Signed)
      Abduction: Clam (Eccentric) - Side-Lying   Lie on side with knees bent. Lift top knee, keeping feet together. Keep trunk steady. Slowly lower for 3-5 seconds. _10__ reps per set, _1__ sets per day, _7__ days per week. Add __band when you achieve __25_ repetitions.  Copyright  VHI. All rights reserved.        Ruben Im PT [facility] [address] [place], [place] [address] Phone # [phone] Fax [phone]

## 2017-06-04 NOTE — Therapy (Signed)
[facility] [address], [place], [place], [address] Phone: [phone]   Fax:  [phone]  Physical Therapy Treatment  Patient Details  Name: Kristin Andersen MRN: [id number] Date of Birth: [birth year] Referring Provider: Dr. Hector Shade  Encounter Date: 06/04/2017      PT End of Session - 06/04/17 1047    Visit Number 2   Number of Visits 21   Date for PT Re-Evaluation 07/29/17   Authorization - Visit Number 2   Authorization - Number of Visits 21   PT Start Time 0930   PT Stop Time 1030   PT Time Calculation (min) 60 min   Activity Tolerance Patient tolerated treatment well      Past Medical History:  Diagnosis Date  . Hypothyroidism   . IC (interstitial cystitis)   . Myofascial pain dysfunction syndrome    Right side  . Osteoporosis   . Premature menopause   . Stomach ulcer   . Thyroid disease     Past Surgical History:  Procedure Laterality Date  . CERVICAL CONE BIOPSY    . CERVIX LESION DESTRUCTION    . CESAREAN SECTION    . CHOLECYSTECTOMY    . DILATION AND CURETTAGE OF UTERUS    . ROOT CANAL  11/13   3 on 2 teeth  . ROOT CANAL  04/16/14 and 04/27/14   x2  . TONSILLECTOMY      There were no vitals filed for this visit.      Subjective Assessment - 06/04/17 0933    Subjective Achey with sitting and stairs.  My goal is to get back to where I was.  I wear the sleeve but it starts to hurt after a while.     Currently in Pain? Yes   Pain Score 7    Pain Location Leg   Pain Orientation Right;Upper;Posterior   Pain Type Chronic pain                         [facility] Adult PT Treatment/Exercise - 06/04/17 0001      Lumbar Exercises: Supine   Ab Set 5 reps   Bent Knee Raise 10 reps   Bridge 10 reps   Bridge Limitations with ball squeeze and green band    Isometric Hip Flexion 10 reps   Other Supine Lumbar Exercises alternating ball squeeze and hip abduction isometric  against green band     Knee/Hip Exercises: Supine   Other Supine Knee/Hip Exercises HS flossing 10x right per HEP     Moist Heat Therapy   Number Minutes Moist Heat 15 Minutes   Moist Heat Location Other (comment)  right hamstring in prone     Electrical Stimulation   Electrical Stimulation Location right hamstring   Electrical Stimulation Action IFC   Electrical Stimulation Parameters 15 min 8 ma   Electrical Stimulation Goals Pain     Manual Therapy   Manual Therapy Taping   Kinesiotex Inhibit Muscle     Kinesiotix   Inhibit Muscle  Y formation proximal to distal          Trigger Point Dry Needling - 06/03/17 1628    Consent Given? Yes   Education Handout Provided Yes   Muscles Treated Upper Body Hamstring   Muscles Treated Lower Body Hamstring   Hamstring Response Twitch response elicited;Palpable increased muscle length              PT Education -  06/04/17 1012    Education provided Yes   Education Details supine HS flossing, abdominal brace; clams   Person(s) Educated Patient   Methods Explanation;Demonstration;Handout   Comprehension Verbalized understanding;Returned demonstration          PT Short Term Goals - 06/04/17 1734      PT SHORT TERM GOAL #1   Title Independent with flexibility exercises   Time 4   Period Weeks   Status On-going     PT SHORT TERM GOAL #2   Title pain with sitting and driving decreased >/= 25% due to decreased trigger points in right hamstring   Time 4   Period Weeks   Status On-going     PT SHORT TERM GOAL #3   Title pain with stairs decreased >/= 25% due to right hip extension strength has increased to 3+/5   Time 4   Period Weeks   Status On-going           PT Long Term Goals - 06/04/17 1734      PT LONG TERM GOAL #1   Title independent with HEP and how to progress herself   Time 8   Period Weeks   Status On-going     PT LONG TERM GOAL #2   Title pain with driving and sitting decreased >/= 75%  due to improve right hamstring mobility and strength   Time 8   Period Weeks   Status On-going     PT LONG TERM GOAL #3   Title pain with power walking decreased >/= 75% after 30 minutes due to increased endurance of right hamstring   Time 8   Period Weeks   Status On-going     PT LONG TERM GOAL #4   Title FOTO score is </= 37% limitation   Time 8   Period Weeks   Status On-going               Plan - 06/04/17 1730    Clinical Impression Statement Patient is fearful of exacerbating her injury and pain level and reports she is depressed about her lack of ability to exercise.  Treatment focus on lumbo/pelvic/hip strengthening without pain increase as well as gentle hamstring mobility (supine flossing).   Good pain relief with electrical stimulation and heat.     PT Next Visit Plan assess response to kinesiotape;  gentle ROM; core strengthening;  ultrasound,  electrical stimulation;  dry needling if first time helpful;  Slowly progress to standing ex's      Patient will benefit from skilled therapeutic intervention in order to improve the following deficits and impairments:     Visit Diagnosis: Cramp and spasm  Muscle weakness (generalized)     Problem List Patient Active Problem List   Diagnosis Date Noted  . Tear of right hamstring 05/30/2017  . Abnormal leg finding 07/07/2015  . Myofascial pain dysfunction syndrome 11/21/2012  . Piriformis syndrome of right side 06/20/2012  . Nonallopathic lesion of lumbosacral region 06/20/2012  . Plantar fasciitis 04/17/2011  . Acquired hallux rigidus 04/17/2011   Ruben Im, PT 06/04/17 5:36 PM Phone: [phone] Fax: [phone]  [name] 06/04/2017, 5:35 PM  [facility] [address] [place], [place], [address] Phone: [phone]   Fax:  [phone]  Name: Kristin Andersen MRN: [id number] Date of Birth: 24-Oct-1956

## 2017-06-10 ENCOUNTER — Ambulatory Visit: Payer: 59 | Admitting: Rehabilitation

## 2017-06-10 ENCOUNTER — Encounter: Payer: Self-pay | Admitting: Rehabilitation

## 2017-06-10 DIAGNOSIS — R252 Cramp and spasm: Secondary | ICD-10-CM

## 2017-06-10 DIAGNOSIS — M6281 Muscle weakness (generalized): Secondary | ICD-10-CM

## 2017-06-10 NOTE — Therapy (Signed)
[facility] [address], [place], [place], [address] Phone: [phone]   Fax:  [phone]  Physical Therapy Treatment  Patient Details  Name: Kristin Andersen MRN: [id number] Date of Birth: 08-Mar-1956 Referring Provider: Dr. Hector Shade  Encounter Date: 06/10/2017      PT End of Session - 06/10/17 1015    Visit Number 3   Number of Visits 21   Date for PT Re-Evaluation 07/29/17   PT Start Time 0930   PT Stop Time 1027   PT Time Calculation (min) 57 min   Activity Tolerance Patient tolerated treatment well      Past Medical History:  Diagnosis Date  . Hypothyroidism   . IC (interstitial cystitis)   . Myofascial pain dysfunction syndrome    Right side  . Osteoporosis   . Premature menopause   . Stomach ulcer   . Thyroid disease     Past Surgical History:  Procedure Laterality Date  . CERVICAL CONE BIOPSY    . CERVIX LESION DESTRUCTION    . CESAREAN SECTION    . CHOLECYSTECTOMY    . DILATION AND CURETTAGE OF UTERUS    . ROOT CANAL  11/13   3 on 2 teeth  . ROOT CANAL  04/16/14 and 04/27/14   x2  . TONSILLECTOMY      There were no vitals filed for this visit.      Subjective Assessment - 06/10/17 0933    Subjective The bad leg is feeling better and the good leg is maybe feeling worse.  Im just afraid it is happening to the other side.  My R leg was really hurting thursday so i went to get accupuncture.  It has been feeling better since Saturday.  I think I will be talking to a surgeon possibly on wednesday    Currently in Pain? Yes   Pain Score 1    Pain Location Leg                         [facility] Adult PT Treatment/Exercise - 06/10/17 0001      Lumbar Exercises: Supine   Ab Set 5 reps   Bent Knee Raise 10 reps   Bridge 10 reps   Bridge Limitations with ball squeeze   Other Supine Lumbar Exercises isometric hip extension into bed roll     Moist Heat Therapy   Number  Minutes Moist Heat 15 Minutes   Moist Heat Location Other (comment)     Electrical Stimulation   Electrical Stimulation Location right hamstring   Electrical Stimulation Action IFC   Electrical Stimulation Parameters to tolerance   Electrical Stimulation Goals Pain     Manual Therapy   Soft tissue mobilization soft tissue work  to right hamstring          Trigger Point Dry Needling - 06/10/17 1003    Consent Given? Yes   Muscles Treated Lower Body Hamstring  performed by Sigurd Sos, PT   Hamstring Response Twitch response elicited;Palpable increased muscle length                PT Short Term Goals - 06/04/17 1734      PT SHORT TERM GOAL #1   Title Independent with flexibility exercises   Time 4   Period Weeks   Status On-going     PT SHORT TERM GOAL #2   Title pain with sitting and driving decreased >/= 25% due  to decreased trigger points in right hamstring   Time 4   Period Weeks   Status On-going     PT SHORT TERM GOAL #3   Title pain with stairs decreased >/= 25% due to right hip extension strength has increased to 3+/5   Time 4   Period Weeks   Status On-going           PT Long Term Goals - 06/04/17 1734      PT LONG TERM GOAL #1   Title independent with HEP and how to progress herself   Time 8   Period Weeks   Status On-going     PT LONG TERM GOAL #2   Title pain with driving and sitting decreased >/= 75% due to improve right hamstring mobility and strength   Time 8   Period Weeks   Status On-going     PT LONG TERM GOAL #3   Title pain with power walking decreased >/= 75% after 30 minutes due to increased endurance of right hamstring   Time 8   Period Weeks   Status On-going     PT LONG TERM GOAL #4   Title FOTO score is </= 37% limitation   Time 8   Period Weeks   Status On-going               Plan - 06/10/17 1155    Clinical Impression Statement Pt feeling better overall today.  Contributes this mainly to needling  and accupuncture.  Pt with fear about the same thing happening to the L LE due to pain in the hip/thigh lately but upon palpation it seems to be located to the piriformis and not the hamstrings.  PT certified in needling able to come in to perform during session.  No pain reported during TE   PT Treatment/Interventions Cryotherapy;Electrical Stimulation;Iontophoresis 4mg /ml Dexamethasone;Moist Heat;Ultrasound;Patient/family education;Neuromuscular re-education;Therapeutic exercise;Therapeutic activities;Manual techniques;Dry needling;Taping;Energy conservation   PT Next Visit Plan needling, tape?, continue core and gentle LE strength, modalities      Patient will benefit from skilled therapeutic intervention in order to improve the following deficits and impairments:  Difficulty walking, Increased fascial restricitons, Decreased endurance, Decreased activity tolerance, Decreased strength, Impaired flexibility, Pain, Increased muscle spasms  Visit Diagnosis: Cramp and spasm  Muscle weakness (generalized)     Problem List Patient Active Problem List   Diagnosis Date Noted  . Tear of right hamstring 05/30/2017  . Abnormal leg finding 07/07/2015  . Myofascial pain dysfunction syndrome 11/21/2012  . Piriformis syndrome of right side 06/20/2012  . Nonallopathic lesion of lumbosacral region 06/20/2012  . Plantar fasciitis 04/17/2011  . Acquired hallux rigidus 04/17/2011    Stark Bray, DPT, CMP 06/10/2017, 12:01 PM  [facility] [address] [place], [place], [address] Phone: [phone]   Fax:  [phone]  Name: Kristin Andersen MRN: [id number] Date of Birth: 11-Nov-1956

## 2017-06-13 ENCOUNTER — Ambulatory Visit: Payer: 59 | Admitting: Physical Therapy

## 2017-06-13 ENCOUNTER — Encounter: Payer: Self-pay | Admitting: Physical Therapy

## 2017-06-13 DIAGNOSIS — M6281 Muscle weakness (generalized): Secondary | ICD-10-CM

## 2017-06-13 DIAGNOSIS — R252 Cramp and spasm: Secondary | ICD-10-CM | POA: Diagnosis not present

## 2017-06-13 NOTE — Therapy (Signed)
[facility] [address] [place], [place], [address] Phone: [phone]   Fax:  [phone]  Physical Therapy Treatment  Patient Details  Name: Kristin Andersen MRN: [id number] Date of Birth: 08-Aug-1956 Referring Provider: Dr. Hector Shade  Encounter Date: 06/13/2017      PT End of Session - 06/13/17 1101    Visit Number 4   Number of Visits 21   Date for PT Re-Evaluation 07/29/17   Authorization - Visit Number 3   Authorization - Number of Visits 21   PT Start Time 1102   PT Stop Time 1150   PT Time Calculation (min) 48 min   Activity Tolerance Patient tolerated treatment well   Behavior During Therapy [facility] for tasks assessed/performed      Past Medical History:  Diagnosis Date  . Hypothyroidism   . IC (interstitial cystitis)   . Myofascial pain dysfunction syndrome    Right side  . Osteoporosis   . Premature menopause   . Stomach ulcer   . Thyroid disease     Past Surgical History:  Procedure Laterality Date  . CERVICAL CONE BIOPSY    . CERVIX LESION DESTRUCTION    . CESAREAN SECTION    . CHOLECYSTECTOMY    . DILATION AND CURETTAGE OF UTERUS    . ROOT CANAL  11/13   3 on 2 teeth  . ROOT CANAL  04/16/14 and 04/27/14   x2  . TONSILLECTOMY      There were no vitals filed for this visit.      Subjective Assessment - 06/13/17 1130    Subjective The right leg is still feeling better.  She states the orthopedist said she could do an MRI, but she is not sure if she is going to get it since she is feeling better.     Limitations Sitting   How long can you sit comfortably? sit to drive a car, sit 1 hour,    Patient Stated Goals understand how to exercise correctly, reduce pain and increase strength, make lump smaller   Currently in Pain? Yes   Pain Score 1    Pain Location Leg   Pain Orientation Right;Upper;Posterior   Pain Descriptors / Indicators Aching   Pain Type Chronic pain   Pain Onset More  than a month ago   Pain Frequency Intermittent   Aggravating Factors  sitting   Pain Relieving Factors sleeping   Multiple Pain Sites No                         [facility] Adult PT Treatment/Exercise - 06/13/17 0001      Moist Heat Therapy   Number Minutes Moist Heat 15 Minutes   Moist Heat Location --  piriformis left side     Electrical Stimulation   Electrical Stimulation Location left piriformis   Electrical Stimulation Action IFC   Electrical Stimulation Parameters to tolerance   Electrical Stimulation Goals Pain     Manual Therapy   Soft tissue mobilization lumbar paraspinals, QL, piriformis and glutes Lt side only                PT Education - 06/13/17 1142    Education provided Yes   Education Details h/s stretch, single knee to chest   Person(s) Educated Patient   Methods Explanation;Verbal cues;Handout   Comprehension Verbalized understanding          PT Short Term Goals - 06/04/17 1734  PT SHORT TERM GOAL #1   Title Independent with flexibility exercises   Time 4   Period Weeks   Status On-going     PT SHORT TERM GOAL #2   Title pain with sitting and driving decreased >/= 25% due to decreased trigger points in right hamstring   Time 4   Period Weeks   Status On-going     PT SHORT TERM GOAL #3   Title pain with stairs decreased >/= 25% due to right hip extension strength has increased to 3+/5   Time 4   Period Weeks   Status On-going           PT Long Term Goals - 06/13/17 1130      PT LONG TERM GOAL #1   Title independent with HEP and how to progress herself   Time 8   Period Weeks   Status On-going     PT LONG TERM GOAL #2   Title pain with driving and sitting decreased >/= 75% due to improve right hamstring mobility and strength   Time 8   Period Weeks   Status On-going     PT LONG TERM GOAL #3   Title pain with power walking decreased >/= 75% after 30 minutes due to increased endurance of right hamstring    Time 8   Period Weeks   Status On-going     PT LONG TERM GOAL #4   Title FOTO score is </= 37% limitation   Time 8   Period Weeks   Status On-going               Plan - 06/13/17 1144    Clinical Impression Statement Pt still feeling better overall. Did not do dry needling since just had it Monday and states she just began to feel better.  She had improved muscle feel with less spasms after STM.  Pt abe to do gentle stretches but has increased pain from 1 to 3/10.  Pt needs skilled PT for pain management and soft tissue length and strength.   PT Treatment/Interventions Cryotherapy;Electrical Stimulation;Iontophoresis 4mg /ml Dexamethasone;Moist Heat;Ultrasound;Patient/family education;Neuromuscular re-education;Therapeutic exercise;Therapeutic activities;Manual techniques;Dry needling;Taping;Energy conservation   PT Next Visit Plan needling and STM as needed, core and gentle LE strength, modalities   PT Home Exercise Plan progress as needed   Consulted and Agree with Plan of Care Patient      Patient will benefit from skilled therapeutic intervention in order to improve the following deficits and impairments:  Difficulty walking, Increased fascial restricitons, Decreased endurance, Decreased activity tolerance, Decreased strength, Impaired flexibility, Pain, Increased muscle spasms  Visit Diagnosis: Cramp and spasm  Muscle weakness (generalized)     Problem List Patient Active Problem List   Diagnosis Date Noted  . Tear of right hamstring 05/30/2017  . Abnormal leg finding 07/07/2015  . Myofascial pain dysfunction syndrome 11/21/2012  . Piriformis syndrome of right side 06/20/2012  . Nonallopathic lesion of lumbosacral region 06/20/2012  . Plantar fasciitis 04/17/2011  . Acquired hallux rigidus 04/17/2011    Wayne Both 06/13/2017, 11:48 AM  [facility] [address] [place], [place],  [address] Phone: [phone]   Fax:  [phone]  Name: TAMIA DIAL MRN: [id number] Date of Birth: [birth year]

## 2017-06-13 NOTE — Patient Instructions (Addendum)
Knee to Chest    Lying supine, bend involved knee to chest __5_ times hold 10 sec. Repeat with other leg. Do __1_ times per day.  Copyright  VHI. All rights reserved.     HAMSTRING STRETCH WITH TOWEL  While lying down on your back, hook a towel or strap under  your foot and draw up your leg until a stretch is felt under your leg hamstring and calf area.  Keep your knee in a straightened position during the stretch. Hold 30 sec repeat 3 x

## 2017-06-18 ENCOUNTER — Ambulatory Visit: Payer: 59 | Admitting: Physical Therapy

## 2017-06-18 ENCOUNTER — Encounter: Payer: Self-pay | Admitting: Physical Therapy

## 2017-06-18 DIAGNOSIS — R252 Cramp and spasm: Secondary | ICD-10-CM

## 2017-06-18 DIAGNOSIS — M6281 Muscle weakness (generalized): Secondary | ICD-10-CM

## 2017-06-18 NOTE — Therapy (Signed)
[facility] [address], [place], [place], [address] Phone: [phone]   Fax:  [phone]  Physical Therapy Treatment  Patient Details  Name: Kristin Andersen MRN: [id number] Date of Birth: 12-20-1955 Referring Provider: Dr. Hector Shade  Encounter Date: 06/18/2017      PT End of Session - 06/18/17 0809    Visit Number 5   Number of Visits 21   Date for PT Re-Evaluation 07/29/17   Authorization - Visit Number 4   Authorization - Number of Visits 21   PT Start Time 0804   PT Stop Time 0900   PT Time Calculation (min) 56 min   Activity Tolerance Patient tolerated treatment well   Behavior During Therapy [facility] for tasks assessed/performed      Past Medical History:  Diagnosis Date  . Hypothyroidism   . IC (interstitial cystitis)   . Myofascial pain dysfunction syndrome    Right side  . Osteoporosis   . Premature menopause   . Stomach ulcer   . Thyroid disease     Past Surgical History:  Procedure Laterality Date  . CERVICAL CONE BIOPSY    . CERVIX LESION DESTRUCTION    . CESAREAN SECTION    . CHOLECYSTECTOMY    . DILATION AND CURETTAGE OF UTERUS    . ROOT CANAL  11/13   3 on 2 teeth  . ROOT CANAL  04/16/14 and 04/27/14   x2  . TONSILLECTOMY      There were no vitals filed for this visit.      Subjective Assessment - 06/18/17 0807    Subjective I have had increased pain in left leg so I had accupuncture.  My right leg is 30% better.  Bad pain is pretty much gone.    Limitations Sitting   How long can you sit comfortably? sit to drive a car, sit 1 hour,    Patient Stated Goals understand how to exercise correctly, reduce pain and increase strength, make lump smaller   Currently in Pain? Yes   Pain Score 1    Pain Location Leg   Pain Orientation Right;Upper;Posterior   Pain Descriptors / Indicators Aching   Pain Type Chronic pain   Pain Onset More than a month ago   Pain Frequency Intermittent    Aggravating Factors  sitting on a hard chair   Pain Relieving Factors sleeping   Multiple Pain Sites No            [facility] PT Assessment - 06/18/17 0001      Strength   Right Hip Extension 3+/5   Right Knee Flexion 4/5                     [facility] Adult PT Treatment/Exercise - 06/18/17 0001      Self-Care   Self-Care Other Self-Care Comments   Other Self-Care Comments  roll hamstring on ball in long sitting; place a rolled towel under right knee while driving to release tension on the right hamstring     Knee/Hip Exercises: Stretches   Active Hamstring Stretch Left;Right;2 reps;30 seconds   Active Hamstring Stretch Limitations use a strap just to a gentle stretch     Knee/Hip Exercises: Prone   Hamstring Curl 1 set;5 seconds  isometrics at different angles   Hamstring Curl Limitations monitor for pain   Hip Extension Right;2 sets;10 reps  contract gluteal first   Hip Extension Limitations monitor with pain  Modalities   Modalities Electrical Stimulation;Moist Heat     Moist Heat Therapy   Number Minutes Moist Heat 15 Minutes   Moist Heat Location Other (comment)     Electrical Stimulation   Electrical Stimulation Location bil. hamstring  bil. hamstring in prone   Electrical Stimulation Action premod   Electrical Stimulation Parameters to tolerance, 15 in     Manual Therapy   Manual Therapy Soft tissue mobilization;Myofascial release   Soft tissue mobilization right hamstring, iliotibial band , and around the greater trochanter   Myofascial Release tissue scrapping of right hamstring with assistive device                PT Education - 06/18/17 0852    Education provided Yes   Education Details ways to [facility] the hamstring and how to stretch correctly, sitting in car with rolled in towel, piriformis stretch   Person(s) Educated Patient   Methods Explanation;Demonstration;Verbal cues;Handout   Comprehension Returned  demonstration;Verbalized understanding          PT Short Term Goals - 06/18/17 1740      PT SHORT TERM GOAL #1   Title Independent with flexibility exercises   Time 4   Period Weeks   Status Achieved     PT SHORT TERM GOAL #2   Title pain with sitting and driving decreased >/= 25% due to decreased trigger points in right hamstring   Time 4   Period Weeks   Status Achieved     PT SHORT TERM GOAL #3   Title pain with stairs decreased >/= 25% due to right hip extension strength has increased to 3+/5   Time 4   Period Weeks   Status Achieved           PT Long Term Goals - 06/13/17 1130      PT LONG TERM GOAL #1   Title independent with HEP and how to progress herself   Time 8   Period Weeks   Status On-going     PT LONG TERM GOAL #2   Title pain with driving and sitting decreased >/= 75% due to improve right hamstring mobility and strength   Time 8   Period Weeks   Status On-going     PT LONG TERM GOAL #3   Title pain with power walking decreased >/= 75% after 30 minutes due to increased endurance of right hamstring   Time 8   Period Weeks   Status On-going     PT LONG TERM GOAL #4   Title FOTO score is </= 37% limitation   Time 8   Period Weeks   Status On-going               Plan - 06/18/17 8144    Clinical Impression Statement Patient has increased strength of right leg.  Patient needs verbal cues to contract the buttocks before right hip extension.  Patient has decreased right hamstring lump in the right hamstring.  Patient has 30% less pain.  Patient will benefit from skilled therapy to reduce pain and improve strength of right hamstring.    Rehab Potential Excellent   PT Frequency 2x / week   PT Duration 8 weeks   PT Treatment/Interventions Cryotherapy;Electrical Stimulation;Iontophoresis 4mg /ml Dexamethasone;Moist Heat;Ultrasound;Patient/family education;Neuromuscular re-education;Therapeutic exercise;Therapeutic activities;Manual  techniques;Dry needling;Taping;Energy conservation   PT Next Visit Plan dry needling if needed, massage the right hamstring, isometrics to right hamstring, dead lift in pain free range; modalities as needed   PT Home Exercise  Plan progress as needed   Consulted and Agree with Plan of Care Patient      Patient will benefit from skilled therapeutic intervention in order to improve the following deficits and impairments:  Difficulty walking, Increased fascial restricitons, Decreased endurance, Decreased activity tolerance, Decreased strength, Impaired flexibility, Pain, Increased muscle spasms  Visit Diagnosis: Cramp and spasm  Muscle weakness (generalized)     Problem List Patient Active Problem List   Diagnosis Date Noted  . Tear of right hamstring 05/30/2017  . Abnormal leg finding 07/07/2015  . Myofascial pain dysfunction syndrome 11/21/2012  . Piriformis syndrome of right side 06/20/2012  . Nonallopathic lesion of lumbosacral region 06/20/2012  . Plantar fasciitis 04/17/2011  . Acquired hallux rigidus 04/17/2011    Earlie Counts, PT 06/18/17 8:57 AM   [facility] [address] [place], [place], [address] Phone: [phone]   Fax:  [phone]  Name: FALINE LANGER MRN: [id number] Date of Birth: [birth year]

## 2017-06-18 NOTE — Patient Instructions (Addendum)
Use a tennis ball to roll over trigger points in hamstring and back to relieve muscle pain.   Use a rolled towel under the right thigh while driving.  Piriformis Stretch, Supine    Lie supine, legs bent, feet flat. Raise one bent leg and, grasping ankle with both hands, pull leg toward opposite shoulder. Hold _30__ seconds. Then pull to same shoulder for 30 seconds.  Repeat _2__ times per session. Do _1__ sessions per day. Perform with other leg straight.  Copyright  VHI. All rights reserved.      [place] [address], [place], [place] [address] Phone # [phone] Fax [phone]

## 2017-06-20 ENCOUNTER — Ambulatory Visit: Payer: 59 | Attending: Sports Medicine

## 2017-06-20 DIAGNOSIS — M25551 Pain in right hip: Secondary | ICD-10-CM | POA: Insufficient documentation

## 2017-06-20 DIAGNOSIS — R252 Cramp and spasm: Secondary | ICD-10-CM | POA: Insufficient documentation

## 2017-06-20 DIAGNOSIS — M25552 Pain in left hip: Secondary | ICD-10-CM | POA: Diagnosis present

## 2017-06-20 DIAGNOSIS — M6281 Muscle weakness (generalized): Secondary | ICD-10-CM | POA: Insufficient documentation

## 2017-06-20 NOTE — Therapy (Signed)
[facility] [address] [place], [place], [address] Phone: [phone]   Fax:  [phone]  Physical Therapy Treatment  Patient Details  Name: Kristin Andersen MRN: [id number] Date of Birth: Mar 03, 1956 Referring Provider: Dr. Hector Shade  Encounter Date: 06/20/2017      PT End of Session - 06/20/17 1141    Visit Number 6   Number of Visits 21   Date for PT Re-Evaluation 07/29/17   Authorization - Visit Number 6   Authorization - Number of Visits 21   PT Start Time 1100   PT Stop Time 1154   PT Time Calculation (min) 54 min   Activity Tolerance Patient tolerated treatment well   Behavior During Therapy [facility] for tasks assessed/performed      Past Medical History:  Diagnosis Date  . Hypothyroidism   . IC (interstitial cystitis)   . Myofascial pain dysfunction syndrome    Right side  . Osteoporosis   . Premature menopause   . Stomach ulcer   . Thyroid disease     Past Surgical History:  Procedure Laterality Date  . CERVICAL CONE BIOPSY    . CERVIX LESION DESTRUCTION    . CESAREAN SECTION    . CHOLECYSTECTOMY    . DILATION AND CURETTAGE OF UTERUS    . ROOT CANAL  11/13   3 on 2 teeth  . ROOT CANAL  04/16/14 and 04/27/14   x2  . TONSILLECTOMY      There were no vitals filed for this visit.      Subjective Assessment - 06/20/17 1103    Subjective I am doing OK.  My Rt leg is getting better.  I am having a lot of problems on the Lt.  Had MRI yesterday.   Patient Stated Goals understand how to exercise correctly, reduce pain and increase strength, make lump smaller   Currently in Pain? Yes   Pain Score 2    Pain Location Leg   Pain Orientation Right   Pain Type Chronic pain                         [facility] Adult PT Treatment/Exercise - 06/20/17 0001      Knee/Hip Exercises: Stretches   Active Hamstring Stretch Left;Right;30 seconds;3 reps   Active Hamstring Stretch Limitations use  a strap just to a gentle stretch     Knee/Hip Exercises: Prone   Hamstring Curl 1 set;5 seconds  isometrics at different angles   Hamstring Curl Limitations monitor for pain   Hip Extension Right;10 reps;1 set  contract gluteal first   Hip Extension Limitations monitor with pain     Modalities   Modalities Electrical Stimulation;Moist Heat     Moist Heat Therapy   Number Minutes Moist Heat 15 Minutes   Moist Heat Location Other (comment)     Electrical Stimulation   Electrical Stimulation Location bil. hamstring  bil. hamstring in prone   Electrical Stimulation Action IFC   Electrical Stimulation Parameters 15 minutes   Electrical Stimulation Goals Pain     Manual Therapy   Manual Therapy Soft tissue mobilization;Myofascial release   Soft tissue mobilization right hamstring, iliotibial band , and around the greater trochanter                  PT Short Term Goals - 06/18/17 6333      PT SHORT TERM GOAL #1   Title Independent with flexibility exercises  Time 4   Period Weeks   Status Achieved     PT SHORT TERM GOAL #2   Title pain with sitting and driving decreased >/= 25% due to decreased trigger points in right hamstring   Time 4   Period Weeks   Status Achieved     PT SHORT TERM GOAL #3   Title pain with stairs decreased >/= 25% due to right hip extension strength has increased to 3+/5   Time 4   Period Weeks   Status Achieved           PT Long Term Goals - 06/13/17 1130      PT LONG TERM GOAL #1   Title independent with HEP and how to progress herself   Time 8   Period Weeks   Status On-going     PT LONG TERM GOAL #2   Title pain with driving and sitting decreased >/= 75% due to improve right hamstring mobility and strength   Time 8   Period Weeks   Status On-going     PT LONG TERM GOAL #3   Title pain with power walking decreased >/= 75% after 30 minutes due to increased endurance of right hamstring   Time 8   Period Weeks    Status On-going     PT LONG TERM GOAL #4   Title FOTO score is </= 37% limitation   Time 8   Period Weeks   Status On-going               Plan - 06/20/17 1112    Clinical Impression Statement Pt with Lt gluteal pain now that is limiting mobility.  Pt reports that Rt LE is feeling much better. Pt limited with exercise today that activates the Rt hamstring so fewer reps complete. Pt will contact MD to see if we can treat Lt gluteals with dry needling.  Pt with improved tissue mobility of Rt hamstring after manual therapy.  Pt will continue to benefit from skilled PT for tissue mobility, strength, and flexibility.     Rehab Potential Excellent   PT Frequency 2x / week   PT Duration 8 weeks   PT Treatment/Interventions Cryotherapy;Electrical Stimulation;Iontophoresis 4mg /ml Dexamethasone;Moist Heat;Ultrasound;Patient/family education;Neuromuscular re-education;Therapeutic exercise;Therapeutic activities;Manual techniques;Dry needling;Taping;Energy conservation   PT Next Visit Plan dry needling to Lt gluteals if MD sends order. massage the right hamstring, isometrics to right hamstring, dead lift in pain free range; modalities as needed   Consulted and Agree with Plan of Care Patient      Patient will benefit from skilled therapeutic intervention in order to improve the following deficits and impairments:  Difficulty walking, Increased fascial restricitons, Decreased endurance, Decreased activity tolerance, Decreased strength, Impaired flexibility, Pain, Increased muscle spasms  Visit Diagnosis: Cramp and spasm  Muscle weakness (generalized)     Problem List Patient Active Problem List   Diagnosis Date Noted  . Tear of right hamstring 05/30/2017  . Abnormal leg finding 07/07/2015  . Myofascial pain dysfunction syndrome 11/21/2012  . Piriformis syndrome of right side 06/20/2012  . Nonallopathic lesion of lumbosacral region 06/20/2012  . Plantar fasciitis 04/17/2011  .  Acquired hallux rigidus 04/17/2011     Sigurd Sos, PT 06/20/17 11:45 AM  [facility] [address] [place], [place], [address] Phone: [phone]   Fax:  [phone]  Name: YARLIN BREISCH MRN: [id number] Date of Birth: 04-07-1956

## 2017-06-25 ENCOUNTER — Ambulatory Visit: Payer: 59

## 2017-06-25 ENCOUNTER — Other Ambulatory Visit: Payer: Self-pay | Admitting: *Deleted

## 2017-06-25 DIAGNOSIS — R252 Cramp and spasm: Secondary | ICD-10-CM | POA: Diagnosis not present

## 2017-06-25 DIAGNOSIS — S76302S Unspecified injury of muscle, fascia and tendon of the posterior muscle group at thigh level, left thigh, sequela: Secondary | ICD-10-CM

## 2017-06-25 DIAGNOSIS — M25551 Pain in right hip: Secondary | ICD-10-CM

## 2017-06-25 DIAGNOSIS — M6281 Muscle weakness (generalized): Secondary | ICD-10-CM

## 2017-06-25 DIAGNOSIS — M25552 Pain in left hip: Secondary | ICD-10-CM

## 2017-06-25 NOTE — Therapy (Addendum)
[facility] [address] [place], [place], [address] Phone: [phone]   Fax:  [phone]  Physical Therapy Treatment  Patient Details  Name: Kristin Andersen MRN: [id number] Date of Birth: September 10, 1956 Referring Provider: Dr. Hector Shade  Encounter Date: 06/25/2017      PT End of Session - 06/25/17 1100    Visit Number 7   Number of Visits 21   Date for PT Re-Evaluation 08/20/17   Authorization - Visit Number 7   Authorization - Number of Visits 21   PT Start Time 1019   PT Stop Time 1111  dry needling   PT Time Calculation (min) 52 min   Activity Tolerance Patient tolerated treatment well   Behavior During Therapy [facility] for tasks assessed/performed      Past Medical History:  Diagnosis Date  . Hypothyroidism   . IC (interstitial cystitis)   . Myofascial pain dysfunction syndrome    Right side  . Osteoporosis   . Premature menopause   . Stomach ulcer   . Thyroid disease     Past Surgical History:  Procedure Laterality Date  . CERVICAL CONE BIOPSY    . CERVIX LESION DESTRUCTION    . CESAREAN SECTION    . CHOLECYSTECTOMY    . DILATION AND CURETTAGE OF UTERUS    . ROOT CANAL  11/13   3 on 2 teeth  . ROOT CANAL  04/16/14 and 04/27/14   x2  . TONSILLECTOMY      There were no vitals filed for this visit.      Subjective Assessment - 06/25/17 1018    Subjective Pt with continued Lt gluteal and hamstring pain.  MD sent new order to assess this body part.     Patient Stated Goals understand how to exercise correctly, reduce pain and increase strength, make lump smaller.  Reduce Lt hip/hamstring pain, sit longer with lessp in   Currently in Pain? Yes   Pain Score 2    Pain Location Leg   Pain Orientation Right   Pain Descriptors / Indicators Aching   Pain Type Chronic pain   Pain Onset More than a month ago   Pain Frequency Intermittent   Aggravating Factors  sitting on hard chair   Pain Relieving  Factors no change   Multiple Pain Sites Yes   Pain Score 4  4-6/10   Pain Location Hip  gluteals and hamstring   Pain Orientation Left   Pain Descriptors / Indicators Aching;Sharp   Pain Type Acute pain   Pain Radiating Towards Rt gluteals, hamstring to the ankle   Pain Onset 1 to 4 weeks ago   Pain Frequency Constant   Aggravating Factors  sitting too long, sitting with weight through the Lt gluteals   Pain Relieving Factors sitting with weight through the Rt hip            [facility] PT Assessment - 06/25/17 0001      Assessment   Medical Diagnosis S76.301D Right hamstring injury, sebsequent encounter, hamstring injury, Lt sequela   Onset Date/Surgical Date 05/04/17  Lt 06/04/17     Precautions   Precautions Other (comment)   Precaution Comments osteoporosis     Balance Screen   Has the patient fallen in the past 6 months No   Has the patient had a decrease in activity level because of a fear of falling?  No   Is the patient reluctant to leave their home because of a  fear of falling?  No     Prior Function   Level of Independence Independent   Leisure not exercise due to right leg pain     Cognition   Overall Cognitive Status Within Functional Limits for tasks assessed     Observation/Other Assessments   Focus on Therapeutic Outcomes (FOTO)  50% limitation  goal is 37% limitation (Rt LE)     Strength   Right/Left Hip Right;Left   Right Hip Extension 4-/5   Right Hip ABduction 4+/5   Left Hip Extension 4/5   Left Hip ABduction 4+/5   Right/Left Knee Right;Left   Right Knee Flexion 4/5   Right Knee Extension 4+/5   Left Knee Flexion 4+/5   Left Knee Extension 4+/5     Palpation   Palpation comment tenderness located in medial hamstring, left hip adductor, trigger points in Lt gluteals, piriformis and proximal hamstrings     Ambulation/Gait   Ambulation/Gait Yes   Gait Pattern Within Functional Limits                     [facility] Adult PT  Treatment/Exercise - 06/25/17 0001      Lumbar Exercises: Supine   Other Supine Lumbar Exercises single knee to chest and diagonal knee to chest 3x20 seconds bil.       Moist Heat Therapy   Number Minutes Moist Heat 15 Minutes   Moist Heat Location Hip     Manual Therapy   Manual Therapy Soft tissue mobilization;Myofascial release   Soft tissue mobilization Lt gluteals and ITB          Trigger Point Dry Needling - 06/25/17 1038    Consent Given? Yes   Muscles Treated Lower Body Gluteus maximus;Gluteus minimus;Piriformis  Lt side   Gluteus Maximus Response Twitch response elicited;Palpable increased muscle length   Gluteus Minimus Response Twitch response elicited;Palpable increased muscle length   Piriformis Response Twitch response elicited;Palpable increased muscle length   Hamstring Response --                PT Short Term Goals - 06/18/17 2641      PT SHORT TERM GOAL #1   Title Independent with flexibility exercises   Time 4   Period Weeks   Status Achieved     PT SHORT TERM GOAL #2   Title pain with sitting and driving decreased >/= 25% due to decreased trigger points in right hamstring   Time 4   Period Weeks   Status Achieved     PT SHORT TERM GOAL #3   Title pain with stairs decreased >/= 25% due to right hip extension strength has increased to 3+/5   Time 4   Period Weeks   Status Achieved           PT Long Term Goals - 06/25/17 1022      PT LONG TERM GOAL #1   Title independent with HEP and how to progress herself   Time 8   Period Weeks   Status On-going     PT LONG TERM GOAL #2   Title pain with driving and sitting decreased >/= 75% due to improve right and left hamstring mobility and strength   Baseline Rt side 30% improved, Lt hip is limiting   Time 8   Period Weeks   Status On-going     PT LONG TERM GOAL #3   Title pain with power walking decreased >/= 75% after 30 minutes due to  increased endurance of right and Lt hamstring    Baseline not beend doing this   Time 8   Period Weeks   Status On-going     PT LONG TERM GOAL #4   Title FOTO score is </= 37% limitation   Time 8   Period Weeks   Status On-going     PT LONG TERM GOAL #5   Title return to gentle weight training with lower body with understanding of safe progression   Time 8   Period Weeks   Status New     Additional Long Term Goals   Additional Long Term Goals Yes     PT LONG TERM GOAL #6   Title sit with neutral seated posture without limitation   Time 8   Period Weeks   Status New               Plan - 06/25/17 1031    Clinical Impression Statement Pt with onset of Lt hip/gluteal and hamstring pain that began 06/04/17 without cause.  Pt has been most limited by this and Rt hip is 30% improved since the start of care.  Pt sits with Rt>Lt weight beraing due to Lt hip pain.  Pt with tension and trigger points in Lt gluteals and proximal hamstrings.  Pt has not been exercising due to pain.  Pt reports 4-6/10 Lt gluteal pain.  Pt will benefit from skilled PT for gluteal strength and flexibilty, dry needling to Lt gluteals and hamstrings if helpful     Rehab Potential Excellent   PT Frequency 2x / week   PT Duration 8 weeks   PT Treatment/Interventions Cryotherapy;Electrical Stimulation;Iontophoresis 77m/ml Dexamethasone;Moist Heat;Ultrasound;Patient/family education;Neuromuscular re-education;Therapeutic exercise;Therapeutic activities;Manual techniques;Dry needling;Taping;Energy conservation   PT Next Visit Plan assess response to dry needling on Lt, see what MD says regarding MRI, gentle strength and A/ROM.   Consulted and Agree with Plan of Care Patient      Patient will benefit from skilled therapeutic intervention in order to improve the following deficits and impairments:  Difficulty walking, Increased fascial restricitons, Decreased endurance, Decreased activity tolerance, Decreased strength, Impaired flexibility, Pain, Increased  muscle spasms  Visit Diagnosis: Muscle weakness (generalized) - Plan: PT plan of care cert/re-cert  Cramp and spasm - Plan: PT plan of care cert/re-cert  Pain in left hip - Plan: PT plan of care cert/re-cert  Pain in right hip - Plan: PT plan of care cert/re-cert     Problem List Patient Active Problem List   Diagnosis Date Noted  . Tear of right hamstring 05/30/2017  . Abnormal leg finding 07/07/2015  . Myofascial pain dysfunction syndrome 11/21/2012  . Piriformis syndrome of right side 06/20/2012  . Nonallopathic lesion of lumbosacral region 06/20/2012  . Plantar fasciitis 04/17/2011  . Acquired hallux rigidus 04/17/2011     KSigurd Sos PT 06/25/17 11:43 AM PHYSICAL THERAPY DISCHARGE SUMMARY  Visits from Start of Care: 7  Current functional level related to goals / functional outcomes: Pt attended 7 PT sessions.  Pt called to cancel all remaining appointments as she received a new diagnosis and will have to have surgery.     Remaining deficits: See above for current status.     Education / Equipment: HEP Plan: Patient agrees to discharge.  Patient goals were partially met. Patient is being discharged due to a change in medical status.  ?????        KSigurd Sos PT 06/26/17 11:32 AM   [facility] [address], [place], [place], [address] Phone: [phone]   Fax:  [phone]  Name: Kristin Andersen MRN: [id number] Date of Birth: 1956/10/07

## 2017-06-27 ENCOUNTER — Ambulatory Visit: Payer: 59 | Admitting: Sports Medicine

## 2017-07-04 ENCOUNTER — Encounter (HOSPITAL_COMMUNITY): Payer: Self-pay | Admitting: Family Medicine

## 2017-07-04 ENCOUNTER — Ambulatory Visit (HOSPITAL_COMMUNITY)
Admission: EM | Admit: 2017-07-04 | Discharge: 2017-07-04 | Disposition: A | Discharge status: Home / Self Care | Payer: 59 | Attending: Family Medicine | Admitting: Family Medicine

## 2017-07-04 ENCOUNTER — Encounter: Payer: 59 | Admitting: Physical Therapy

## 2017-07-04 DIAGNOSIS — J321 Chronic frontal sinusitis: Secondary | ICD-10-CM

## 2017-07-04 MED ORDER — AZITHROMYCIN 250 MG PO TABS: 250.0000 mg | ORAL_TABLET | Freq: Every day | ORAL | 0 refills | Status: DC

## 2017-07-04 NOTE — ED Provider Notes (Signed)
  [facility]   [id number] 07/04/17 Arrival Time: 5929   SUBJECTIVE:  Kristin Andersen is a 61 y.o. female who presents to the urgent care  with complaint of right ear pain, and pressure within the ear, along with congestion, sinus pain. She does have a history of having water in the ear, she went [place] this past [place] with her grandchildren. Denies fever or chills, denies any dental pain, no cough, no nausea, vomiting, or diarrhea.  ROS: As per HPI, remainder of ROS negative.   OBJECTIVE:  Vitals:   07/04/17 1037  BP: 115/75  Pulse: 73  Resp: 18  Temp: 97.9 F (36.6 C)  TempSrc: Oral  SpO2: 100%     General appearance: alert; no distress HEENT: normocephalic; atraumatic; conjunctivae normal; Right frontal sinus tenderness, turbinates normal, tympanic membrane gray, pearly, without abnormality, tonsils +1, no erythema, edema, or exudate. No pre-or postauricular lymphadenopathy, no tonsillar lymphadenopathy, no submandibular lymphadenopathy Neck: Trachea midline, no cervical lymphadenopathy Lungs: clear to auscultation bilaterally Heart: regular rate and rhythm Abdomen: soft, non-tender; bowel sounds normal; no masses or organomegaly; no guarding or rebound tenderness Musculoskeletal:  Skin: warm and dry Neurologic: Grossly normal Psychological:  alert and cooperative; normal mood and affect     ASSESSMENT & PLAN:  1. Chronic frontal sinusitis     Meds ordered this encounter  Medications  . azithromycin (ZITHROMAX) 250 MG tablet    Sig: Take 1 tablet (250 mg total) by mouth daily. Take first 2 tablets together, then 1 every day until finished.    Dispense:  6 tablet    Refill:  0    Order Specific Question:   Supervising Provider    Answer:   Vanessa Kick [[id number]]    Reviewed expectations re: course of current medical issues. Questions answered. Outlined signs and symptoms indicating need for more acute intervention. Patient  verbalized understanding. After Visit Summary given.    Procedures:    Labs Reviewed - No data to display  No results found.  Allergies  Allergen Reactions  . Dust Mite Extract   . Mold Extract [Trichophyton]   . Amoxicillin     hives  . Gabapentin     Dizzy, nausea  . Robaxin [Methocarbamol]     Dizzy, nausea  . Levofloxacin Hives, Itching and Rash    PMHx, SurgHx, SocialHx, Medications, and Allergies were reviewed in the Visit Navigator and updated as appropriate.       Barnet Glasgow, NP 07/04/17 1125

## 2017-07-04 NOTE — ED Triage Notes (Signed)
Pt here for ear pain and chest congestion. [place].

## 2017-07-04 NOTE — Discharge Instructions (Signed)
You have acute sinusitis. I have prescribed Azithromycin, 2 tablets today, then one daily till finished. You may take Tylenol every 4 hours for fever, pain, or headache, not to exceed 4,000 mg a day, or you may take Ibuprofen every 6-8 hours, not to exceed 1600 mg a day. For congestion, you may use Flonase nasal spray 2 sprays, each nostril once a day, or a pseudoephedrine containing product daily. I also recommend Mucinex, or Mucinex DM twice daily with a full glass of water.

## 2017-07-09 ENCOUNTER — Other Ambulatory Visit: Payer: Self-pay

## 2017-07-09 ENCOUNTER — Encounter: Payer: 59 | Admitting: Physical Therapy

## 2017-07-09 HISTORY — PX: LIPOMA EXCISION: SHX5283

## 2017-07-18 ENCOUNTER — Encounter: Payer: Self-pay | Admitting: Physical Therapy

## 2017-07-18 ENCOUNTER — Ambulatory Visit: Payer: 59 | Attending: Orthopedic Surgery | Admitting: Physical Therapy

## 2017-07-18 ENCOUNTER — Ambulatory Visit: Payer: 59 | Admitting: Physical Therapy

## 2017-07-18 DIAGNOSIS — M79651 Pain in right thigh: Secondary | ICD-10-CM | POA: Diagnosis present

## 2017-07-18 DIAGNOSIS — R252 Cramp and spasm: Secondary | ICD-10-CM | POA: Diagnosis present

## 2017-07-18 DIAGNOSIS — M6281 Muscle weakness (generalized): Secondary | ICD-10-CM

## 2017-07-18 NOTE — Therapy (Signed)
[facility] [address], [place], [place], [address] Phone: [phone]   Fax:  [phone]  Physical Therapy Evaluation  Patient Details  Name: Kristin Andersen MRN: [id number] Date of Birth: 09-Jan-1956 Referring Provider: Olivia Mackie A. Shuford PA-C  Encounter Date: 07/18/2017      PT End of Session - 07/18/17 1053    Visit Number 1   Date for PT Re-Evaluation 09/12/17   Authorization Type patient has used 7 visits and has 13 left   Authorization - Visit Number 1   Authorization - Number of Visits 13   PT Start Time 1015   PT Stop Time 1052   PT Time Calculation (min) 37 min   Activity Tolerance Patient tolerated treatment well;Patient limited by pain   Behavior During Therapy WFL for tasks assessed/performed      Past Medical History:  Diagnosis Date  . Hypothyroidism   . IC (interstitial cystitis)   . Myofascial pain dysfunction syndrome    Right side  . Osteoporosis   . Premature menopause   . Stomach ulcer   . Thyroid disease     Past Surgical History:  Procedure Laterality Date  . CERVICAL CONE BIOPSY    . CERVIX LESION DESTRUCTION    . CESAREAN SECTION    . CHOLECYSTECTOMY    . DILATION AND CURETTAGE OF UTERUS    . ROOT CANAL  11/13   3 on 2 teeth  . ROOT CANAL  04/16/14 and 04/27/14   x2  . TONSILLECTOMY      There were no vitals filed for this visit.       Subjective Assessment - 07/18/17 1022    Subjective I had surgery on 07/09/2017 to remove a lipoma.  I had the bandage off yesterday.  I have some scabbing.    How long can you sit comfortably? painful with driving   How long can you stand comfortably? no pain   How long can you walk comfortably? walk with a limp, stairs with step over step pattern   Patient Stated Goals have right leg working and go back to exercise and yoga   Currently in Pain? Yes   Pain Score 2    Pain Location Leg  hamstring   Pain Orientation Right;Posterior   Pain Descriptors / Indicators Aching;Dull   Pain Type Acute pain   Pain Onset 1 to 4 weeks ago   Pain Frequency Intermittent   Aggravating Factors  sitting with pressure on the hamstring; walking   Pain Relieving Factors sitting with right leg propped on pillow   Multiple Pain Sites No            [facility] PT Assessment - 07/18/17 0001      Assessment   Medical Diagnosis lipoma of right thigh s/p excision   Referring Provider Olivia Mackie A. Shuford PA-C   Onset Date/Surgical Date 07/09/17   Prior Therapy yes     Precautions   Precautions Other (comment)   Precaution Comments osteoporosis     Restrictions   Weight Bearing Restrictions No     Balance Screen   Has the patient fallen in the past 6 months No   Has the patient had a decrease in activity level because of a fear of falling?  No   Is the patient reluctant to leave their home because of a fear of falling?  No     Prior Function   Level of Independence Independent   Leisure yoga, exercise,  walking     Cognition   Overall Cognitive Status Within Functional Limits for tasks assessed     Observation/Other Assessments   Observations bruising noted in the right hamstring area from surgery   Skin Integrity surgical site is healing with glue on top   Focus on Therapeutic Outcomes (FOTO)  55% limitation  goal is 35% limitation (Rt LE)     Posture/Postural Control   Posture/Postural Control No significant limitations     ROM / Strength   AROM / PROM / Strength AROM;Strength;PROM     AROM   Overall AROM Comments full right knee AROM     Strength   Right/Left Hip Right   Right Hip Flexion 4/5   Right Hip Extension 4/5   Right Hip External Rotation  4/5   Right Hip Internal Rotation 4/5   Right Hip ABduction 3+/5   Right Hip ADduction 3/5   Left Hip Extension 3/5   Right Knee Flexion 3+/5   Right Knee Extension 4/5     Palpation   SI assessment  right ilium anteriorly rotated   Palpation comment tenderness located  in right hamstring     Ambulation/Gait   Ambulation/Gait Yes   Gait Pattern Decreased stride length;Decreased dorsiflexion - right            Objective measurements completed on examination: See above findings.          [facility] Adult PT Treatment/Exercise - 07/18/17 0001      Manual Therapy   Manual Therapy Soft tissue mobilization   Soft tissue mobilization gentle soft tissue work to right hamstring going around the surgical site to reduce swelling and tightness                PT Education - 07/18/17 1046    Education provided Yes   Education Details stretches    Person(s) Educated Patient   Methods Explanation;Demonstration;Verbal cues;Handout   Comprehension Verbalized understanding;Returned demonstration          PT Short Term Goals - 06/18/17 1062      PT SHORT TERM GOAL #1   Title Independent with flexibility exercises   Time 4   Period Weeks   Status Achieved     PT SHORT TERM GOAL #2   Title pain with sitting and driving decreased >/= 25% due to decreased trigger points in right hamstring   Time 4   Period Weeks   Status Achieved     PT SHORT TERM GOAL #3   Title pain with stairs decreased >/= 25% due to right hip extension strength has increased to 3+/5   Time 4   Period Weeks   Status Achieved           PT Long Term Goals - 06/25/17 1022      PT LONG TERM GOAL #1   Title independent with HEP and how to progress herself   Time 8   Period Weeks   Status On-going     PT LONG TERM GOAL #2   Title pain with driving and sitting decreased >/= 75% due to improve right and left hamstring mobility and strength   Baseline Rt side 30% improved, Lt hip is limiting   Time 8   Period Weeks   Status On-going     PT LONG TERM GOAL #3   Title pain with power walking decreased >/= 75% after 30 minutes due to increased endurance of right and Lt hamstring   Baseline not beend doing  this   Time 8   Period Weeks   Status On-going     PT  LONG TERM GOAL #4   Title FOTO score is </= 37% limitation   Time 8   Period Weeks   Status On-going     PT LONG TERM GOAL #5   Title return to gentle weight training with lower body with understanding of safe progression   Time 8   Period Weeks   Status New     Additional Long Term Goals   Additional Long Term Goals Yes     PT LONG TERM GOAL #6   Title sit with neutral seated posture without limitation   Time 8   Period Weeks   Status New                Plan - 07/18/17 1054    Clinical Impression Statement Patient has a limpoma removed on 07/09/2017 that was entwined into the right hamstring.  Patient saw the doctor yesterday and the bandage was removed.  Patient reports her pain is 4/10 with sitting, walking and using the right hamstring. Patient walks with a flexed posture, decreased right heel strike and decreased right hip and knee extension.  Patient has tenderness located in right hamstring and bruising on the skin.  Patient right ilium is rotated anteriorly.  Patient has weakness in right hip and knee. Patient has a tight right hamstring and gastroc.  Patient will benfit from skilled therapy to improve strength and reduce pain so patient is able to sit and return to her exercise routine.    History and Personal Factors relevant to plan of care: myofascial pain syndrom, hypothyroidism, s/p limpoma removed on 07/09/2017   Clinical Presentation due to: patient is not able to resume her activities and has difficulty with walking   Clinical Decision Making Moderate   Rehab Potential Excellent   Clinical Impairments Affecting Rehab Potential myofascial pain syndrom, hypothyroidism, s/p limpoma removed on 07/09/2017   PT Frequency 2x / week   PT Duration 8 weeks   PT Treatment/Interventions Cryotherapy;Electrical Stimulation;Moist Heat;Ultrasound;Patient/family education;Neuromuscular re-education;Therapeutic exercise;Therapeutic activities;Manual techniques;Dry  needling;Taping;Energy conservation   PT Next Visit Plan stretch right hamstring and gastroc, nustep, scar massage education, hip and knee strength, gentle massage, e-stim   PT Home Exercise Plan progress as needed   Consulted and Agree with Plan of Care Patient      Patient will benefit from skilled therapeutic intervention in order to improve the following deficits and impairments:  Difficulty walking, Increased fascial restricitons, Decreased endurance, Decreased activity tolerance, Decreased strength, Impaired flexibility, Pain, Increased muscle spasms  Visit Diagnosis: Muscle weakness (generalized) - Plan: PT plan of care cert/re-cert  Cramp and spasm - Plan: PT plan of care cert/re-cert  Pain of right thigh - Plan: PT plan of care cert/re-cert     Problem List Patient Active Problem List   Diagnosis Date Noted  . Tear of right hamstring 05/30/2017  . Abnormal leg finding 07/07/2015  . Myofascial pain dysfunction syndrome 11/21/2012  . Piriformis syndrome of right side 06/20/2012  . Nonallopathic lesion of lumbosacral region 06/20/2012  . Plantar fasciitis 04/17/2011  . Acquired hallux rigidus 04/17/2011    Earlie Counts, PT 07/18/17 11:10 AM    [facility] [address], [place], [place], [address] Phone: [phone]   Fax:  [phone]  Name: EMBERLIN VERNER MRN: [id number] Date of Birth: Jun 26, 1956

## 2017-07-18 NOTE — Patient Instructions (Signed)
Hip Flexor, Quadricep Stretch: Belly Down (Strap)    Engage stable pelvic tilt. Hold top of foot with hand or strap. Hold for _30___ breaths. Repeat __2__ times each leg.  Copyright  VHI. All rights reserved.   Gastroc, Sitting (Passive)    Sit with strap or towel around ball of foot. Gently pull toward body. Hold 30___ seconds.  Repeat _2__ times per session. Do _1__ sessions per day.  Copyright  VHI. All rights reserved.  Chair Sitting    Sit at edge of seat, spine straight, one leg extended. Put a hand on each thigh and bend forward from the hip, keeping spine straight. Allow hand on extended leg to reach toward toes. Support upper body with other arm. Hold _30__ seconds. Repeat _2__ times per session. Do __1_ sessions per day.  Copyright  VHI. All rights reserved.   [facility] [address], [place], [place] [address] Phone # [phone] Fax [phone]

## 2017-07-23 ENCOUNTER — Encounter: Payer: 59 | Admitting: Physical Therapy

## 2017-07-24 ENCOUNTER — Ambulatory Visit: Payer: 59 | Attending: Orthopedic Surgery | Admitting: Physical Therapy

## 2017-07-24 ENCOUNTER — Encounter: Payer: 59 | Admitting: Physical Therapy

## 2017-07-24 DIAGNOSIS — M6281 Muscle weakness (generalized): Secondary | ICD-10-CM | POA: Diagnosis present

## 2017-07-24 DIAGNOSIS — M79651 Pain in right thigh: Secondary | ICD-10-CM | POA: Diagnosis present

## 2017-07-24 DIAGNOSIS — R252 Cramp and spasm: Secondary | ICD-10-CM | POA: Diagnosis present

## 2017-07-24 NOTE — Therapy (Signed)
[facility] [address] [place], [place], [address] Phone: [phone]   Fax:  [phone]  Physical Therapy Treatment  Patient Details  Name: Kristin Andersen MRN: [id number] Date of Birth: Aug 02, 1956 Referring Provider: Olivia Mackie A. Shuford PA-C  Encounter Date: 07/24/2017      PT End of Session - 07/24/17 1324    Visit Number 2   Number of Visits 21   Date for PT Re-Evaluation 09/12/17   Authorization Type patient has used 7 visits and has 13 left   Authorization - Visit Number 2   Authorization - Number of Visits 13   PT Start Time [id number]   PT Stop Time 1012   PT Time Calculation (min) 38 min   Activity Tolerance Patient tolerated treatment well;Patient limited by pain   Behavior During Therapy WFL for tasks assessed/performed      Past Medical History:  Diagnosis Date  . Hypothyroidism   . IC (interstitial cystitis)   . Myofascial pain dysfunction syndrome    Right side  . Osteoporosis   . Premature menopause   . Stomach ulcer   . Thyroid disease     Past Surgical History:  Procedure Laterality Date  . CERVICAL CONE BIOPSY    . CERVIX LESION DESTRUCTION    . CESAREAN SECTION    . CHOLECYSTECTOMY    . DILATION AND CURETTAGE OF UTERUS    . ROOT CANAL  11/13   3 on 2 teeth  . ROOT CANAL  04/16/14 and 04/27/14   x2  . TONSILLECTOMY      There were no vitals filed for this visit.      Subjective Assessment - 07/24/17 0954    Subjective I am getting better.  Still gets sore when I stand a long time.  I haven't been doing much walking.  I have been doing the stretches   Limitations Sitting   How long can you sit comfortably? painful with driving   How long can you stand comfortably? no pain   How long can you walk comfortably? walk with a limp, stairs with step over step pattern   Patient Stated Goals have right leg working and go back to exercise and yoga   Currently in Pain? Yes   Pain Score 1     Pain Location Leg   Pain Orientation Right;Posterior   Pain Descriptors / Indicators Aching   Pain Type Acute pain;Surgical pain   Pain Onset 1 to 4 weeks ago   Pain Frequency Intermittent   Aggravating Factors  sitting and activities throughout the day   Pain Relieving Factors elevating Right leg   Multiple Pain Sites No                         [facility] Adult PT Treatment/Exercise - 07/24/17 0001      Knee/Hip Exercises: Stretches   Active Hamstring Stretch Right;Left;3 reps;20 seconds   Quad Stretch Right;Left;3 reps;20 seconds  prone with strap   Gastroc Stretch Right;Left;3 reps;20 seconds     Knee/Hip Exercises: Aerobic   Stationary Bike lvl 0 x 6 min  PT present to discuss progress     Knee/Hip Exercises: Standing   Hip Abduction Stengthening;Right;Left;2 sets;10 reps   Forward Step Up Right;Left;10 reps     Knee/Hip Exercises: Prone   Hamstring Curl 2 sets;10 reps  standing                PT  Education - 07/24/17 1010    Education provided Yes   Education Details stretches and initial HEP   Person(s) Educated Patient   Methods Explanation;Demonstration;Handout   Comprehension Verbalized understanding;Returned demonstration          PT Short Term Goals - 06/18/17 0808      PT SHORT TERM GOAL #1   Title Independent with flexibility exercises   Time 4   Period Weeks   Status Achieved     PT SHORT TERM GOAL #2   Title pain with sitting and driving decreased >/= 25% due to decreased trigger points in right hamstring   Time 4   Period Weeks   Status Achieved     PT SHORT TERM GOAL #3   Title pain with stairs decreased >/= 25% due to right hip extension strength has increased to 3+/5   Time 4   Period Weeks   Status Achieved           PT Long Term Goals - 07/24/17 0944      PT LONG TERM GOAL #1   Title independent with HEP and how to progress herself   Time 8   Period Weeks   Status On-going     PT LONG TERM GOAL #2    Title pain with driving and sitting decreased >/= 75% due to improve right and left hamstring mobility and strength   Time 8   Period Weeks   Status On-going     PT LONG TERM GOAL #3   Title pain with power walking decreased >/= 75% after 30 minutes due to increased endurance of right and Lt hamstring   Time 8   Period Weeks   Status On-going               Plan - 07/24/17 4967    Clinical Impression Statement Patient was able to do exercises and experienced fatigue needing some breaks throughout.  Pt is doing well with low level strengthening but will continue to need skilled PT for strength and endurance for getting back to normal activity level.   Rehab Potential Excellent   Clinical Impairments Affecting Rehab Potential myofascial pain syndrom, hypothyroidism, s/p limpoma removed on 07/09/2017   PT Treatment/Interventions Cryotherapy;Electrical Stimulation;Moist Heat;Ultrasound;Patient/family education;Neuromuscular re-education;Therapeutic exercise;Therapeutic activities;Manual techniques;Dry needling;Taping;Energy conservation   PT Next Visit Plan stretch right hamstring and gastroc, nustep, scar massage education, hip and knee strength, gentle massage, e-stim   PT Home Exercise Plan progress as needed   Consulted and Agree with Plan of Care Patient      Patient will benefit from skilled therapeutic intervention in order to improve the following deficits and impairments:  Difficulty walking, Increased fascial restricitons, Decreased endurance, Decreased activity tolerance, Decreased strength, Impaired flexibility, Pain, Increased muscle spasms  Visit Diagnosis: Muscle weakness (generalized)  Cramp and spasm  Pain of right thigh     Problem List Patient Active Problem List   Diagnosis Date Noted  . Tear of right hamstring 05/30/2017  . Abnormal leg finding 07/07/2015  . Myofascial pain dysfunction syndrome 11/21/2012  . Piriformis syndrome of right side  06/20/2012  . Nonallopathic lesion of lumbosacral region 06/20/2012  . Plantar fasciitis 04/17/2011  . Acquired hallux rigidus 04/17/2011    Zannie Cove, PT 07/24/2017, 10:21 AM  [facility] [address], [place], [place], [address] Phone: [phone]   Fax:  [phone]  Name: Kristin Andersen MRN: [id number] Date of Birth: 10/14/1956

## 2017-07-24 NOTE — Patient Instructions (Addendum)
   FRONT STEP-UPS  Step up onto stool or step with involved leg.  Step down leading with uninvolved leg.  Repeat 2 sets of 10    Standing Hip Abduction   Stand up straight, bend front knee a little bit and lift other leg out to the side, keep the toes pointing forward as you lift out to the side. Hold onto stairs for balance if needed. Repeat 2 sets of 10 reps     STANDING HAMSTRING STRETCH - PROPPED  Start by standing and prop your foot of the affected leg on a chair or a step.   Next, slowly lean forward until a stretch is felt behind your knee/thigh. Bend through your hips and not your spine. Hold 20 seconds, then return to starting position and repeat 3x   PRONE HAMSTRING CURLS  While lying face down, slowly bend your knee as you bring your foot towards your buttock. Bring it slowly back down to starting position Repeat 20x

## 2017-07-25 ENCOUNTER — Ambulatory Visit: Payer: 59 | Admitting: Sports Medicine

## 2017-07-25 ENCOUNTER — Encounter: Payer: 59 | Admitting: Physical Therapy

## 2017-07-31 ENCOUNTER — Ambulatory Visit: Payer: 59 | Admitting: Physical Therapy

## 2017-07-31 ENCOUNTER — Encounter: Payer: Self-pay | Admitting: Physical Therapy

## 2017-07-31 DIAGNOSIS — M6281 Muscle weakness (generalized): Secondary | ICD-10-CM | POA: Diagnosis not present

## 2017-07-31 DIAGNOSIS — R252 Cramp and spasm: Secondary | ICD-10-CM

## 2017-07-31 DIAGNOSIS — M79651 Pain in right thigh: Secondary | ICD-10-CM

## 2017-07-31 NOTE — Therapy (Signed)
[facility] [address] [place], [place], [address] Phone: [phone]   Fax:  [phone]  Physical Therapy Treatment  Patient Details  Name: Kristin Andersen MRN: [id number] Date of Birth: [birth year] Referring Provider: Olivia Mackie A. Shuford PA-C  Encounter Date: 07/31/2017      PT End of Session - 07/31/17 0929    Visit Number 3   Date for PT Re-Evaluation 09/12/17   Authorization Type patient has used 7 visits and has 13 left   Authorization - Visit Number 3   Authorization - Number of Visits 13   PT Start Time 0845   PT Stop Time 0932   PT Time Calculation (min) 47 min   Activity Tolerance Patient tolerated treatment well   Behavior During Therapy WFL for tasks assessed/performed      Past Medical History:  Diagnosis Date  . Hypothyroidism   . IC (interstitial cystitis)   . Myofascial pain dysfunction syndrome    Right side  . Osteoporosis   . Premature menopause   . Stomach ulcer   . Thyroid disease     Past Surgical History:  Procedure Laterality Date  . CERVICAL CONE BIOPSY    . CERVIX LESION DESTRUCTION    . CESAREAN SECTION    . CHOLECYSTECTOMY    . DILATION AND CURETTAGE OF UTERUS    . ROOT CANAL  11/13   3 on 2 teeth  . ROOT CANAL  04/16/14 and 04/27/14   x2  . TONSILLECTOMY      There were no vitals filed for this visit.      Subjective Assessment - 07/31/17 0848    Subjective I am getting better.  I feel real tight.  The pain is 80% since the initial evaluation.  I still have the scabs.  I can almost walk normally.  I have more trouble going up the stairs.  I am not able to carry groceries.    Limitations Sitting   How long can you sit comfortably? painful with driving   How long can you stand comfortably? no pain   How long can you walk comfortably? walk with a limp, stairs with step over step pattern   Patient Stated Goals have right leg working and go back to exercise and yoga    Currently in Pain? Yes   Pain Score 1    Pain Location Leg   Pain Orientation Right;Posterior   Pain Descriptors / Indicators Aching   Pain Type Acute pain;Surgical pain   Pain Onset 1 to 4 weeks ago   Pain Frequency Intermittent   Aggravating Factors  sitting and activities throughout the day   Pain Relieving Factors elevating right leg   Multiple Pain Sites No                         [facility] Adult PT Treatment/Exercise - 07/31/17 0001      Knee/Hip Exercises: Stretches   Active Hamstring Stretch Right;Left;3 reps;20 seconds   Active Hamstring Stretch Limitations sitting   Passive Hamstring Stretch Limitations with strap pull lateral, medial and upward holding 30 seconds   Quad Stretch Right;Left;3 reps;20 seconds  prone with strap   Piriformis Stretch Right;1 rep;30 seconds  with strap   Gastroc Stretch Right;Left;3 reps;20 seconds     Knee/Hip Exercises: Aerobic   Stationary Bike lvl 0 x 6 min; pillow behind patient back  PT present to discuss progress     Knee/Hip  Exercises: Supine   Bridges Limitations 10 times   Bridges with Clamshell Strengthening;Both;2 sets;10 reps     Knee/Hip Exercises: Prone   Hamstring Curl 2 sets;10 reps;1 second;Other (comment)  2#   Hip Extension Strengthening;Right;2 sets;10 reps  squeeze buttocks then lift     Manual Therapy   Manual Therapy Soft tissue mobilization   Soft tissue mobilization right hamstring; sides of the scar in prone                  PT Short Term Goals - 07/31/17 0920      PT SHORT TERM GOAL #1   Title Independent with flexibility exercises   Time 4   Period Weeks   Status Achieved     PT SHORT TERM GOAL #2   Title pain with sitting and driving decreased >/= 25% due to decreased trigger points in right hamstring   Time 4   Period Weeks   Status Achieved     PT SHORT TERM GOAL #3   Title pain with stairs decreased >/= 25% due to right hip extension strength has increased to 3+/5    Time 4   Period Weeks   Status Achieved           PT Long Term Goals - 07/31/17 0920      PT LONG TERM GOAL #1   Title independent with HEP and how to progress herself   Time 8   Period Weeks   Status On-going     PT LONG TERM GOAL #2   Title pain with driving and sitting decreased >/= 75% due to improve right and left hamstring mobility and strength   Baseline Rt side 60% improved, Lt hip is limiting   Time 8   Period Weeks   Status On-going     PT LONG TERM GOAL #3   Title pain with power walking decreased >/= 75% after 30 minutes due to increased endurance of right and Lt hamstring   Baseline not beend doing this   Time 8   Status On-going     PT LONG TERM GOAL #4   Title FOTO score is </= 37% limitation   Time 8   Period Weeks   Status On-going     PT LONG TERM GOAL #5   Title return to gentle weight training with lower body with understanding of safe progression   Baseline just starting   Time 8   Status On-going     PT LONG TERM GOAL #6   Title sit with neutral seated posture without limitation   Time 8   Period Weeks   Status On-going               Plan - 07/31/17 7341    Clinical Impression Statement Patient has started to exercise with her hamstring.  After therapy the posterior thigh had increased mobility.  Patient reports her pain decreased by 80%. Patient will benefit from skilled therapy to reduce pain and improve normal activity level.    Rehab Potential Excellent   Clinical Impairments Affecting Rehab Potential myofascial pain syndrom, hypothyroidism, s/p limpoma removed on 07/09/2017   PT Frequency 2x / week   PT Duration 8 weeks   PT Treatment/Interventions Cryotherapy;Electrical Stimulation;Moist Heat;Ultrasound;Patient/family education;Neuromuscular re-education;Therapeutic exercise;Therapeutic activities;Manual techniques;Dry needling;Taping;Energy conservation   PT Next Visit Plan stretch right hamstring and gastroc, nustep, scar  massage education, hip and knee strength, gentle massage   PT Home Exercise Plan progress as needed   Recommended  Other Services MD has signed initial summary   Consulted and Agree with Plan of Care Patient      Patient will benefit from skilled therapeutic intervention in order to improve the following deficits and impairments:  Difficulty walking, Increased fascial restricitons, Decreased endurance, Decreased activity tolerance, Decreased strength, Impaired flexibility, Pain, Increased muscle spasms  Visit Diagnosis: Muscle weakness (generalized)  Cramp and spasm  Pain of right thigh     Problem List Patient Active Problem List   Diagnosis Date Noted  . Tear of right hamstring 05/30/2017  . Abnormal leg finding 07/07/2015  . Myofascial pain dysfunction syndrome 11/21/2012  . Piriformis syndrome of right side 06/20/2012  . Nonallopathic lesion of lumbosacral region 06/20/2012  . Plantar fasciitis 04/17/2011  . Acquired hallux rigidus 04/17/2011    Earlie Counts, PT 07/31/17 9:31 AM   [facility] [address] [place], [place], [address] Phone: [phone]   Fax:  [phone]  Name: JEANETTA ALONZO MRN: [id number] Date of Birth: Jul 23, 1956

## 2017-08-02 ENCOUNTER — Encounter: Payer: 59 | Admitting: Physical Therapy

## 2017-08-06 ENCOUNTER — Ambulatory Visit: Payer: 59 | Admitting: Physical Therapy

## 2017-08-06 ENCOUNTER — Encounter: Payer: Self-pay | Admitting: Physical Therapy

## 2017-08-06 DIAGNOSIS — M6281 Muscle weakness (generalized): Secondary | ICD-10-CM | POA: Diagnosis not present

## 2017-08-06 DIAGNOSIS — M79651 Pain in right thigh: Secondary | ICD-10-CM

## 2017-08-06 DIAGNOSIS — R252 Cramp and spasm: Secondary | ICD-10-CM

## 2017-08-06 NOTE — Therapy (Signed)
[facility] [address] [place], [place], [address] Phone: [phone]   Fax:  [phone]  Physical Therapy Treatment  Patient Details  Name: Kristin Andersen MRN: [id number] Date of Birth: 1955/12/01 Referring Provider: Olivia Mackie A. Shuford PA-C  Encounter Date: 08/06/2017      PT End of Session - 08/06/17 1012    Visit Number 4   Number of Visits 21   Date for PT Re-Evaluation 09/12/17   Authorization Type patient has used 7 visits and has 13 left   Authorization - Visit Number 4   Authorization - Number of Visits 13   PT Start Time 0930   PT Stop Time 1010   PT Time Calculation (min) 40 min   Activity Tolerance Patient tolerated treatment well   Behavior During Therapy WFL for tasks assessed/performed      Past Medical History:  Diagnosis Date  . Hypothyroidism   . IC (interstitial cystitis)   . Myofascial pain dysfunction syndrome    Right side  . Osteoporosis   . Premature menopause   . Stomach ulcer   . Thyroid disease     Past Surgical History:  Procedure Laterality Date  . CERVICAL CONE BIOPSY    . CERVIX LESION DESTRUCTION    . CESAREAN SECTION    . CHOLECYSTECTOMY    . DILATION AND CURETTAGE OF UTERUS    . ROOT CANAL  11/13   3 on 2 teeth  . ROOT CANAL  04/16/14 and 04/27/14   x2  . TONSILLECTOMY      There were no vitals filed for this visit.      Subjective Assessment - 08/06/17 0936    Subjective The tissue work has helped my hamstring.  I have not walked due to the weather.  Tender to sit on. Pain level 1/10 when going up and down steps and go with step over step pattern.    Limitations Sitting   How long can you sit comfortably? painful with driving   How long can you stand comfortably? no pain   How long can you walk comfortably? walk with a limp, stairs with step over step pattern   Patient Stated Goals have right leg working and go back to exercise and yoga   Currently in  Pain? No/denies            [facility] PT Assessment - 08/06/17 0001      Observation/Other Assessments   Skin Integrity proximal part of scar is red and irritated with crusting and tender                     [facility] Adult PT Treatment/Exercise - 08/06/17 0001      Knee/Hip Exercises: Stretches   Active Hamstring Stretch Right;Left;3 reps;20 seconds   Active Hamstring Stretch Limitations sitting   Quad Stretch Right;Left;3 reps;20 seconds  prone with strap   Gastroc Stretch Right;Left;3 reps;20 seconds     Knee/Hip Exercises: Aerobic   Elliptical Level 1 x 4 min     Knee/Hip Exercises: Standing   Forward Step Up Right;1 set;10 reps;Step Height: 2"   Other Standing Knee Exercises lunge with right leg behind small movements iwth tactile cues to not drop hip     Manual Therapy   Manual Therapy Soft tissue mobilization   Soft tissue mobilization right hamstring; sides of the scar in prone; scar massage  PT Education - 08/06/17 1012    Education provided Yes   Education Details took a picture of her scar so she can see and show doctor   Methods Explanation   Comprehension Verbalized understanding          PT Short Term Goals - 07/31/17 0920      PT SHORT TERM GOAL #1   Title Independent with flexibility exercises   Time 4   Period Weeks   Status Achieved     PT SHORT TERM GOAL #2   Title pain with sitting and driving decreased >/= 25% due to decreased trigger points in right hamstring   Time 4   Period Weeks   Status Achieved     PT SHORT TERM GOAL #3   Title pain with stairs decreased >/= 25% due to right hip extension strength has increased to 3+/5   Time 4   Period Weeks   Status Achieved           PT Long Term Goals - 08/06/17 6314      PT LONG TERM GOAL #1   Title independent with HEP and how to progress herself   Baseline still learning   Time 8   Period Weeks   Status On-going     PT LONG TERM GOAL #2   Title  pain with driving and sitting decreased >/= 75% due to improve right and left hamstring mobility and strength   Baseline Rt side 60% improved, Lt hip is limiting   Time 8   Period Weeks   Status On-going     PT LONG TERM GOAL #3   Title pain with power walking decreased >/= 75% after 30 minutes due to increased endurance of right and Lt hamstring   Baseline not beend doing this   Time 8   Period Weeks   Status On-going     PT LONG TERM GOAL #4   Title FOTO score is </= 37% limitation   Period Weeks   Status On-going     PT LONG TERM GOAL #5   Title return to gentle weight training with lower body with understanding of safe progression   Baseline just starting   Time 8   Period Weeks   Status On-going     PT LONG TERM GOAL #6   Title sit with neutral seated posture without limitation   Time 8   Period Weeks   Status On-going               Plan - 08/06/17 9702    Clinical Impression Statement Patient is able to start more strengthening exercises.  Patient has decreased mobility of her scar.  Patient has a red and crusty area on the proximal area of the scar that she is going to have the doctor look at.  Patient will benefit from skilled therapy to reduce pain and improve normal activity level .    Rehab Potential Excellent   Clinical Impairments Affecting Rehab Potential myofascial pain syndrom, hypothyroidism, s/p limpoma removed on 07/09/2017   PT Frequency 2x / week   PT Duration 8 weeks   PT Treatment/Interventions Cryotherapy;Electrical Stimulation;Moist Heat;Ultrasound;Patient/family education;Neuromuscular re-education;Therapeutic exercise;Therapeutic activities;Manual techniques;Dry needling;Taping;Energy conservation   PT Next Visit Plan stretch right hamstring and gastroc, nustep, scar massage education, hip and knee strength, gentle massage; elliptical; see what MD says about the scar   PT Home Exercise Plan progress as needed   Consulted and Agree with Plan  of Care Patient  Patient will benefit from skilled therapeutic intervention in order to improve the following deficits and impairments:  Difficulty walking, Increased fascial restricitons, Decreased endurance, Decreased activity tolerance, Decreased strength, Impaired flexibility, Pain, Increased muscle spasms  Visit Diagnosis: Muscle weakness (generalized)  Cramp and spasm  Pain of right thigh     Problem List Patient Active Problem List   Diagnosis Date Noted  . Tear of right hamstring 05/30/2017  . Abnormal leg finding 07/07/2015  . Myofascial pain dysfunction syndrome 11/21/2012  . Piriformis syndrome of right side 06/20/2012  . Nonallopathic lesion of lumbosacral region 06/20/2012  . Plantar fasciitis 04/17/2011  . Acquired hallux rigidus 04/17/2011    Earlie Counts, PT 08/06/17 10:15 AM    [facility] [address], [place], [place], [address] Phone: [phone]   Fax:  [phone]  Name: Kristin Andersen MRN: [id number] Date of Birth: Jan 06, 1956

## 2017-08-08 ENCOUNTER — Ambulatory Visit: Payer: 59 | Admitting: Physical Therapy

## 2017-08-08 ENCOUNTER — Encounter: Payer: Self-pay | Admitting: Physical Therapy

## 2017-08-08 DIAGNOSIS — M6281 Muscle weakness (generalized): Secondary | ICD-10-CM

## 2017-08-08 DIAGNOSIS — R252 Cramp and spasm: Secondary | ICD-10-CM

## 2017-08-08 DIAGNOSIS — M79651 Pain in right thigh: Secondary | ICD-10-CM

## 2017-08-08 NOTE — Therapy (Signed)
[facility] [address] [place], [place], [address] Phone: [phone]   Fax:  [phone]  Physical Therapy Treatment  Patient Details  Name: Kristin Andersen MRN: [id number] Date of Birth: [birth year] Referring Provider: Olivia Mackie A. Shuford PA-C  Encounter Date: 08/08/2017      PT End of Session - 08/08/17 1543    Visit Number 5   Number of Visits 21   Date for PT Re-Evaluation 09/12/17   Authorization Type patient has used 7 visits and has 13 left   Authorization - Visit Number 5   Authorization - Number of Visits 13   PT Start Time 7026   PT Stop Time 1540   PT Time Calculation (min) 55 min   Activity Tolerance Patient tolerated treatment well   Behavior During Therapy WFL for tasks assessed/performed      Past Medical History:  Diagnosis Date  . Hypothyroidism   . IC (interstitial cystitis)   . Myofascial pain dysfunction syndrome    Right side  . Osteoporosis   . Premature menopause   . Stomach ulcer   . Thyroid disease     Past Surgical History:  Procedure Laterality Date  . CERVICAL CONE BIOPSY    . CERVIX LESION DESTRUCTION    . CESAREAN SECTION    . CHOLECYSTECTOMY    . DILATION AND CURETTAGE OF UTERUS    . ROOT CANAL  11/13   3 on 2 teeth  . ROOT CANAL  04/16/14 and 04/27/14   x2  . TONSILLECTOMY      There were no vitals filed for this visit.      Subjective Assessment - 08/08/17 1453    Subjective MD said it was a stitch coming through.  I was able to walk for 1 mile.    Limitations Sitting   How long can you sit comfortably? painful with driving   How long can you stand comfortably? no pain   How long can you walk comfortably? walk with a limp, stairs with step over step pattern   Patient Stated Goals have right leg working and go back to exercise and yoga   Currently in Pain? Yes   Pain Score 1    Pain Location Leg  hamstring   Pain Orientation Right;Posterior   Pain  Descriptors / Indicators Aching   Pain Type Acute pain;Surgical pain   Pain Onset 1 to 4 weeks ago   Pain Frequency Intermittent   Aggravating Factors  sitting and activities throughout the day.    Pain Relieving Factors elevated right leg   Multiple Pain Sites No                         [facility] Adult PT Treatment/Exercise - 08/08/17 0001      Ambulation/Gait   Gait Comments walk with increased knee extension and upright posture     Self-Care   Self-Care Other Self-Care Comments   Other Self-Care Comments  instructed patient on how to use a roller to massage her hamstring     Knee/Hip Exercises: Stretches   Active Hamstring Stretch Right;Left;3 reps;20 seconds   Active Hamstring Stretch Limitations sitting   Gastroc Stretch Right;Left;3 reps;20 seconds     Knee/Hip Exercises: Standing   Lateral Step Up 1 set;15 reps;Hand Hold: 2   Forward Step Up Right;1 set;10 reps;Step Height: 2"     Manual Therapy   Manual Therapy Soft tissue mobilization   Soft  tissue mobilization right hamstring and upper right calf.                  PT Education - 08/08/17 1541    Education provided Yes   Education Details using a rolling pin to massage her hamstring   Person(s) Educated Patient   Methods Explanation;Demonstration;Verbal cues;Handout   Comprehension Returned demonstration;Verbalized understanding          PT Short Term Goals - 07/31/17 0920      PT SHORT TERM GOAL #1   Title Independent with flexibility exercises   Time 4   Period Weeks   Status Achieved     PT SHORT TERM GOAL #2   Title pain with sitting and driving decreased >/= 25% due to decreased trigger points in right hamstring   Time 4   Period Weeks   Status Achieved     PT SHORT TERM GOAL #3   Title pain with stairs decreased >/= 25% due to right hip extension strength has increased to 3+/5   Time 4   Period Weeks   Status Achieved           PT Long Term Goals - 08/06/17 1610       PT LONG TERM GOAL #1   Title independent with HEP and how to progress herself   Baseline still learning   Time 8   Period Weeks   Status On-going     PT LONG TERM GOAL #2   Title pain with driving and sitting decreased >/= 75% due to improve right and left hamstring mobility and strength   Baseline Rt side 60% improved, Lt hip is limiting   Time 8   Period Weeks   Status On-going     PT LONG TERM GOAL #3   Title pain with power walking decreased >/= 75% after 30 minutes due to increased endurance of right and Lt hamstring   Baseline not beend doing this   Time 8   Period Weeks   Status On-going     PT LONG TERM GOAL #4   Title FOTO score is </= 37% limitation   Period Weeks   Status On-going     PT LONG TERM GOAL #5   Title return to gentle weight training with lower body with understanding of safe progression   Baseline just starting   Time 8   Period Weeks   Status On-going     PT LONG TERM GOAL #6   Title sit with neutral seated posture without limitation   Time 8   Period Weeks   Status On-going               Plan - 08/08/17 1543    Clinical Impression Statement Patient has been able to walk for 30 minutes and she was fatiqued afterwards.  Patient walks with increased bilateral hip and knee flexion but was able to walk straighter after being corrected.  Patient is able to do 6 inch step ups without difficulty.  Patient has a stitch coming out on the upper part of the scar.  Patient will benefit from skilled therapy to reduce pain and improve normal activit level.    Rehab Potential Excellent   Clinical Impairments Affecting Rehab Potential myofascial pain syndrom, hypothyroidism, s/p limpoma removed on 07/09/2017   PT Frequency 2x / week   PT Duration 8 weeks   PT Treatment/Interventions Cryotherapy;Electrical Stimulation;Moist Heat;Ultrasound;Patient/family education;Neuromuscular re-education;Therapeutic exercise;Therapeutic activities;Manual  techniques;Dry needling;Taping;Energy conservation   PT Next Visit  Plan go over exercises to do at the gym; soft tissue work, lunges, bridge with one leg, elliptical   PT Home Exercise Plan progress as needed   Consulted and Agree with Plan of Care Patient      Patient will benefit from skilled therapeutic intervention in order to improve the following deficits and impairments:  Difficulty walking, Increased fascial restricitons, Decreased endurance, Decreased activity tolerance, Decreased strength, Impaired flexibility, Pain, Increased muscle spasms  Visit Diagnosis: Muscle weakness (generalized)  Cramp and spasm  Pain of right thigh     Problem List Patient Active Problem List   Diagnosis Date Noted  . Tear of right hamstring 05/30/2017  . Abnormal leg finding 07/07/2015  . Myofascial pain dysfunction syndrome 11/21/2012  . Piriformis syndrome of right side 06/20/2012  . Nonallopathic lesion of lumbosacral region 06/20/2012  . Plantar fasciitis 04/17/2011  . Acquired hallux rigidus 04/17/2011    Earlie Counts, PT 08/08/17 3:47 PM   [facility] [address], [place], [place], [address] Phone: [phone]   Fax:  [phone]  Name: Kristin Andersen MRN: [id number] Date of Birth: 1955/12/11

## 2017-08-13 ENCOUNTER — Encounter: Payer: Self-pay | Admitting: Physical Therapy

## 2017-08-13 ENCOUNTER — Ambulatory Visit: Payer: 59 | Admitting: Physical Therapy

## 2017-08-13 DIAGNOSIS — R252 Cramp and spasm: Secondary | ICD-10-CM

## 2017-08-13 DIAGNOSIS — M79651 Pain in right thigh: Secondary | ICD-10-CM

## 2017-08-13 DIAGNOSIS — M6281 Muscle weakness (generalized): Secondary | ICD-10-CM

## 2017-08-13 NOTE — Therapy (Signed)
[facility] [address] [place], [place], [address] Phone: [phone]   Fax:  [phone]  Physical Therapy Treatment  Patient Details  Name: Kristin Andersen MRN: [id number] Date of Birth: [birth year] Referring Provider: Olivia Mackie A. Shuford PA-C  Encounter Date: 08/13/2017      PT End of Session - 08/13/17 0932    Visit Number 6   Number of Visits 21   Date for PT Re-Evaluation 09/12/17   Authorization Type patient has used 7 visits and has 13 left   Authorization - Visit Number 6   Authorization - Number of Visits 13   PT Start Time 0932   PT Stop Time 2993   PT Time Calculation (min) 42 min   Activity Tolerance Patient tolerated treatment well   Behavior During Therapy WFL for tasks assessed/performed      Past Medical History:  Diagnosis Date  . Hypothyroidism   . IC (interstitial cystitis)   . Myofascial pain dysfunction syndrome    Right side  . Osteoporosis   . Premature menopause   . Stomach ulcer   . Thyroid disease     Past Surgical History:  Procedure Laterality Date  . CERVICAL CONE BIOPSY    . CERVIX LESION DESTRUCTION    . CESAREAN SECTION    . CHOLECYSTECTOMY    . DILATION AND CURETTAGE OF UTERUS    . ROOT CANAL  11/13   3 on 2 teeth  . ROOT CANAL  04/16/14 and 04/27/14   x2  . TONSILLECTOMY      There were no vitals filed for this visit.      Subjective Assessment - 08/13/17 0935    Subjective I am feeling better.  I feel like another stitch is coming out. I am very fatiqued. I was able to do downward dog without difficulty.    Limitations Sitting   How long can you sit comfortably? painful with driving   How long can you stand comfortably? no pain   How long can you walk comfortably? walk with a limp, stairs with step over step pattern   Patient Stated Goals have right leg working and go back to exercise and yoga   Currently in Pain? Yes   Pain Score 1    Pain Location Leg    Pain Orientation Right;Posterior   Pain Descriptors / Indicators Aching   Pain Type Acute pain;Surgical pain   Pain Onset 1 to 4 weeks ago   Pain Frequency Intermittent   Aggravating Factors  sitting and activities throuhout the day   Pain Relieving Factors elevated right leg   Multiple Pain Sites No                         [facility] Adult PT Treatment/Exercise - 08/13/17 0001      Knee/Hip Exercises: Stretches   Active Hamstring Stretch Right;Left;3 reps;20 seconds   Active Hamstring Stretch Limitations sitting   Other Knee/Hip Stretches dead lift l5x without weight; 10x 2# each hand     Knee/Hip Exercises: Aerobic   Stationary Bike lvl 1 x 7 min; pillow behind patient back     Knee/Hip Exercises: Machines for Strengthening   Cybex Leg Press bil. 50# seat #2 15x2     Knee/Hip Exercises: Standing   Knee Flexion Strengthening;Right;1 set;15 reps   Knee Flexion Limitations 2#   Lateral Step Up 1 set;15 reps;Hand Hold: 2   Forward Step Up Right;1 set;Step  Height: 2";15 reps     Knee/Hip Exercises: Supine   Bridges Limitations feet on red physioball knees straight 10x   Other Supine Knee/Hip Exercises supine with feet on red physioball with  feet on ball and bring heels to hips 20x     Manual Therapy   Manual Therapy Soft tissue mobilization   Soft tissue mobilization right hamstring and scar massage                  PT Short Term Goals - 07/31/17 0920      PT SHORT TERM GOAL #1   Title Independent with flexibility exercises   Time 4   Period Weeks   Status Achieved     PT SHORT TERM GOAL #2   Title pain with sitting and driving decreased >/= 25% due to decreased trigger points in right hamstring   Time 4   Period Weeks   Status Achieved     PT SHORT TERM GOAL #3   Title pain with stairs decreased >/= 25% due to right hip extension strength has increased to 3+/5   Time 4   Period Weeks   Status Achieved           PT Long Term Goals  - 08/13/17 1106      PT LONG TERM GOAL #1   Title independent with HEP and how to progress herself   Baseline still learning   Time 8   Status On-going     PT LONG TERM GOAL #2   Title pain with driving and sitting decreased >/= 75% due to improve right and left hamstring mobility and strength   Baseline Rt side 60% improved   Time 8   Period Weeks   Status On-going     PT LONG TERM GOAL #3   Title pain with power walking decreased >/= 75% after 30 minutes due to increased endurance of right and Lt hamstring   Baseline walking but not power walking   Time 8   Period Weeks   Status On-going     PT LONG TERM GOAL #5   Title return to gentle weight training with lower body with understanding of safe progression   Baseline just starting   Time 8   Period Weeks   Status On-going     PT LONG TERM GOAL #6   Title sit with neutral seated posture without limitation   Time 8   Period Weeks   Status On-going               Plan - 08/13/17 1014    Clinical Impression Statement Patient is able to do downward dog wihtout difficulty. Patient is able to do more challenging hamstring exercises. Patient has tightness in the scar and 3 stitches feel they are coming out.  Patient will benefit from skilled therapy to reduce pain and improve normal activity level.    Rehab Potential Excellent   Clinical Impairments Affecting Rehab Potential myofascial pain syndrom, hypothyroidism, s/p limpoma removed on 07/09/2017   PT Frequency 2x / week   PT Duration 8 weeks   PT Treatment/Interventions Cryotherapy;Electrical Stimulation;Moist Heat;Ultrasound;Patient/family education;Neuromuscular re-education;Therapeutic exercise;Therapeutic activities;Manual techniques;Dry needling;Taping;Energy conservation   PT Next Visit Plan go over exercises to do at the gym; soft tissue work, lunges, bridge with two leg, elliptical   PT Home Exercise Plan progress as needed   Consulted and Agree with Plan of  Care Patient      Patient will benefit from skilled therapeutic intervention  in order to improve the following deficits and impairments:  Difficulty walking, Increased fascial restricitons, Decreased endurance, Decreased activity tolerance, Decreased strength, Impaired flexibility, Pain, Increased muscle spasms  Visit Diagnosis: Muscle weakness (generalized)  Cramp and spasm  Pain of right thigh     Problem List Patient Active Problem List   Diagnosis Date Noted  . Tear of right hamstring 05/30/2017  . Abnormal leg finding 07/07/2015  . Myofascial pain dysfunction syndrome 11/21/2012  . Piriformis syndrome of right side 06/20/2012  . Nonallopathic lesion of lumbosacral region 06/20/2012  . Plantar fasciitis 04/17/2011  . Acquired hallux rigidus 04/17/2011    Earlie Counts, PT 08/13/17 11:07 AM   [facility] [address], [place], [place], [address] Phone: [phone]   Fax:  [phone]  Name: KATERINA ZURN MRN: [id number] Date of Birth: Mar 20, 1956

## 2017-08-19 ENCOUNTER — Encounter: Payer: Self-pay | Admitting: Physical Therapy

## 2017-08-19 ENCOUNTER — Ambulatory Visit: Payer: 59 | Attending: Orthopedic Surgery | Admitting: Physical Therapy

## 2017-08-19 DIAGNOSIS — M6281 Muscle weakness (generalized): Secondary | ICD-10-CM | POA: Diagnosis not present

## 2017-08-19 DIAGNOSIS — R252 Cramp and spasm: Secondary | ICD-10-CM | POA: Diagnosis present

## 2017-08-19 DIAGNOSIS — M79651 Pain in right thigh: Secondary | ICD-10-CM | POA: Diagnosis present

## 2017-08-19 NOTE — Therapy (Signed)
[facility] [address] [place], [place], [address] Phone: [phone]   Fax:  [phone]  Physical Therapy Treatment  Patient Details  Name: Kristin Andersen MRN: [id number] Date of Birth: 09/11/1956 Referring Provider: Olivia Mackie A. Shuford PA-C  Encounter Date: 08/19/2017      PT End of Session - 08/19/17 0928    Visit Number 7   Number of Visits 21   Date for PT Re-Evaluation 09/12/17   Authorization Type patient has used 7 visits and has 13 left   Authorization - Visit Number 7   Authorization - Number of Visits 13   PT Start Time 0845   PT Stop Time 0928   PT Time Calculation (min) 43 min   Activity Tolerance Patient tolerated treatment well   Behavior During Therapy WFL for tasks assessed/performed      Past Medical History:  Diagnosis Date  . Hypothyroidism   . IC (interstitial cystitis)   . Myofascial pain dysfunction syndrome    Right side  . Osteoporosis   . Premature menopause   . Stomach ulcer   . Thyroid disease     Past Surgical History:  Procedure Laterality Date  . CERVICAL CONE BIOPSY    . CERVIX LESION DESTRUCTION    . CESAREAN SECTION    . CHOLECYSTECTOMY    . DILATION AND CURETTAGE OF UTERUS    . ROOT CANAL  11/13   3 on 2 teeth  . ROOT CANAL  04/16/14 and 04/27/14   x2  . TONSILLECTOMY      There were no vitals filed for this visit.      Subjective Assessment - 08/19/17 0855    Subjective I am catching a cold.    Limitations Sitting   How long can you sit comfortably? painful with driving   How long can you stand comfortably? no pain   How long can you walk comfortably? walk with a limp, stairs with step over step pattern   Patient Stated Goals have right leg working and go back to exercise and yoga   Currently in Pain? Yes   Pain Score 1    Pain Location Leg   Pain Orientation Right;Posterior   Pain Descriptors / Indicators Sore   Pain Type Acute pain   Pain Onset 1  to 4 weeks ago   Pain Frequency Intermittent   Aggravating Factors  sitting and activities throughout the day   Pain Relieving Factors elevated right leg   Multiple Pain Sites No                         [facility] Adult PT Treatment/Exercise - 08/19/17 0001      Knee/Hip Exercises: Stretches   Active Hamstring Stretch Right;Left;3 reps;20 seconds   Active Hamstring Stretch Limitations sitting   Gastroc Stretch Right;Left;3 reps;20 seconds   Other Knee/Hip Stretches dead lift l5x without weight; 10x 2# each hand     Knee/Hip Exercises: Aerobic   Stationary Bike level 1 x 7 min; pillow behind patient back     Knee/Hip Exercises: Machines for Strengthening   Cybex Leg Press bil. 55# seat #2 15x2     Knee/Hip Exercises: Standing   Knee Flexion Strengthening;Right;1 set;15 reps   Knee Flexion Limitations 2#     Manual Therapy   Manual Therapy Soft tissue mobilization   Soft tissue mobilization right hamstring and scar massage  PT Short Term Goals - 07/31/17 0920      PT SHORT TERM GOAL #1   Title Independent with flexibility exercises   Time 4   Period Weeks   Status Achieved     PT SHORT TERM GOAL #2   Title pain with sitting and driving decreased >/= 25% due to decreased trigger points in right hamstring   Time 4   Period Weeks   Status Achieved     PT SHORT TERM GOAL #3   Title pain with stairs decreased >/= 25% due to right hip extension strength has increased to 3+/5   Time 4   Period Weeks   Status Achieved           PT Long Term Goals - 08/19/17 2536      PT LONG TERM GOAL #1   Title independent with HEP and how to progress herself   Baseline still learning   Time 8   Period Weeks   Status On-going     PT LONG TERM GOAL #2   Title pain with driving and sitting decreased >/= 75% due to improve right and left hamstring mobility and strength   Baseline Rt side 70% improved   Time 8   Period Weeks   Status  On-going     PT LONG TERM GOAL #3   Title pain with power walking decreased >/= 75% after 30 minutes due to increased endurance of right and Lt hamstring   Baseline 60% better   Period Weeks   Status On-going     PT LONG TERM GOAL #4   Title FOTO score is </= 37% limitation   Time 8   Period Weeks   Status On-going     PT LONG TERM GOAL #5   Title return to gentle weight training with lower body with understanding of safe progression   Baseline just starting   Time 8   Period Weeks   Status On-going     PT LONG TERM GOAL #6   Title sit with neutral seated posture without limitation   Time 8   Period Weeks   Status On-going               Plan - 08/19/17 0930    Clinical Impression Statement Patient is 60% better with walking.  Patient is able to sit but the right thigh is sore with pressure.  Patient has improve mobility of scar.  Patient is able to walk with increased right hip and knee extension.  Patient will benefit from skilled therapy to reduce pain and improve normal activity level.    Rehab Potential Excellent   Clinical Impairments Affecting Rehab Potential myofascial pain syndrom, hypothyroidism, s/p limpoma removed on 07/09/2017   PT Frequency 2x / week   PT Duration 8 weeks   PT Treatment/Interventions Cryotherapy;Electrical Stimulation;Moist Heat;Ultrasound;Patient/family education;Neuromuscular re-education;Therapeutic exercise;Therapeutic activities;Manual techniques;Dry needling;Taping;Energy conservation   PT Next Visit Plan ; soft tissue work, lunges, bridge with two leg, elliptical   PT Home Exercise Plan progress as needed   Consulted and Agree with Plan of Care Patient      Patient will benefit from skilled therapeutic intervention in order to improve the following deficits and impairments:  Difficulty walking, Increased fascial restricitons, Decreased endurance, Decreased activity tolerance, Decreased strength, Impaired flexibility, Pain, Increased  muscle spasms  Visit Diagnosis: Muscle weakness (generalized)  Cramp and spasm  Pain of right thigh     Problem List Patient Active Problem List   Diagnosis  Date Noted  . Tear of right hamstring 05/30/2017  . Abnormal leg finding 07/07/2015  . Myofascial pain dysfunction syndrome 11/21/2012  . Piriformis syndrome of right side 06/20/2012  . Nonallopathic lesion of lumbosacral region 06/20/2012  . Plantar fasciitis 04/17/2011  . Acquired hallux rigidus 04/17/2011    Earlie Counts, PT 08/19/17 9:33 AM   [facility] [address] [place], [place], [address] Phone: [phone]   Fax:  [phone]  Name: SHALAUNDA WEATHERHOLTZ MRN: [id number] Date of Birth: 1956-05-15

## 2017-08-26 ENCOUNTER — Encounter: Payer: Self-pay | Admitting: Physical Therapy

## 2017-08-26 ENCOUNTER — Ambulatory Visit: Payer: 59 | Admitting: Physical Therapy

## 2017-08-26 DIAGNOSIS — M79651 Pain in right thigh: Secondary | ICD-10-CM

## 2017-08-26 DIAGNOSIS — M6281 Muscle weakness (generalized): Secondary | ICD-10-CM | POA: Diagnosis not present

## 2017-08-26 DIAGNOSIS — R252 Cramp and spasm: Secondary | ICD-10-CM

## 2017-08-26 NOTE — Therapy (Signed)
[facility] [address] [place], [place], [address] Phone: [phone]   Fax:  [phone]  Physical Therapy Treatment  Patient Details  Name: Kristin Andersen MRN: [id number] Date of Birth: 07/13/1956 Referring Provider: Olivia Mackie A. Shuford PA-C  Encounter Date: 08/26/2017      PT End of Session - 08/26/17 0855    Visit Number 8   Number of Visits 21   Date for PT Re-Evaluation 09/12/17   Authorization Type patient has used 7 visits and has 13 left   Authorization - Visit Number 8   Authorization - Number of Visits 13   PT Start Time [phone]   PT Stop Time 0925   PT Time Calculation (min) 39 min   Activity Tolerance Patient tolerated treatment well   Behavior During Therapy WFL for tasks assessed/performed      Past Medical History:  Diagnosis Date  . Hypothyroidism   . IC (interstitial cystitis)   . Myofascial pain dysfunction syndrome    Right side  . Osteoporosis   . Premature menopause   . Stomach ulcer   . Thyroid disease     Past Surgical History:  Procedure Laterality Date  . CERVICAL CONE BIOPSY    . CERVIX LESION DESTRUCTION    . CESAREAN SECTION    . CHOLECYSTECTOMY    . DILATION AND CURETTAGE OF UTERUS    . ROOT CANAL  11/13   3 on 2 teeth  . ROOT CANAL  04/16/14 and 04/27/14   x2  . TONSILLECTOMY      There were no vitals filed for this visit.      Subjective Assessment - 08/26/17 0851    Subjective I was able to do a 2 mile hike with very little difficulty.    Limitations Sitting   How long can you sit comfortably? painful with driving   How long can you stand comfortably? no pain   How long can you walk comfortably? walk with a limp, stairs with step over step pattern   Patient Stated Goals have right leg working and go back to exercise and yoga   Currently in Pain? No/denies                         [facility] Adult PT Treatment/Exercise - 08/26/17 0001      Exercises   Exercises Other Exercises   Other Exercises  quadruped hamstring curl 10x     Knee/Hip Exercises: Machines for Strengthening   Cybex Leg Press bil. 55# seat #2 15x2     Knee/Hip Exercises: Standing   Knee Flexion Strengthening;Right;1 set;15 reps   Knee Flexion Limitations 3#   Lateral Step Up 1 set;Right;15 reps     Knee/Hip Exercises: Supine   Single Leg Bridge Strengthening;Right;1 set;5 reps     Manual Therapy   Manual Therapy Soft tissue mobilization   Soft tissue mobilization right hamstring and scar massage                  PT Short Term Goals - 07/31/17 0920      PT SHORT TERM GOAL #1   Title Independent with flexibility exercises   Time 4   Period Weeks   Status Achieved     PT SHORT TERM GOAL #2   Title pain with sitting and driving decreased >/= 25% due to decreased trigger points in right hamstring   Time 4   Period Weeks  Status Achieved     PT SHORT TERM GOAL #3   Title pain with stairs decreased >/= 25% due to right hip extension strength has increased to 3+/5   Time 4   Period Weeks   Status Achieved           PT Long Term Goals - 08/26/17 9935      PT LONG TERM GOAL #1   Title independent with HEP and how to progress herself   Baseline still learning   Time 8   Period Weeks   Status On-going     PT LONG TERM GOAL #2   Title pain with driving and sitting decreased >/= 75% due to improve right and left hamstring mobility and strength   Baseline Rt side 70% improved   Time 8   Period Weeks   Status On-going     PT LONG TERM GOAL #3   Title pain with power walking decreased >/= 75% after 30 minutes due to increased endurance of right and Lt hamstring   Baseline 60% better   Time 8   Period Weeks   Status On-going     PT LONG TERM GOAL #4   Title FOTO score is </= 37% limitation   Time 8   Period Weeks   Status On-going     PT LONG TERM GOAL #5   Title return to gentle weight training with lower body with  understanding of safe progression   Baseline just starting   Time 8   Period Weeks   Status On-going     PT LONG TERM GOAL #6   Title sit with neutral seated posture without limitation   Time 8   Period Weeks   Status On-going               Plan - 08/26/17 0926    Clinical Impression Statement Patient was able to hike this weekend due to increased strength.  Patient reports less discomfort with sitting.  Patient is starting single leg bridge but is difficulty.  Patient has improved mobility of scar.  The upper portion of the scar still has a stitch that has not come out yet. Patient is walking with increased hip and knee extension.  Patient will benefit from skilled therapy to reduce pain and improve normal activity level.    Rehab Potential Excellent   Clinical Impairments Affecting Rehab Potential myofascial pain syndrom, hypothyroidism, s/p limpoma removed on 07/09/2017   PT Frequency 2x / week   PT Duration 8 weeks   PT Treatment/Interventions Cryotherapy;Electrical Stimulation;Moist Heat;Ultrasound;Patient/family education;Neuromuscular re-education;Therapeutic exercise;Therapeutic activities;Manual techniques;Dry needling;Taping;Energy conservation   PT Next Visit Plan ; soft tissue work, lunges, bridge with two leg, elliptical   PT Home Exercise Plan progress as needed   Consulted and Agree with Plan of Care Patient      Patient will benefit from skilled therapeutic intervention in order to improve the following deficits and impairments:  Difficulty walking, Increased fascial restricitons, Decreased endurance, Decreased activity tolerance, Decreased strength, Impaired flexibility, Pain, Increased muscle spasms  Visit Diagnosis: Muscle weakness (generalized)  Cramp and spasm  Pain of right thigh     Problem List Patient Active Problem List   Diagnosis Date Noted  . Tear of right hamstring 05/30/2017  . Abnormal leg finding 07/07/2015  . Myofascial pain  dysfunction syndrome 11/21/2012  . Piriformis syndrome of right side 06/20/2012  . Nonallopathic lesion of lumbosacral region 06/20/2012  . Plantar fasciitis 04/17/2011  . Acquired hallux rigidus 04/17/2011  Earlie Counts, PT 08/26/17 9:31 AM   [facility] [address], [place], [place], [address] Phone: [phone]   Fax:  [phone]  Name: Kristin Andersen MRN: [id number] Date of Birth: Apr 08, 1956

## 2017-09-02 ENCOUNTER — Encounter: Payer: Self-pay | Admitting: Physical Therapy

## 2017-09-02 ENCOUNTER — Ambulatory Visit: Payer: 59 | Admitting: Physical Therapy

## 2017-09-02 DIAGNOSIS — M79651 Pain in right thigh: Secondary | ICD-10-CM

## 2017-09-02 DIAGNOSIS — R252 Cramp and spasm: Secondary | ICD-10-CM

## 2017-09-02 DIAGNOSIS — M6281 Muscle weakness (generalized): Secondary | ICD-10-CM | POA: Diagnosis not present

## 2017-09-02 NOTE — Therapy (Signed)
[facility] [address] [place], [place], [address] Phone: [phone]   Fax:  [phone]  Physical Therapy Treatment  Patient Details  Name: ERMINIE FOULKS MRN: [id number] Date of Birth: 1956/05/01 Referring Provider: Olivia Mackie A. Shuford PA-C  Encounter Date: 09/02/2017      PT End of Session - 09/02/17 0857    Visit Number 9   Number of Visits 21   Date for PT Re-Evaluation 09/12/17   Authorization Type patient has used 7 visits and has 13 left   Authorization - Visit Number 9   Authorization - Number of Visits 13   PT Start Time [phone]   PT Stop Time 0930   PT Time Calculation (min) 44 min   Activity Tolerance Patient tolerated treatment well   Behavior During Therapy WFL for tasks assessed/performed      Past Medical History:  Diagnosis Date  . Hypothyroidism   . IC (interstitial cystitis)   . Myofascial pain dysfunction syndrome    Right side  . Osteoporosis   . Premature menopause   . Stomach ulcer   . Thyroid disease     Past Surgical History:  Procedure Laterality Date  . CERVICAL CONE BIOPSY    . CERVIX LESION DESTRUCTION    . CESAREAN SECTION    . CHOLECYSTECTOMY    . DILATION AND CURETTAGE OF UTERUS    . ROOT CANAL  11/13   3 on 2 teeth  . ROOT CANAL  04/16/14 and 04/27/14   x2  . TONSILLECTOMY      There were no vitals filed for this visit.      Subjective Assessment - 09/02/17 0856    Subjective I am feeling good.  The scar is feeling tight. I still have a stitch in the scar. I ran and did well.    How long can you sit comfortably? painful with driving   How long can you stand comfortably? no pain   How long can you walk comfortably? walk with a limp, stairs with step over step pattern   Patient Stated Goals have right leg working and go back to exercise and yoga   Currently in Pain? No/denies                         [facility] Adult PT Treatment/Exercise -  09/02/17 0001      Knee/Hip Exercises: Aerobic   Stationary Bike level 1 x 5 min; pillow behind patient back   Elliptical 1 min level 31min going backwards     Knee/Hip Exercises: Machines for Strengthening   Cybex Leg Press bil. 55# seat #2 15x2     Knee/Hip Exercises: Standing   Knee Flexion Strengthening;Right;1 set;15 reps   Knee Flexion Limitations 3#   Lateral Step Up 1 set;Right;15 reps     Manual Therapy   Manual Therapy Soft tissue mobilization   Soft tissue mobilization right hamstring and scar massage                  PT Short Term Goals - 07/31/17 0920      PT SHORT TERM GOAL #1   Title Independent with flexibility exercises   Time 4   Period Weeks   Status Achieved     PT SHORT TERM GOAL #2   Title pain with sitting and driving decreased >/= 25% due to decreased trigger points in right hamstring   Time 4   Period Weeks  Status Achieved     PT SHORT TERM GOAL #3   Title pain with stairs decreased >/= 25% due to right hip extension strength has increased to 3+/5   Time 4   Period Weeks   Status Achieved           PT Long Term Goals - 09/02/17 4166      PT LONG TERM GOAL #1   Title independent with HEP and how to progress herself   Baseline still learning   Time 8   Period Weeks   Status On-going     PT LONG TERM GOAL #2   Title pain with driving and sitting decreased >/= 75% due to improve right and left hamstring mobility and strength   Baseline Rt side 70% improved   Time 8   Period Weeks   Status On-going     PT LONG TERM GOAL #3   Title pain with power walking decreased >/= 75% after 30 minutes due to increased endurance of right and Lt hamstring   Baseline 60% better   Time 8   Period Weeks   Status On-going     PT LONG TERM GOAL #4   Title FOTO score is </= 37% limitation   Time 8   Period Weeks   Status On-going     PT LONG TERM GOAL #5   Title return to gentle weight training with lower body with understanding of  safe progression   Baseline just starting   Period Weeks   Status On-going     PT LONG TERM GOAL #6   Title sit with neutral seated posture without limitation   Time 8   Period Weeks   Status On-going               Plan - 09/02/17 0630    Clinical Impression Statement Patient has tightness in her scar.  Patient is able to do the elliptical backwards for 3min due to increased strength. Patient still has one stitch left in her insicision.  Patient will be seeing the doctor this week.  Patient will benefit from skilled therapy to reduce pain and improve normanl activity level.    Rehab Potential Excellent   Clinical Impairments Affecting Rehab Potential myofascial pain syndrom, hypothyroidism, s/p limpoma removed on 07/09/2017   PT Frequency 2x / week   PT Duration 8 weeks   PT Treatment/Interventions Cryotherapy;Electrical Stimulation;Moist Heat;Ultrasound;Patient/family education;Neuromuscular re-education;Therapeutic exercise;Therapeutic activities;Manual techniques;Dry needling;Taping;Energy conservation   PT Next Visit Plan Discharge next visit; see how MD visit went   PT Home Exercise Plan progress as needed   Consulted and Agree with Plan of Care Patient      Patient will benefit from skilled therapeutic intervention in order to improve the following deficits and impairments:  Difficulty walking, Increased fascial restricitons, Decreased endurance, Decreased activity tolerance, Decreased strength, Impaired flexibility, Pain, Increased muscle spasms  Visit Diagnosis: Muscle weakness (generalized)  Cramp and spasm  Pain of right thigh     Problem List Patient Active Problem List   Diagnosis Date Noted  . Tear of right hamstring 05/30/2017  . Abnormal leg finding 07/07/2015  . Myofascial pain dysfunction syndrome 11/21/2012  . Piriformis syndrome of right side 06/20/2012  . Nonallopathic lesion of lumbosacral region 06/20/2012  . Plantar fasciitis 04/17/2011  .  Acquired hallux rigidus 04/17/2011    Earlie Counts, PT 09/02/17 9:31 AM   [facility] [address], [place], [place], [address] Phone: [phone]  Fax:  [phone]  Name: WENDE LONGSTRETH MRN: [id number] Date of Birth: 04-14-1956

## 2017-09-09 ENCOUNTER — Ambulatory Visit: Payer: 59 | Admitting: Physical Therapy

## 2017-09-09 ENCOUNTER — Encounter: Payer: Self-pay | Admitting: Physical Therapy

## 2017-09-09 DIAGNOSIS — M79651 Pain in right thigh: Secondary | ICD-10-CM

## 2017-09-09 DIAGNOSIS — M6281 Muscle weakness (generalized): Secondary | ICD-10-CM

## 2017-09-09 DIAGNOSIS — R252 Cramp and spasm: Secondary | ICD-10-CM

## 2017-09-09 NOTE — Patient Instructions (Addendum)
Chair Sitting    Sit at edge of seat, spine straight, one leg extended. Put a hand on each thigh and bend forward from the hip, keeping spine straight. Allow hand on extended leg to reach toward toes. Support upper body with other arm. Hold _30__ seconds. Repeat __2  times per session. Do _1__ sessions per day.  Copyright  VHI. All rights reserved.  Knee Flexion: Resisted (Standing)    With support, __2__ pound weight around right ankle, slowly bend knee up. Return slowly.  Repeat _15___ times per set. Do _2__ sets per session. Do __1__ sessions per day.  http://orth.exer.us/740   Copyright  VHI. All rights reserved.  Strengthening: Hip Extension (Prone)    Tighten muscles on front of left thigh, then lift leg __3__ inches from surface, keeping knee locked. Repeat __15__ times per set. Do __1__ sets per session. Do _1___ sessions per day.  Then do the exercise with the right knee bent 15 times 1 time per day.  http://orth.exer.us/620   Copyright  VHI. All rights reserved.  Strengthening: Hip Abduction (Side-Lying)    Tighten muscles on front of left thigh, then lift leg _12__ inches from surface, keeping knee locked.  Repeat _15___ times per set. Do __1__ sets per session. Do __1__ sessions per day. Substitute with sitting hip in and out.  http://orth.exer.us/622   Copyright  VHI. All rights reserved.  Knee Flexion: Hamstring Drop (Eccentric) - Seated (Weight Machine)    Sit on machine. Pull feet back until knees are at 90. Slowly release legs for 3-5 seconds. __10_ reps per set, _1__ sets per day, __1 days per week. Add _5__ lbs when you achieve _30__ repetitions.  http://ecce.exer.us/109   Copyright  VHI. All rights reserved.  HIP: Abduction - Standing (Multi-Hip Machine)    Place pad against upper leg. Move leg out to side. Keep back straight. Hold _2__ seconds. Use __10_ lbs. _10__ reps per set, _1__ sets per day, 1___ days per week  Copyright  VHI.  All rights reserved.  HIP: Adduction - Standing (Multi-Hip Machine)    Place upper thigh over pad. Move leg across body. Keep back straight. Hold _2__ seconds. Use _10__ lbs. _10__ reps per set, _1__ sets per day, _1__ days per week  Copyright  VHI. All rights reserved.  Hip / Glute Flexion: Standing (Machine)    Leg over pad behind, bring knee forward and up as high as possible. Do __1__ sets. Complete _10___ repetitions.  http://st.exer.us/349   Copyright  VHI. All rights reserved.  HIP: Extension - Standing (Multi-Hip Machine)    Place upper thigh over pad. Tighten glutes; move leg backward, keeping back straight. Hold __1_ seconds. Use 10___ lbs. __10_ reps per set, _1_ sets per day, __1_ days per week  Copyright  VHI. All rights reserved.  [facility] [address], [place], [place] [address] Phone # [phone] Fax [phone]

## 2017-09-09 NOTE — Therapy (Addendum)
[facility] [address], [place], [place], [address] Phone: [phone]   Fax:  [phone]  Physical Therapy Treatment  Patient Details  Name: Kristin Andersen MRN: [id number] Date of Birth: 11-Oct-1956 Referring Provider: Olivia Mackie A. Shuford PA-C  Encounter Date: 09/09/2017      PT End of Session - 09/09/17 0926    Visit Number 10   Date for PT Re-Evaluation 09/12/17   Authorization Type patient has used 7 visits and has 13 left   Authorization - Visit Number 10   Authorization - Number of Visits 13   PT Start Time 0845   PT Stop Time 0927   PT Time Calculation (min) 42 min   Activity Tolerance Patient tolerated treatment well   Behavior During Therapy WFL for tasks assessed/performed      Past Medical History:  Diagnosis Date  . Hypothyroidism   . IC (interstitial cystitis)   . Myofascial pain dysfunction syndrome    Right side  . Osteoporosis   . Premature menopause   . Stomach ulcer   . Thyroid disease     Past Surgical History:  Procedure Laterality Date  . CERVICAL CONE BIOPSY    . CERVIX LESION DESTRUCTION    . CESAREAN SECTION    . CHOLECYSTECTOMY    . DILATION AND CURETTAGE OF UTERUS    . ROOT CANAL  11/13   3 on 2 teeth  . ROOT CANAL  04/16/14 and 04/27/14   x2  . TONSILLECTOMY      There were no vitals filed for this visit.      Subjective Assessment - 09/09/17 0859    Subjective I saw the doctor.  I am healing now from the stitch. MD is happy with her progress. I walked 3 miles with some tightness of the scar tissue.    Limitations Sitting   How long can you sit comfortably? painful with driving   How long can you stand comfortably? no pain   How long can you walk comfortably? walk with a limp, stairs with step over step pattern   Patient Stated Goals have right leg working and go back to exercise and yoga   Currently in Pain? No/denies            [facility] PT Assessment -  09/09/17 0001      Assessment   Medical Diagnosis lipoma of right thigh s/p excision   Referring Provider Olivia Mackie A. Shuford PA-C   Onset Date/Surgical Date 07/09/17   Prior Therapy yes     Precautions   Precautions Other (comment)   Precaution Comments osteoporosis     Restrictions   Weight Bearing Restrictions No     Balance Screen   Has the patient fallen in the past 6 months No   Has the patient had a decrease in activity level because of a fear of falling?  No   Is the patient reluctant to leave their home because of a fear of falling?  No     Prior Function   Level of Independence Independent     Cognition   Overall Cognitive Status Within Functional Limits for tasks assessed     Posture/Postural Control   Posture/Postural Control No significant limitations     Strength   Right Hip Flexion 4+/5   Right Hip Extension 4+/5   Right Hip External Rotation  4+/5   Right Hip Internal Rotation 4+/5   Right Hip ABduction 4/5   Right Hip  ADduction 5/5   Right Knee Flexion 4+/5   Right Knee Extension 5/5                     [facility] Adult PT Treatment/Exercise - 09/09/17 0001      Self-Care   Self-Care Other Self-Care Comments   Other Self-Care Comments  how to use tennis ball to massage scar and hamstring in sitting with rocking side to side     Manual Therapy   Manual Therapy Soft tissue mobilization   Soft tissue mobilization right hamstring and scar massage                PT Education - 09/09/17 0926    Education provided Yes   Education Details hip and knee strengthening in the gym, using a tennis ball to massage the hamstring   Person(s) Educated Patient   Methods Explanation;Demonstration;Verbal cues;Handout   Comprehension Returned demonstration;Verbalized understanding          PT Short Term Goals - 07/31/17 0920      PT SHORT TERM GOAL #1   Title Independent with flexibility exercises   Time 4   Period Weeks   Status Achieved      PT SHORT TERM GOAL #2   Title pain with sitting and driving decreased >/= 25% due to decreased trigger points in right hamstring   Time 4   Period Weeks   Status Achieved     PT SHORT TERM GOAL #3   Title pain with stairs decreased >/= 25% due to right hip extension strength has increased to 3+/5   Time 4   Period Weeks   Status Achieved           PT Long Term Goals - 09/09/17 1937      PT LONG TERM GOAL #1   Title independent with HEP and how to progress herself   Time 8   Period Weeks   Status Achieved     PT LONG TERM GOAL #2   Title pain with driving and sitting decreased >/= 75% due to improve right and left hamstring mobility and strength   Time 8   Period Weeks   Status Achieved     PT LONG TERM GOAL #3   Title pain with power walking decreased >/= 75% after 30 minutes due to increased endurance of right and Lt hamstring   Time 8   Period Weeks   Status Achieved     PT LONG TERM GOAL #4   Title FOTO score is </= 37% limitation   Time 8   Period Weeks   Status Achieved     PT LONG TERM GOAL #5   Title return to gentle weight training with lower body with understanding of safe progression   Time 8   Period Weeks   Status Achieved     PT LONG TERM GOAL #6   Title sit with neutral seated posture without limitation   Time 8   Period Weeks   Status Achieved               Plan - 09/09/17 9024    Clinical Impression Statement Patient has met her goals. Patient has increased mobility of her hamstring and scar.  Patient is ready to work out at [employer]. Patient has increased strength in right hip and knee. Patient is ready for discharge.    Rehab Potential Excellent   Clinical Impairments Affecting Rehab Potential myofascial pain syndrom, hypothyroidism, s/p limpoma  removed on 07/09/2017   PT Treatment/Interventions Cryotherapy;Electrical Stimulation;Moist Heat;Ultrasound;Patient/family education;Neuromuscular re-education;Therapeutic  exercise;Therapeutic activities;Manual techniques;Dry needling;Taping;Energy conservation   PT Next Visit Plan Discharge to HEP this visit   PT Home Exercise Plan Current HEP   Consulted and Agree with Plan of Care Patient      Patient will benefit from skilled therapeutic intervention in order to improve the following deficits and impairments:  Difficulty walking, Increased fascial restricitons, Decreased endurance, Decreased activity tolerance, Decreased strength, Impaired flexibility, Pain, Increased muscle spasms  Visit Diagnosis: Muscle weakness (generalized)  Cramp and spasm  Pain of right thigh     Problem List Patient Active Problem List   Diagnosis Date Noted  . Tear of right hamstring 05/30/2017  . Abnormal leg finding 07/07/2015  . Myofascial pain dysfunction syndrome 11/21/2012  . Piriformis syndrome of right side 06/20/2012  . Nonallopathic lesion of lumbosacral region 06/20/2012  . Plantar fasciitis 04/17/2011  . Acquired hallux rigidus 04/17/2011    Earlie Counts, PT 09/09/17 9:30 AM    [facility] [address], [place], [place], [address] Phone: [phone]   Fax:  [phone]  Name: Kristin Andersen MRN: [id number] Date of Birth: 01-13-1956  PHYSICAL THERAPY DISCHARGE SUMMARY  Visits from Start of Care: 10  Current functional level related to goals / functional outcomes: See above.    Remaining deficits: See above.    Education / Equipment: HEP Plan: Patient agrees to discharge.  Patient goals were met. Patient is being discharged due to meeting the stated rehab goals.  Thank you for the referral. Earlie Counts, PT 09/12/17 5:17 PM  ?????

## 2017-09-16 ENCOUNTER — Encounter: Payer: 59 | Admitting: Physical Therapy

## 2017-11-07 ENCOUNTER — Other Ambulatory Visit: Payer: Self-pay | Admitting: Obstetrics & Gynecology

## 2017-11-07 NOTE — Telephone Encounter (Signed)
Medication refill request: Ambien  Last AEX:  09-18-16  Next AEX: 01-10-18  Last MMG (if hormonal medication request): 02-07-17 WNL  Refill authorized: please advise

## 2017-12-06 ENCOUNTER — Other Ambulatory Visit: Payer: Self-pay | Admitting: Obstetrics & Gynecology

## 2017-12-06 NOTE — Telephone Encounter (Signed)
Medication refill request: Estradiol Last AEX:  09/18/16 SM Next AEX: 01/10/18 Last MMG (if hormonal medication request): 02/08/17 BIRADS 1 negative/density d Refill authorized: 09/18/16 #42.5g w/3 refills; today please advise

## 2018-01-10 ENCOUNTER — Ambulatory Visit (INDEPENDENT_AMBULATORY_CARE_PROVIDER_SITE_OTHER): Payer: PRIVATE HEALTH INSURANCE | Admitting: Obstetrics & Gynecology

## 2018-01-10 ENCOUNTER — Encounter: Payer: Self-pay | Admitting: Obstetrics & Gynecology

## 2018-01-10 VITALS — BP 110/60 | HR 80 | Resp 14 | Ht 58.75 in | Wt 100.0 lb

## 2018-01-10 DIAGNOSIS — Z01419 Encounter for gynecological examination (general) (routine) without abnormal findings: Secondary | ICD-10-CM | POA: Diagnosis not present

## 2018-01-10 MED ORDER — ZOLPIDEM TARTRATE 10 MG PO TABS: 10.0000 mg | ORAL_TABLET | Freq: Every evening | ORAL | 1 refills | Status: DC | PRN

## 2018-01-10 MED ORDER — ESTRADIOL 0.1 MG/GM VA CREA: TOPICAL_CREAM | VAGINAL | 3 refills | Status: DC

## 2018-01-10 NOTE — Progress Notes (Signed)
62 y.o. G2P2 MarriedCaucasianF here for annual exam.  Having some palpitations that she thinks is related to anxiety.  Did wear a heart monitor for three weeks.  Has tried Zoloft and Wellbutrin.  Having unexplained fatigue.  Has had lengthy discussions with Dr. Brigitte Pulse.  Is planning on starting Prestiq.  Doesn't want to start another medication right now.   Had lipoma removed in August in the back of her knee.  Did need PT for several months after the 9cm lipoma was removed.  Doing much better now.  Does not want to start anything for osteoporosis.  BMD is due next year.  Just feels she has too much medically going on with anxiety and more recent knee issues.  Denies vaginal bleeding.    Patient's last menstrual period was 11/19/1989.          Sexually active: Yes.    The current method of family planning is post menopausal status.    Exercising: Yes.    walking Smoker:  no  Health Maintenance: Pap:  09/18/16 Neg. HR HPV:neg   05/24/14 Neg  History of abnormal Pap:  Yes, h/o cone bx MMG:  02/08/17 BIRADS1:Neg  Colonoscopy: 2017 Normal. F/u 5 years  BMD:   02/08/17 Osteoporosis  TDaP:  2014 Pneumonia vaccine(s):  No Shingrix:   No Hep C testing: No Screening Labs: PCP   reports that  has never smoked. she has never used smokeless tobacco. She reports that she drinks about 4.2 - 6.0 oz of alcohol per week. She reports that she does not use drugs.  Past Medical History:  Diagnosis Date  . Hypothyroidism   . IC (interstitial cystitis)   . Myofascial pain dysfunction syndrome    Right side  . Osteoporosis   . Premature menopause   . Stomach ulcer   . Thyroid disease     Past Surgical History:  Procedure Laterality Date  . CERVICAL CONE BIOPSY    . CERVIX LESION DESTRUCTION    . CESAREAN SECTION    . CHOLECYSTECTOMY    . DILATION AND CURETTAGE OF UTERUS    . LIPOMA EXCISION Right 07/09/2017   Leg   . ROOT CANAL  11/13   3 on 2 teeth  . ROOT CANAL  04/16/14 and 04/27/14   x2  .  TONSILLECTOMY      Current Outpatient Medications  Medication Sig Dispense Refill  . azelastine (ASTELIN) 0.1 % nasal spray daily as needed.  5  . Calcium Carbonate-Vit D-Min (CALCIUM 1200 PO) Take by mouth daily.    . cetirizine (ZYRTEC) 10 MG tablet Take 10 mg by mouth daily.    . cholecalciferol (VITAMIN D) 1000 units tablet Take by mouth daily.    Marland Kitchen EPIPEN 2-PAK 0.3 MG/0.3ML SOAJ injection INJECT AS DIRECTED AS NEEDED  1  . estradiol (ESTRACE) 0.1 MG/GM vaginal cream USE 1 GRAM VAGINALLY 2 TIMES A WEEK 42.5 g 0  . famotidine (PEPCID) 20 MG tablet Take by mouth daily.    . fluticasone (FLONASE) 50 MCG/ACT nasal spray Place into both nostrils daily.    Marland Kitchen levothyroxine (SYNTHROID) 50 MCG tablet Take 1 tablet by mouth daily.    Marland Kitchen MAGNESIUM PO Take by mouth daily.    . Meth-Hyo-M Bl-Na Phos-Ph Sal (URIBEL) 118 MG CAPS TK 1 C PO QID PRF BURNING  11  . Probiotic Product (ALIGN) 4 MG CAPS Take 1 capsule by mouth daily.    Marland Kitchen venlafaxine XR (EFFEXOR-XR) 37.5 MG 24 hr capsule Take 1  capsule by mouth daily.  0  . XIIDRA 5 % SOLN     . zolpidem (AMBIEN) 10 MG tablet TAKE 1 TABLET BY MOUTH AT BEDTIME AS NEEDED FOR SLEEP 30 tablet 0   No current facility-administered medications for this visit.     Family History  Problem Relation Age of Onset  . Alzheimer's disease Mother   . Liver cancer Father   . Other Brother        Good Health  . Cancer Other     ROS:  Pertinent items are noted in HPI.  Otherwise, a comprehensive ROS was negative.  Exam:   BP 110/60 (BP Location: Right Arm, Patient Position: Sitting, Cuff Size: Normal)   Pulse 80   Resp 14   Ht 4' 10.75" (1.492 m)   Wt 100 lb (45.4 kg)   LMP 11/19/1989   BMI 20.37 kg/m    Height: 4' 10.75" (149.2 cm)  Ht Readings from Last 3 Encounters:  01/10/18 4' 10.75" (1.492 m)  09/18/16 4' 10.75" (1.492 m)  05/15/16 4' 11.5" (1.511 m)    General appearance: alert, cooperative and appears stated age Head: Normocephalic, without  obvious abnormality, atraumatic Neck: no adenopathy, supple, symmetrical, trachea midline and thyroid normal to inspection and palpation Lungs: clear to auscultation bilaterally Breasts: normal appearance, no masses or tenderness Heart: regular rate and rhythm Abdomen: soft, non-tender; bowel sounds normal; no masses,  no organomegaly Extremities: extremities normal, atraumatic, no cyanosis or edema Skin: Skin color, texture, turgor normal. No rashes or lesions Lymph nodes: Cervical, supraclavicular, and axillary nodes normal. No abnormal inguinal nodes palpated Neurologic: Grossly normal   Pelvic: External genitalia:  no lesions              Urethra:  normal appearing urethra with no masses, tenderness or lesions              Bartholins and Skenes: normal                 Vagina: normal appearing vagina with normal color and discharge, no lesions              Cervix: no lesions              Pap taken: No. Bimanual Exam:  Uterus:  normal size, contour, position, consistency, mobility, non-tender              Adnexa: normal adnexa and no mass, fullness, tenderness               Rectovaginal: Confirms               Anus:  normal sphincter tone, no lesions  Chaperone was present for exam.  A:  Well Woman with normal exam PMP, no HRT Anxiety, currently starting treatment H/O chronic insomnia H/O Osteoporosis H/o BCC, followed by dermatologist  P:   Mammogram guidelines reviewed.  Doing 3D. pap smear negative with neg HR HPV 10/17.  No pap smear indicated today Ambiem 10mg , 1/2 tab nigthly prn.  #30/5RF. Estrace vaginal cream 1 grm pv twice weekly.  #30/2RF. Repeat BMD around 3/20. return annually or prn

## 2018-01-10 NOTE — Patient Instructions (Signed)
Dr. Dorris Carnes, cardiology

## 2018-05-12 ENCOUNTER — Encounter: Payer: Self-pay | Admitting: Obstetrics & Gynecology

## 2018-12-06 ENCOUNTER — Telehealth: Payer: Self-pay | Admitting: Obstetrics & Gynecology

## 2018-12-06 ENCOUNTER — Encounter: Payer: Self-pay | Admitting: Obstetrics & Gynecology

## 2018-12-06 NOTE — Telephone Encounter (Signed)
Opened in error

## 2018-12-10 NOTE — Telephone Encounter (Signed)
BMD order faxed to [facility] 12/09/18

## 2018-12-18 ENCOUNTER — Encounter: Payer: Self-pay | Admitting: Physical Therapy

## 2018-12-18 ENCOUNTER — Ambulatory Visit: Payer: 59 | Attending: Urology | Admitting: Physical Therapy

## 2018-12-18 DIAGNOSIS — M6281 Muscle weakness (generalized): Secondary | ICD-10-CM | POA: Diagnosis present

## 2018-12-18 DIAGNOSIS — R252 Cramp and spasm: Secondary | ICD-10-CM | POA: Diagnosis present

## 2018-12-18 NOTE — Patient Instructions (Signed)
Piriformis Stretch, Sitting    Sit, one ankle on opposite knee, same-side hand on crossed knee. Push down on knee, keeping spine straight. Lean torso forward, with flat back, until tension is felt in hamstrings and gluteals of crossed-leg side. Hold __30_ seconds.  Repeat _2__ times per session. Do __1_ sessions per day.  Copyright  VHI. All rights reserved.  Trigger Point Dry Needling  . What is Trigger Point Dry Needling (DN)? o DN is a physical therapy technique used to treat muscle pain and dysfunction. Specifically, DN helps deactivate muscle trigger points (muscle knots).  o A thin filiform needle is used to penetrate the skin and stimulate the underlying trigger point. The goal is for a local twitch response (LTR) to occur and for the trigger point to relax. No medication of any kind is injected during the procedure.   . What Does Trigger Point Dry Needling Feel Like?  o The procedure feels different for each individual patient. Some patients report that they do not actually feel the needle enter the skin and overall the process is not painful. Very mild bleeding may occur. However, many patients feel a deep cramping in the muscle in which the needle was inserted. This is the local twitch response.   Marland Kitchen How Will I feel after the treatment? o Soreness is normal, and the onset of soreness may not occur for a few hours. Typically this soreness does not last longer than two days.  o Bruising is uncommon, however; ice can be used to decrease any possible bruising.  o In rare cases feeling tired or nauseous after the treatment is normal. In addition, your symptoms may get worse before they get better, this period will typically not last longer than 24 hours.   . What Can I do After My Treatment? o Increase your hydration by drinking more water for the next 24 hours. o You may place ice or heat on the areas treated that have become sore, however, do not use heat on inflamed or bruised areas.  Heat often brings more relief post needling. o You can continue your regular activities, but vigorous activity is not recommended initially after the treatment for 24 hours. o DN is best combined with other physical therapy such as strengthening, stretching, and other therapies.    [facility] [address], [place], [place] [address] Phone # [phone] Fax [phone]

## 2018-12-18 NOTE — Therapy (Signed)
[facility] [address], [place], [place], [address] Phone: [phone]   Fax:  [phone]  Physical Therapy Evaluation  Patient Details  Name: Kristin Andersen MRN: [id number] Date of Birth: [birth year] Referring Provider (PT): Dr. Irine Seal   Encounter Date: 12/18/2018  PT End of Session - 12/18/18 1033    Visit Number  1    Date for PT Re-Evaluation  02/12/19    Authorization Type  [employer]    PT Start Time  0930    PT Stop Time  1026    PT Time Calculation (min)  56 min    Activity Tolerance  Patient tolerated treatment well    Behavior During Therapy  [facility] for tasks assessed/performed       Past Medical History:  Diagnosis Date  . Heart palpitations    Due to anxiety   . Hypothyroidism   . IC (interstitial cystitis)   . Myofascial pain dysfunction syndrome    Right side  . Osteoporosis   . Premature menopause   . Stomach ulcer   . Thyroid disease     Past Surgical History:  Procedure Laterality Date  . CERVICAL CONE BIOPSY    . CERVIX LESION DESTRUCTION    . CESAREAN SECTION    . CHOLECYSTECTOMY    . DILATION AND CURETTAGE OF UTERUS    . LIPOMA EXCISION Right 07/09/2017   Leg   . ROOT CANAL  11/13   3 on 2 teeth  . ROOT CANAL  04/16/14 and 04/27/14   x2  . TONSILLECTOMY      There were no vitals filed for this visit.   Subjective Assessment - 12/18/18 0940    Subjective  Patient started exercising 1 month ago by going back to the gym with treadmill, bike and weight machines. I am having pain in the lower left quadrant.     Patient Stated Goals  get rid of pain,     Currently in Pain?  Yes    Pain Score  3    high is 7/10   Pain Location  Pelvis    Pain Orientation  Mid    Pain Descriptors / Indicators  Aching;Burning   pulsating   Pain Type  Acute pain    Pain Onset  More than a month ago    Pain Frequency  Intermittent    Aggravating Factors   nothing    Pain Relieving Factors   nothing    Multiple Pain Sites  Yes    Pain Score  3   high 7/10   Pain Location  Abdomen    Pain Orientation  Left    Pain Descriptors / Indicators  Aching    Pain Type  Acute pain    Pain Radiating Towards  travels to back    Pain Onset  More than a month ago    Pain Frequency  Intermittent    Aggravating Factors   bend over; after working out    Pain Relieving Factors  heat         [facility] PT Assessment - 12/18/18 0001      Assessment   Medical Diagnosis  Interstitial Cystitis without hematuria    Referring Provider (PT)  Dr. Irine Seal    Onset Date/Surgical Date  12/18/18    Prior Therapy  yes      Precautions   Precautions  None    Precaution Comments  osteoporosis      Restrictions  Weight Bearing Restrictions  No      Balance Screen   Has the patient fallen in the past 6 months  No    Has the patient had a decrease in activity level because of a fear of falling?   No    Is the patient reluctant to leave their home because of a fear of falling?   No      Home Film/video editor residence      Prior Function   Level of Independence  Independent    Leisure  gym,       Cognition   Overall Cognitive Status  Within Functional Limits for tasks assessed      Posture/Postural Control   Posture/Postural Control  No significant limitations      ROM / Strength   AROM / PROM / Strength  AROM;PROM;Strength      Strength   Left Hip Extension  4/5      Palpation   SI assessment   left ilum is rotated posteriorly, sacrum rotated to the right    Palpation comment  left iliopsoas, suprapubically, left piriformis, tightness in the pelvic floor                Objective measurements completed on examination: See above findings.    Pelvic Floor Special Questions - 12/18/18 0001    Prior Pregnancies  Yes    Number of Pregnancies  2    Number of C-Sections  2    Currently Sexually Active  Yes    Is this Painful  Yes    Marinoff  Scale  discomfort that does not affect completion    Urinary Leakage  No    Fecal incontinence  No    Skin Integrity  Intact    Prolapse  None    Pelvic Floor Internal Exam  Patient confirms identification and approves PT to assess pelvic floor and treatment    Exam Type  Vaginal    Palpation  tenderness located on the perineal body, obturator internist, left levator ani    Strength  good squeeze, good lift, able to hold agaisnt strong resistance    Tone  increased tone       [facility] Adult PT Treatment/Exercise - 12/18/18 0001      Lumbar Exercises: Stretches   Piriformis Stretch  Right;Left;1 rep;30 seconds   sitting     Manual Therapy   Manual Therapy  Soft tissue mobilization;Internal Pelvic Floor    Soft tissue mobilization  left iliopsoas, left piriformis, left gluteal    Internal Pelvic Floor  perineal body and left side of introitus and obturator internist       Trigger Point Dry Needling - 12/18/18 1036    Consent Given?  Yes    Education Handout Provided  Yes    Muscles Treated Upper Body  Iliopsoas    Muscles Treated Lower Body  Piriformis;Gluteus maximus    Gluteus Maximus Response  Twitch response elicited;Palpable increased muscle length    Piriformis Response  Twitch response elicited;Palpable increased muscle length           PT Education - 12/18/18 1032    Education Details  piriformis stretch; information on dry needling    Person(s) Educated  Patient    Methods  Explanation;Demonstration;Verbal cues;Handout    Comprehension  Returned demonstration;Verbalized understanding       PT Short Term Goals - 12/18/18 1048      PT SHORT TERM GOAL #  1   Title  Independent with flexibility exercises    Time  4    Period  Weeks    Status  New    Target Date  01/15/19      PT SHORT TERM GOAL #2   Title  pain with daily tasks decreased >/= 25% due to reduction of trigger points    Time  4    Period  Weeks    Status  New    Target Date  01/15/19      PT  SHORT TERM GOAL #3   Title  understand how to perform self perineal soft tissue work using a wand to reduce trigger points    Time  4    Period  Weeks    Status  New    Target Date  01/15/19        PT Long Term Goals - 12/18/18 1050      PT LONG TERM GOAL #1   Title  independent with HEP and how to progress herself    Baseline  --    Time  8    Period  Weeks    Status  New    Target Date  02/12/19      PT LONG TERM GOAL #2   Title  pain with working out in the gym decreased >/= 80% with understanding how to perform correctly due to osteoporosis    Baseline  --    Time  8    Period  Weeks    Status  New    Target Date  02/12/19      PT LONG TERM GOAL #3   Title  urethral pain decreased >/= 80% due to improved tissue mobility and reduction of trigger points    Baseline  --    Time  8    Period  Weeks    Status  New    Target Date  02/12/19      PT LONG TERM GOAL #4   Title  pelvis in correct alignment and understands ways to perform muscle energy technique    Time  8    Period  Weeks    Status  New    Target Date  02/12/19      PT LONG TERM GOAL #5   Title  -------------    Baseline  -----------------             Plan - 12/18/18 1040    Clinical Impression Statement  Patient is a 63 year old female with a flare-up of interstitial Cystitis for the past month. Patient reports her urethral and left lower quadrant pain is 3-7/10 intermittently.  Patient reports her pain came on suddenly. Patient has started working out and the pain in the left lower quadrant pain started. Left ilium is rotated posteriorly and sacrum rotated left. Patient has tenderness located on the left piriformins, left gluteal, left iliopsoas, right hip adductor, left levator ani and bilateral obturator internist. Internal palpation causes pain level 7/10 on the left pelvic floor muscles. Patient introitus is tight but after gentle soft tissue work will relax. Patient will benefit from skilled  therapy to reduce pain, release fascial restrictions and improve function.     History and Personal Factors relevant to plan of care:  osteoporosis with T-score -3.1    Clinical Presentation  Evolving    Clinical Presentation due to:  affects her from doing daily activities due to pain    Clinical Decision Making  Low    Rehab Potential  Excellent    Clinical Impairments Affecting Rehab Potential  osteoporosis with T-score -3.1    PT Frequency  2x / week    PT Duration  8 weeks    PT Treatment/Interventions  Biofeedback;Cryotherapy;Electrical Stimulation;Moist Heat;Iontophoresis 4mg /ml Dexamethasone;Ultrasound;Therapeutic activities;Therapeutic exercise;Neuromuscular re-education;Manual techniques;Patient/family education;Dry needling    PT Next Visit Plan  correct pelvis; assess the dry needling, dry needling to right hip adductors, hip stretces, bridges, pelvic floor soft tissue work and piriformis; educate on the intimate Rose wand for internal soft tissue work    Oncologist with Plan of Care  Patient       Patient will benefit from skilled therapeutic intervention in order to improve the following deficits and impairments:  Increased fascial restricitons, Pain, Decreased coordination, Increased muscle spasms, Impaired tone, Decreased activity tolerance, Decreased strength  Visit Diagnosis: Cramp and spasm  Muscle weakness (generalized)     Problem List Patient Active Problem List   Diagnosis Date Noted  . Tear of right hamstring 05/30/2017  . Abnormal leg finding 07/07/2015  . Myofascial pain dysfunction syndrome 11/21/2012  . Piriformis syndrome of right side 06/20/2012  . Nonallopathic lesion of lumbosacral region 06/20/2012  . Plantar fasciitis 04/17/2011  . Acquired hallux rigidus 04/17/2011    Earlie Counts, PT 12/18/18 10:54 AM   [facility] [address] [place], [place], [address] Phone:  [phone]   Fax:  [phone]  Name: Kristin Andersen MRN: [id number] Date of Birth: 30-Apr-1956

## 2018-12-22 ENCOUNTER — Ambulatory Visit: Payer: 59 | Attending: Urology | Admitting: Physical Therapy

## 2018-12-22 ENCOUNTER — Encounter: Payer: Self-pay | Admitting: Physical Therapy

## 2018-12-22 DIAGNOSIS — M6281 Muscle weakness (generalized): Secondary | ICD-10-CM | POA: Diagnosis present

## 2018-12-22 DIAGNOSIS — R252 Cramp and spasm: Secondary | ICD-10-CM

## 2018-12-22 NOTE — Therapy (Signed)
[facility] [address] [place], [place], [address] Phone: [phone]   Fax:  [phone]  Physical Therapy Treatment  Patient Details  Name: Kristin Andersen MRN: [id number] Date of Birth: [birth year] Referring Provider (PT): Dr. Irine Seal   Encounter Date: 12/22/2018  PT End of Session - 12/22/18 1315    Visit Number  2    Date for PT Re-Evaluation  02/12/19    Authorization Type  [employer]    PT Start Time  1230    PT Stop Time  1310    PT Time Calculation (min)  40 min    Activity Tolerance  Patient tolerated treatment well    Behavior During Therapy  [facility] for tasks assessed/performed       Past Medical History:  Diagnosis Date  . Heart palpitations    Due to anxiety   . Hypothyroidism   . IC (interstitial cystitis)   . Myofascial pain dysfunction syndrome    Right side  . Osteoporosis   . Premature menopause   . Stomach ulcer   . Thyroid disease     Past Surgical History:  Procedure Laterality Date  . CERVICAL CONE BIOPSY    . CERVIX LESION DESTRUCTION    . CESAREAN SECTION    . CHOLECYSTECTOMY    . DILATION AND CURETTAGE OF UTERUS    . LIPOMA EXCISION Right 07/09/2017   Leg   . ROOT CANAL  11/13   3 on 2 teeth  . ROOT CANAL  04/16/14 and 04/27/14   x2  . TONSILLECTOMY      There were no vitals filed for this visit.  Subjective Assessment - 12/22/18 1232    Subjective   Pelvic is 25% better with burining. I was sore after the dry needling for that day and I got better. My bladder was better on Saturday but it felt worse yesterday. My low back has been sore.     Patient Stated Goals  get rid of pain,     Currently in Pain?  Yes    Pain Score  3     Pain Location  Pelvis    Pain Orientation  Mid    Pain Descriptors / Indicators  Aching;Burning    Pain Type  Acute pain    Pain Onset  More than a month ago    Pain Frequency  Intermittent    Aggravating Factors   sitting    Pain Relieving  Factors  standing    Multiple Pain Sites  Yes    Pain Score  5    Pain Location  Abdomen    Pain Orientation  Left    Pain Descriptors / Indicators  Aching    Pain Type  Acute pain    Pain Radiating Towards  travels to back at level 7/10 with aching    Pain Onset  More than a month ago    Pain Frequency  Intermittent    Aggravating Factors   bend over; after working out    Pain Relieving Factors  heat                    Pelvic Floor Special Questions - 12/22/18 0001    Pelvic Floor Internal Exam  Patient confirms identification and approves PT to assess pelvic floor and treatment    Exam Type  Vaginal        [facility] Adult PT Treatment/Exercise - 12/22/18 0001  Self-Care   Self-Care  Other Self-Care Comments    Other Self-Care Comments   educated patient on pelvic floor wand for soft tissue work      Modalities   Modalities  Moist Heat      Moist Heat Therapy   Number Minutes Moist Heat  15 Minutes    Moist Heat Location  Lumbar Spine      Manual Therapy   Manual Therapy  Soft tissue mobilization;Internal Pelvic Floor    Soft tissue mobilization  bil. lumbar paraspinals and gluteals    Internal Pelvic Floor  urethra sphincter, left piriformis, left obturator internist, left levator internist, left side of introitus with left hip movement       Trigger Point Dry Needling - 12/22/18 1243    Consent Given?  Yes    Muscles Treated Upper Body  --   bil. T12-L5 multifidi   Muscles Treated Lower Body  Gluteus minimus;Gluteus maximus    Gluteus Maximus Response  Twitch response elicited;Palpable increased muscle length    Gluteus Minimus Response  Twitch response elicited;Palpable increased muscle length           PT Education - 12/22/18 1315    Education Details  information on intimate rose wand for pelvic floor massage    Person(s) Educated  Patient    Methods  Explanation;Handout    Comprehension  Verbalized understanding       PT Short Term  Goals - 12/18/18 1048      PT SHORT TERM GOAL #1   Title  Independent with flexibility exercises    Time  4    Period  Weeks    Status  New    Target Date  01/15/19      PT SHORT TERM GOAL #2   Title  pain with daily tasks decreased >/= 25% due to reduction of trigger points    Time  4    Period  Weeks    Status  New    Target Date  01/15/19      PT SHORT TERM GOAL #3   Title  understand how to perform self perineal soft tissue work using a wand to reduce trigger points    Time  4    Period  Weeks    Status  New    Target Date  01/15/19        PT Long Term Goals - 12/18/18 1050      PT LONG TERM GOAL #1   Title  independent with HEP and how to progress herself    Baseline  --    Time  8    Period  Weeks    Status  New    Target Date  02/12/19      PT LONG TERM GOAL #2   Title  pain with working out in the gym decreased >/= 80% with understanding how to perform correctly due to osteoporosis    Baseline  --    Time  8    Period  Weeks    Status  New    Target Date  02/12/19      PT LONG TERM GOAL #3   Title  urethral pain decreased >/= 80% due to improved tissue mobility and reduction of trigger points    Baseline  --    Time  8    Period  Weeks    Status  New    Target Date  02/12/19      PT LONG  TERM GOAL #4   Title  pelvis in correct alignment and understands ways to perform muscle energy technique    Time  8    Period  Weeks    Status  New    Target Date  02/12/19      PT LONG TERM GOAL #5   Title  -------------    Baseline  -----------------            Plan - 12/22/18 1236    Clinical Impression Statement  Patient had many trigger points in the lumbar multifidi and gluteus medius. Patient bladder pain decreased by 25%. Patient will be purchasing a pelvic floor massager wand to work on trigger points in the pelvic floor at home and use to manage her pain. Patient has not met goals due to just starting therapy. Patient will benefit from skilled  therapy to reduce pain, release fascial restrictions and improve function.     Rehab Potential  Excellent    Clinical Impairments Affecting Rehab Potential  osteoporosis with T-score -3.1    PT Frequency  2x / week    PT Duration  8 weeks    PT Treatment/Interventions  Biofeedback;Cryotherapy;Electrical Stimulation;Moist Heat;Iontophoresis '4mg'$ /ml Dexamethasone;Ultrasound;Therapeutic activities;Therapeutic exercise;Neuromuscular re-education;Manual techniques;Patient/family education;Dry needling    PT Next Visit Plan  correct pelvis, dry needling to right hip adductors, hip stretches, bridges, pelvic floor soft tissue work and piriformis;     Recommended Other Services  MD signed initial eval    Consulted and Agree with Plan of Care  Patient       Patient will benefit from skilled therapeutic intervention in order to improve the following deficits and impairments:  Increased fascial restricitons, Pain, Decreased coordination, Increased muscle spasms, Impaired tone, Decreased activity tolerance, Decreased strength  Visit Diagnosis: Cramp and spasm  Muscle weakness (generalized)     Problem List Patient Active Problem List   Diagnosis Date Noted  . Tear of right hamstring 05/30/2017  . Abnormal leg finding 07/07/2015  . Myofascial pain dysfunction syndrome 11/21/2012  . Piriformis syndrome of right side 06/20/2012  . Nonallopathic lesion of lumbosacral region 06/20/2012  . Plantar fasciitis 04/17/2011  . Acquired hallux rigidus 04/17/2011    Earlie Counts, PT 12/22/18 1:20 PM   [facility] [address], [place], [place], [address] Phone: [phone]   Fax:  [phone]  Name: Kristin Andersen MRN: [id number] Date of Birth: 1956/02/23

## 2018-12-22 NOTE — Patient Instructions (Addendum)
 .  intimaterose.[facility] [address], [place], [place] [address] Phone # [phone] Fax [phone]

## 2018-12-24 ENCOUNTER — Encounter: Payer: Self-pay | Admitting: Physical Therapy

## 2018-12-24 ENCOUNTER — Ambulatory Visit: Payer: 59 | Admitting: Physical Therapy

## 2018-12-24 DIAGNOSIS — R252 Cramp and spasm: Secondary | ICD-10-CM | POA: Diagnosis not present

## 2018-12-24 DIAGNOSIS — M6281 Muscle weakness (generalized): Secondary | ICD-10-CM

## 2018-12-24 NOTE — Patient Instructions (Signed)
Access Code: [email or web address]  URL: [email or web address]  Date: 12/24/2018  Prepared by: Earlie Counts   Exercises  Cat-Camel - 10 reps - 1 sets - 1x daily - 7x weekly  Supine Butterfly Groin Stretch - 1 reps - 1 sets - 1 min hold - 1x daily - 7x weekly  Child's Pose Stretch - 2 reps - 1 sets - 30 sec hold - 1x daily - 7x weekly  Ardine Eng Pose Side Bend - 2 reps - 1 sets - 30 sec hold - 1x daily - 7x weekly  Hooklying Active Hamstring Stretch - 2 reps - 1 sets - 30 sec hold - 1x daily - 7x weekly  [facility] [address] [place], [place] [address] Phone # [phone] Fax [phone]

## 2018-12-24 NOTE — Therapy (Signed)
[facility] [address] [place], [place], [address] Phone: [phone]   Fax:  [phone]  Physical Therapy Treatment  Patient Details  Name: Kristin Andersen MRN: [id number] Date of Birth: May 23, 1956 Referring Provider (PT): Dr. Irine Seal   Encounter Date: 12/24/2018  PT End of Session - 12/24/18 1423    Visit Number  3    Date for PT Re-Evaluation  02/12/19    Authorization Type  [employer]    Authorization - Visit Number  3    Authorization - Number of Visits  21    PT Start Time  1751    PT Stop Time  0258    PT Time Calculation (min)  45 min    Activity Tolerance  Patient tolerated treatment well;No increased pain    Behavior During Therapy  WFL for tasks assessed/performed       Past Medical History:  Diagnosis Date  . Heart palpitations    Due to anxiety   . Hypothyroidism   . IC (interstitial cystitis)   . Myofascial pain dysfunction syndrome    Right side  . Osteoporosis   . Premature menopause   . Stomach ulcer   . Thyroid disease     Past Surgical History:  Procedure Laterality Date  . CERVICAL CONE BIOPSY    . CERVIX LESION DESTRUCTION    . CESAREAN SECTION    . CHOLECYSTECTOMY    . DILATION AND CURETTAGE OF UTERUS    . LIPOMA EXCISION Right 07/09/2017   Leg   . ROOT CANAL  11/13   3 on 2 teeth  . ROOT CANAL  04/16/14 and 04/27/14   x2  . TONSILLECTOMY      There were no vitals filed for this visit.  Subjective Assessment - 12/24/18 1234    Subjective  I am not feeling any different since the evaluation. I did the cat cow to relax my back. The urethra is still painful. I plugged in my TENs unit.     Patient Stated Goals  get rid of pain,     Currently in Pain?  Yes    Pain Score  3     Pain Location  Pelvis    Pain Orientation  Mid    Pain Descriptors / Indicators  Aching;Burning    Pain Type  Acute pain    Pain Onset  More than a month ago    Pain Frequency  Intermittent    Aggravating Factors   sitting    Pain Relieving Factors  standing    Multiple Pain Sites  No    Pain Score  5    Pain Location  Abdomen    Pain Orientation  Left    Pain Descriptors / Indicators  Aching    Pain Type  Acute pain    Pain Onset  More than a month ago    Pain Frequency  Intermittent    Aggravating Factors   bned over; after working out    Pain Relieving Factors  heat                    Pelvic Floor Special Questions - 12/24/18 0001    Pelvic Floor Internal Exam  Patient confirms identification and approves PT to assess pelvic floor and treatment    Exam Type  Vaginal        [facility] Adult PT Treatment/Exercise - 12/24/18 0001      Lumbar Exercises: Stretches  Active Hamstring Stretch  Right;Left;1 rep;30 seconds   supine   Quadruped Mid Back Stretch  3 reps;30 seconds   all three directions   Other Lumbar Stretch Exercise  butterfly stretch 1 min      Lumbar Exercises: Quadruped   Madcat/Old Horse  10 reps      Modalities   Modalities  Moist Heat      Moist Heat Therapy   Number Minutes Moist Heat  15 Minutes    Moist Heat Location  Lumbar Spine      Manual Therapy   Manual Therapy  Myofascial release;Internal Pelvic Floor    Myofascial Release  suprapubic area releaseing tissue externally while perforiming internal soft tissue work    Internal Pelvic Floor  urethra sphincter, left obturator internist, perineal body, bil . sides of the introitus, myofascial release on the sides of the bladder             PT Education - 12/24/18 1428    Education Details  Access Code: [email or web address]     Person(s) Educated  Patient    Methods  Explanation;Demonstration;Verbal cues;Handout    Comprehension  Returned demonstration;Verbalized understanding       PT Short Term Goals - 12/24/18 1428      PT SHORT TERM GOAL #1   Title  Independent with flexibility exercises    Time  4    Period  Weeks    Status  On-going      PT SHORT TERM GOAL #2    Title  pain with daily tasks decreased >/= 25% due to reduction of trigger points    Time  4    Period  Weeks    Status  On-going      PT SHORT TERM GOAL #3   Title  understand how to perform self perineal soft tissue work using a wand to reduce trigger points    Time  4    Period  Weeks    Status  On-going        PT Long Term Goals - 12/18/18 1050      PT LONG TERM GOAL #1   Title  independent with HEP and how to progress herself    Baseline  --    Time  8    Period  Weeks    Status  New    Target Date  02/12/19      PT LONG TERM GOAL #2   Title  pain with working out in the gym decreased >/= 80% with understanding how to perform correctly due to osteoporosis    Baseline  --    Time  8    Period  Weeks    Status  New    Target Date  02/12/19      PT LONG TERM GOAL #3   Title  urethral pain decreased >/= 80% due to improved tissue mobility and reduction of trigger points    Baseline  --    Time  8    Period  Weeks    Status  New    Target Date  02/12/19      PT LONG TERM GOAL #4   Title  pelvis in correct alignment and understands ways to perform muscle energy technique    Time  8    Period  Weeks    Status  New    Target Date  02/12/19      PT LONG TERM GOAL #5   Title  -------------  Baseline  -----------------            Plan - 12/24/18 1240    Clinical Impression Statement  After therapy pain decreased to 3/10. Patient had significant tenderness located on the bladder, introitus, perineal body, and pelvic floor muscles. Patient had restrictions around the bladder and left lower quadrant. Patient will start to use her home TENS unit for pain management. Patient has ordered the pelvic floor wand so she will be educated on using it for pelvic  floor muscle trigger point release. Patient will benefit from skilled therapy to reduce pain, release fascial restrictions and improve function.     Rehab Potential  Excellent    Clinical Impairments Affecting  Rehab Potential  osteoporosis with T-score -3.1    PT Frequency  2x / week    PT Duration  8 weeks    PT Treatment/Interventions  Biofeedback;Cryotherapy;Electrical Stimulation;Moist Heat;Iontophoresis 4mg /ml Dexamethasone;Ultrasound;Therapeutic activities;Therapeutic exercise;Neuromuscular re-education;Manual techniques;Patient/family education;Dry needling    PT Next Visit Plan  correct pelvis, dry needling to right hip adductors, hip stretches, bridges, pelvic floor soft tissue work and piriformis;     PT Home Exercise Plan  Access Code: [email or web address]     Consulted and Agree with Plan of Care  Patient       Patient will benefit from skilled therapeutic intervention in order to improve the following deficits and impairments:  Increased fascial restricitons, Pain, Decreased coordination, Increased muscle spasms, Impaired tone, Decreased activity tolerance, Decreased strength  Visit Diagnosis: Cramp and spasm  Muscle weakness (generalized)     Problem List Patient Active Problem List   Diagnosis Date Noted  . Tear of right hamstring 05/30/2017  . Abnormal leg finding 07/07/2015  . Myofascial pain dysfunction syndrome 11/21/2012  . Piriformis syndrome of right side 06/20/2012  . Nonallopathic lesion of lumbosacral region 06/20/2012  . Plantar fasciitis 04/17/2011  . Acquired hallux rigidus 04/17/2011    Earlie Counts, PT 12/24/18 2:30 PM   [facility] [address] [place], [place], [address] Phone: [phone]   Fax:  [phone]  Name: Kristin Andersen MRN: [id number] Date of Birth: 09/02/1956

## 2018-12-26 ENCOUNTER — Encounter: Payer: 59 | Admitting: Physical Therapy

## 2018-12-30 ENCOUNTER — Encounter: Payer: Self-pay | Admitting: Physical Therapy

## 2018-12-30 ENCOUNTER — Ambulatory Visit: Payer: 59 | Admitting: Physical Therapy

## 2018-12-30 DIAGNOSIS — M6281 Muscle weakness (generalized): Secondary | ICD-10-CM

## 2018-12-30 DIAGNOSIS — R252 Cramp and spasm: Secondary | ICD-10-CM | POA: Diagnosis not present

## 2018-12-30 NOTE — Therapy (Signed)
[facility] [address], [place], [place], [address] Phone: [phone]   Fax:  [phone]  Physical Therapy Treatment  Patient Details  Name: Kristin Andersen MRN: [id number] Date of Birth: Apr 18, 1956 Referring Provider (PT): Dr. Irine Seal   Encounter Date: 12/30/2018  PT End of Session - 12/30/18 1319    Visit Number  4    Date for PT Re-Evaluation  02/12/19    Authorization Type  [employer]    Authorization - Visit Number  4    Authorization - Number of Visits  21    PT Start Time  8366    PT Stop Time  1225    PT Time Calculation (min)  40 min    Activity Tolerance  Patient tolerated treatment well;No increased pain    Behavior During Therapy  WFL for tasks assessed/performed       Past Medical History:  Diagnosis Date  . Heart palpitations    Due to anxiety   . Hypothyroidism   . IC (interstitial cystitis)   . Myofascial pain dysfunction syndrome    Right side  . Osteoporosis   . Premature menopause   . Stomach ulcer   . Thyroid disease     Past Surgical History:  Procedure Laterality Date  . CERVICAL CONE BIOPSY    . CERVIX LESION DESTRUCTION    . CESAREAN SECTION    . CHOLECYSTECTOMY    . DILATION AND CURETTAGE OF UTERUS    . LIPOMA EXCISION Right 07/09/2017   Leg   . ROOT CANAL  11/13   3 on 2 teeth  . ROOT CANAL  04/16/14 and 04/27/14   x2  . TONSILLECTOMY      There were no vitals filed for this visit.  Subjective Assessment - 12/30/18 1153    Subjective  I did better during the trip. Saturday was a good day. Bowel movements are loose.     Patient Stated Goals  get rid of pain,     Currently in Pain?  Yes    Pain Score  2     Pain Location  Pelvis    Pain Orientation  Mid    Pain Descriptors / Indicators  Aching;Burning    Pain Type  Acute pain    Pain Onset  More than a month ago    Pain Frequency  Intermittent    Aggravating Factors   sitting    Pain Relieving Factors  standing     Multiple Pain Sites  No                    Pelvic Floor Special Questions - 12/30/18 0001    Pelvic Floor Internal Exam  Patient confirms identification and approves PT to assess pelvic floor and treatment    Exam Type  Vaginal        [facility] Adult PT Treatment/Exercise - 12/30/18 0001      Moist Heat Therapy   Number Minutes Moist Heat  15 Minutes    Moist Heat Location  Lumbar Spine      Manual Therapy   Manual Therapy  Soft tissue mobilization;Internal Pelvic Floor    Soft tissue mobilization  left iliiopsoas, left obturator internist, quadratus, left lumbar paraspinals,     Internal Pelvic Floor  left side of urethra sphincter, obturator internist, left side of the bladder       Trigger Point Dry Needling - 12/30/18 1200    Consent Given?  Yes    Muscles Treated Upper Body  Quadratus Lumborum;Iliopsoas    Muscles Treated Lower Body  --   twitch response, elonagation of muscle            PT Short Term Goals - 12/30/18 1319      PT SHORT TERM GOAL #1   Title  Independent with flexibility exercises    Time  4    Period  Weeks    Status  Achieved      PT SHORT TERM GOAL #2   Title  pain with daily tasks decreased >/= 25% due to reduction of trigger points    Baseline  this weekend it happened    Time  4    Period  Weeks    Status  On-going      PT SHORT TERM GOAL #3   Title  understand how to perform self perineal soft tissue work using a wand to reduce trigger points    Baseline  will learn next session    Time  4    Period  Weeks    Status  On-going        PT Long Term Goals - 12/18/18 1050      PT LONG TERM GOAL #1   Title  independent with HEP and how to progress herself    Baseline  --    Time  8    Period  Weeks    Status  New    Target Date  02/12/19      PT LONG TERM GOAL #2   Title  pain with working out in the gym decreased >/= 80% with understanding how to perform correctly due to osteoporosis    Baseline  --    Time   8    Period  Weeks    Status  New    Target Date  02/12/19      PT LONG TERM GOAL #3   Title  urethral pain decreased >/= 80% due to improved tissue mobility and reduction of trigger points    Baseline  --    Time  8    Period  Weeks    Status  New    Target Date  02/12/19      PT LONG TERM GOAL #4   Title  pelvis in correct alignment and understands ways to perform muscle energy technique    Time  8    Period  Weeks    Status  New    Target Date  02/12/19      PT LONG TERM GOAL #5   Title  -------------    Baseline  -----------------            Plan - 12/30/18 1157    Clinical Impression Statement  Patient pain level after manual work decreased to 1/10. Patient had trigger point release on the quadratus and iliopsoas on the left. Patient had fascial release on the left side bladder. Patient reports this weekend she was abel to have intercourse without pain. Patient will benefit from skilled therapy to reduce pain, release fascial restrictions and improve function.     Rehab Potential  Excellent    Clinical Impairments Affecting Rehab Potential  osteoporosis with T-score -3.1    PT Frequency  2x / week    PT Duration  8 weeks    PT Treatment/Interventions  Biofeedback;Cryotherapy;Electrical Stimulation;Moist Heat;Iontophoresis 4mg /ml Dexamethasone;Ultrasound;Therapeutic activities;Therapeutic exercise;Neuromuscular re-education;Manual techniques;Patient/family education;Dry needling    PT Next Visit Plan  correct  pelvis, instruction on using the wand of the pelvic floor, hip stretches, bridges, pelvic floor soft tissue work and piriformis;     PT Home Exercise Plan  Access Code: [email or web address]     Consulted and Agree with Plan of Care  Patient       Patient will benefit from skilled therapeutic intervention in order to improve the following deficits and impairments:  Increased fascial restricitons, Pain, Decreased coordination, Increased muscle spasms, Impaired tone, Decreased  activity tolerance, Decreased strength  Visit Diagnosis: Cramp and spasm  Muscle weakness (generalized)     Problem List Patient Active Problem List   Diagnosis Date Noted  . Tear of right hamstring 05/30/2017  . Abnormal leg finding 07/07/2015  . Myofascial pain dysfunction syndrome 11/21/2012  . Piriformis syndrome of right side 06/20/2012  . Nonallopathic lesion of lumbosacral region 06/20/2012  . Plantar fasciitis 04/17/2011  . Acquired hallux rigidus 04/17/2011    Earlie Counts, PT 12/30/18 1:21 PM   [facility] [address] [place], [place], [address] Phone: [phone]   Fax:  [phone]  Name: Kristin Andersen MRN: [id number] Date of Birth: September 22, 1956

## 2019-01-01 ENCOUNTER — Ambulatory Visit: Payer: 59 | Admitting: Physical Therapy

## 2019-01-01 ENCOUNTER — Encounter: Payer: Self-pay | Admitting: Physical Therapy

## 2019-01-01 DIAGNOSIS — R252 Cramp and spasm: Secondary | ICD-10-CM | POA: Diagnosis not present

## 2019-01-01 DIAGNOSIS — M6281 Muscle weakness (generalized): Secondary | ICD-10-CM

## 2019-01-01 NOTE — Therapy (Signed)
[facility] [address] [place], [place], [address] Phone: [phone]   Fax:  [phone]  Physical Therapy Treatment  Patient Details  Name: Kristin Andersen MRN: [id number] Date of Birth: 28-Apr-1956 Referring Provider (PT): Dr. Irine Seal   Encounter Date: 01/01/2019  PT End of Session - 01/01/19 1104    Visit Number  5    Date for PT Re-Evaluation  02/12/19    Authorization Type  [employer]    Authorization - Visit Number  5    Authorization - Number of Visits  21    PT Start Time  1104    PT Stop Time  2119    PT Time Calculation (min)  38 min    Activity Tolerance  Patient tolerated treatment well;No increased pain    Behavior During Therapy  WFL for tasks assessed/performed       Past Medical History:  Diagnosis Date  . Heart palpitations    Due to anxiety   . Hypothyroidism   . IC (interstitial cystitis)   . Myofascial pain dysfunction syndrome    Right side  . Osteoporosis   . Premature menopause   . Stomach ulcer   . Thyroid disease     Past Surgical History:  Procedure Laterality Date  . CERVICAL CONE BIOPSY    . CERVIX LESION DESTRUCTION    . CESAREAN SECTION    . CHOLECYSTECTOMY    . DILATION AND CURETTAGE OF UTERUS    . LIPOMA EXCISION Right 07/09/2017   Leg   . ROOT CANAL  11/13   3 on 2 teeth  . ROOT CANAL  04/16/14 and 04/27/14   x2  . TONSILLECTOMY      There were no vitals filed for this visit.  Subjective Assessment - 01/01/19 1105    Subjective  Pain is not worse. The tape helped the muscle pain in the left iliopsoas. I am using the medication for the urethral pain.     Patient Stated Goals  get rid of pain,     Currently in Pain?  Yes    Pain Score  3     Pain Location  Pelvis    Pain Orientation  Mid    Pain Descriptors / Indicators  Aching;Burning    Pain Type  Acute pain    Pain Onset  More than a month ago    Pain Frequency  Intermittent    Aggravating Factors    sitting    Pain Relieving Factors  standing    Multiple Pain Sites  No                    Pelvic Floor Special Questions - 01/01/19 0001    Pelvic Floor Internal Exam  Patient confirms identification and approves PT to assess pelvic floor and treatment    Exam Type  Vaginal        [facility] Adult PT Treatment/Exercise - 01/01/19 0001      Manual Therapy   Manual Therapy  Soft tissue mobilization;Internal Pelvic Floor    Internal Pelvic Floor  left obturator internist and levator ani              PT Education - 01/01/19 1143    Education Details  how to use the vaginal wand for soft tissue work    [employer]) Educated  Patient    Methods  Explanation;Demonstration    Comprehension  Verbalized understanding;Returned demonstration  PT Short Term Goals - 01/01/19 1145      PT SHORT TERM GOAL #3   Title  understand how to perform self perineal soft tissue work using a wand to reduce trigger points    Time  4    Period  Weeks    Status  On-going        PT Long Term Goals - 12/18/18 1050      PT LONG TERM GOAL #1   Title  independent with HEP and how to progress herself    Baseline  --    Time  8    Period  Weeks    Status  New    Target Date  02/12/19      PT LONG TERM GOAL #2   Title  pain with working out in the gym decreased >/= 80% with understanding how to perform correctly due to osteoporosis    Baseline  --    Time  8    Period  Weeks    Status  New    Target Date  02/12/19      PT LONG TERM GOAL #3   Title  urethral pain decreased >/= 80% due to improved tissue mobility and reduction of trigger points    Baseline  --    Time  8    Period  Weeks    Status  New    Target Date  02/12/19      PT LONG TERM GOAL #4   Title  pelvis in correct alignment and understands ways to perform muscle energy technique    Time  8    Period  Weeks    Status  New    Target Date  02/12/19      PT LONG TERM GOAL #5   Title  -------------     Baseline  -----------------            Plan - 01/01/19 1114    Clinical Impression Statement  Patient understands how to use the vaginal wand to work on her pelvic floor muscles to release trigger points. Patient left hip pain is better. Patient will benefit from skilled therapy to reduce pain, release fascial restrictions and improve function.     Rehab Potential  Excellent    Clinical Impairments Affecting Rehab Potential  osteoporosis with T-score -3.1    PT Frequency  2x / week    PT Duration  8 weeks    PT Treatment/Interventions  Biofeedback;Cryotherapy;Electrical Stimulation;Moist Heat;Iontophoresis 4mg /ml Dexamethasone;Ultrasound;Therapeutic activities;Therapeutic exercise;Neuromuscular re-education;Manual techniques;Patient/family education;Dry needling    PT Next Visit Plan  correct pelvis,  hip stretches, bridges, pelvic floor soft tissue work and piriformis;     PT Home Exercise Plan  Access Code: [email or web address]     Consulted and Agree with Plan of Care  Patient       Patient will benefit from skilled therapeutic intervention in order to improve the following deficits and impairments:  Increased fascial restricitons, Pain, Decreased coordination, Increased muscle spasms, Impaired tone, Decreased activity tolerance, Decreased strength  Visit Diagnosis: Cramp and spasm  Muscle weakness (generalized)     Problem List Patient Active Problem List   Diagnosis Date Noted  . Tear of right hamstring 05/30/2017  . Abnormal leg finding 07/07/2015  . Myofascial pain dysfunction syndrome 11/21/2012  . Piriformis syndrome of right side 06/20/2012  . Nonallopathic lesion of lumbosacral region 06/20/2012  . Plantar fasciitis 04/17/2011  . Acquired hallux rigidus 04/17/2011    Earlie Counts, PT 01/01/19  11:47 AM   [facility] [address], [place], [place], [address] Phone: [phone]   Fax:  [phone]  Name:  ARTEMIS KOLLER MRN: [id number] Date of Birth: 1956-07-29

## 2019-01-06 ENCOUNTER — Ambulatory Visit: Payer: 59 | Admitting: Physical Therapy

## 2019-01-06 ENCOUNTER — Encounter: Payer: Self-pay | Admitting: Physical Therapy

## 2019-01-06 DIAGNOSIS — M6281 Muscle weakness (generalized): Secondary | ICD-10-CM

## 2019-01-06 DIAGNOSIS — R252 Cramp and spasm: Secondary | ICD-10-CM | POA: Diagnosis not present

## 2019-01-06 NOTE — Therapy (Signed)
[facility] [address], [place], [place], [address] Phone: [phone]   Fax:  [phone]  Physical Therapy Treatment  Patient Details  Name: Kristin Andersen MRN: [id number] Date of Birth: [birth year] Referring Provider (PT): Dr. Irine Seal   Encounter Date: 01/06/2019  PT End of Session - 01/06/19 1142    Visit Number  6    Date for PT Re-Evaluation  02/12/19    Authorization Type  [employer]    Authorization - Visit Number  6    Authorization - Number of Visits  21    PT Start Time  1100    PT Stop Time  0254    PT Time Calculation (min)  43 min    Activity Tolerance  Patient tolerated treatment well;No increased pain    Behavior During Therapy  WFL for tasks assessed/performed       Past Medical History:  Diagnosis Date  . Heart palpitations    Due to anxiety   . Hypothyroidism   . IC (interstitial cystitis)   . Myofascial pain dysfunction syndrome    Right side  . Osteoporosis   . Premature menopause   . Stomach ulcer   . Thyroid disease     Past Surgical History:  Procedure Laterality Date  . CERVICAL CONE BIOPSY    . CERVIX LESION DESTRUCTION    . CESAREAN SECTION    . CHOLECYSTECTOMY    . DILATION AND CURETTAGE OF UTERUS    . LIPOMA EXCISION Right 07/09/2017   Leg   . ROOT CANAL  11/13   3 on 2 teeth  . ROOT CANAL  04/16/14 and 04/27/14   x2  . TONSILLECTOMY      There were no vitals filed for this visit.  Subjective Assessment - 01/06/19 1105    Subjective  My back pain is intermittent. The bladder pain continues. I am better mentally. Bladder pain is 60% better. I do not have pain with going to the bathroom but  still have the warmth feeling.     Patient Stated Goals  get rid of pain,     Currently in Pain?  Yes    Pain Score  2     Pain Location  Pelvis    Pain Orientation  Mid    Pain Descriptors / Indicators  Aching;Burning    Pain Type  Acute pain    Pain Onset  More than a month  ago    Pain Frequency  Intermittent    Aggravating Factors   sitting    Pain Relieving Factors  standing    Multiple Pain Sites  No         [facility] PT Assessment - 01/06/19 0001      Palpation   SI assessment   pelvis in correct alignment                Pelvic Floor Special Questions - 01/06/19 0001    Pelvic Floor Internal Exam  Patient confirms identification and approves PT to assess pelvic floor and treatment    Exam Type  Vaginal        [facility] Adult PT Treatment/Exercise - 01/06/19 0001      Lumbar Exercises: Supine   Clam  10 reps;1 second   each side   Clam Limitations  lower abdominal contraction    Bridge  10 reps;1 second   tactile cues for abdominal contraction   Isometric Hip Flexion  5 reps;5 seconds  Lumbar Exercises: Prone   Straight Leg Raise  10 reps;1 second   each side     Manual Therapy   Manual Therapy  Internal Pelvic Floor    Internal Pelvic Floor  bil. obturator internist and bil. sides of the urethra sphincter, sides of introitus             PT Education - 01/06/19 1125    Education Details  Access Code: [email or web address]     Person(s) Educated  Patient    Methods  Explanation;Demonstration;Verbal cues;Handout    Comprehension  Verbalized understanding;Returned demonstration       PT Short Term Goals - 01/06/19 1145      PT SHORT TERM GOAL #2   Title  pain with daily tasks decreased >/= 25% due to reduction of trigger points    Time  4    Period  Weeks    Status  Achieved      PT SHORT TERM GOAL #3   Title  understand how to perform self perineal soft tissue work using a wand to reduce trigger points    Time  4    Period  Weeks    Status  Achieved        PT Long Term Goals - 12/18/18 1050      PT LONG TERM GOAL #1   Title  independent with HEP and how to progress herself    Baseline  --    Time  8    Period  Weeks    Status  New    Target Date  02/12/19      PT LONG TERM GOAL #2   Title  pain with working  out in the gym decreased >/= 80% with understanding how to perform correctly due to osteoporosis    Baseline  --    Time  8    Period  Weeks    Status  New    Target Date  02/12/19      PT LONG TERM GOAL #3   Title  urethral pain decreased >/= 80% due to improved tissue mobility and reduction of trigger points    Baseline  --    Time  8    Period  Weeks    Status  New    Target Date  02/12/19      PT LONG TERM GOAL #4   Title  pelvis in correct alignment and understands ways to perform muscle energy technique    Time  8    Period  Weeks    Status  New    Target Date  02/12/19      PT LONG TERM GOAL #5   Title  -------------    Baseline  -----------------            Plan - 01/06/19 1110    Clinical Impression Statement  Bladder pain is 60% better. Patient has used the vaginal wand 1 time. Patient continues to have back pain. Patient is starting on core strength. Patient has tightness on bilateral sides of bladder. Patient fatiques with her core exercises. Patient will benefit from skilled therapy to reduce pain, release fascial restrictions and improve function.     Rehab Potential  Excellent    Clinical Impairments Affecting Rehab Potential  osteoporosis with T-score -3.1    PT Frequency  2x / week    PT Duration  8 weeks    PT Treatment/Interventions  Biofeedback;Cryotherapy;Electrical Stimulation;Moist Heat;Iontophoresis 4mg /ml Dexamethasone;Ultrasound;Therapeutic activities;Therapeutic exercise;Neuromuscular re-education;Manual techniques;Patient/family education;Dry needling  PT Next Visit Plan   hip flexor stretches,  pelvic floor soft tissue work and piriformis;     PT Home Exercise Plan  Access Code: [email or web address]     Consulted and Agree with Plan of Care  Patient       Patient will benefit from skilled therapeutic intervention in order to improve the following deficits and impairments:  Increased fascial restricitons, Pain, Decreased coordination, Increased muscle  spasms, Impaired tone, Decreased activity tolerance, Decreased strength  Visit Diagnosis: Cramp and spasm  Muscle weakness (generalized)     Problem List Patient Active Problem List   Diagnosis Date Noted  . Tear of right hamstring 05/30/2017  . Abnormal leg finding 07/07/2015  . Myofascial pain dysfunction syndrome 11/21/2012  . Piriformis syndrome of right side 06/20/2012  . Nonallopathic lesion of lumbosacral region 06/20/2012  . Plantar fasciitis 04/17/2011  . Acquired hallux rigidus 04/17/2011    Earlie Counts, PT 01/06/19 11:46 AM   [facility] [address], [place], [place], [address] Phone: [phone]   Fax:  [phone]  Name: Kristin Andersen MRN: [id number] Date of Birth: 1956/08/08

## 2019-01-06 NOTE — Patient Instructions (Signed)
Access Code: [email or web address]  URL: [email or web address]  Date: 01/06/2019  Prepared by: Earlie Counts   Exercises  Cat-Camel - 10 reps - 1 sets - 1x daily - 7x weekly  Supine Butterfly Groin Stretch - 1 reps - 1 sets - 1 min hold - 1x daily - 7x weekly  Child's Pose Stretch - 2 reps - 1 sets - 30 sec hold - 1x daily - 7x weekly  Childs Pose Side Bend - 2 reps - 1 sets - 30 sec hold - 1x daily - 7x weekly  Hooklying Active Hamstring Stretch - 2 reps - 1 sets - 30 sec hold - 1x daily - 7x weekly  Supine Bridge - 10 reps - 1 sets - 1x daily - 7x weekly  Bent Knee Fallouts - 10 reps - 1 sets - 1x daily - 7x weekly  Hooklying Isometric Hip Flexion - 5 reps - 1 sets - 5 sec hold - 1x daily - 7x weekly  Prone Hip Extension with Pillow Under Abdomen - 10 reps - 1 sets - 1x daily - 7x weekly  [facility] [address], [place], [place] [address] Phone # [phone] Fax [phone]

## 2019-01-08 ENCOUNTER — Ambulatory Visit: Payer: 59 | Admitting: Physical Therapy

## 2019-01-08 ENCOUNTER — Encounter: Payer: Self-pay | Admitting: Physical Therapy

## 2019-01-08 DIAGNOSIS — M6281 Muscle weakness (generalized): Secondary | ICD-10-CM

## 2019-01-08 DIAGNOSIS — R252 Cramp and spasm: Secondary | ICD-10-CM

## 2019-01-08 NOTE — Therapy (Signed)
[facility] [address], [place], [place], [address] Phone: [phone]   Fax:  [phone]  Physical Therapy Treatment  Patient Details  Name: Kristin Andersen MRN: [id number] Date of Birth: Nov 21, 1955 Referring Provider (PT): Dr. Irine Seal   Encounter Date: 01/08/2019  PT End of Session - 01/08/19 1452    Visit Number  7    Date for PT Re-Evaluation  02/12/19    Authorization Type  [employer]    Authorization - Visit Number  7    Authorization - Number of Visits  21    PT Start Time  1400    PT Stop Time  2878    PT Time Calculation (min)  45 min    Activity Tolerance  Patient tolerated treatment well;No increased pain    Behavior During Therapy  WFL for tasks assessed/performed       Past Medical History:  Diagnosis Date  . Heart palpitations    Due to anxiety   . Hypothyroidism   . IC (interstitial cystitis)   . Myofascial pain dysfunction syndrome    Right side  . Osteoporosis   . Premature menopause   . Stomach ulcer   . Thyroid disease     Past Surgical History:  Procedure Laterality Date  . CERVICAL CONE BIOPSY    . CERVIX LESION DESTRUCTION    . CESAREAN SECTION    . CHOLECYSTECTOMY    . DILATION AND CURETTAGE OF UTERUS    . LIPOMA EXCISION Right 07/09/2017   Leg   . ROOT CANAL  11/13   3 on 2 teeth  . ROOT CANAL  04/16/14 and 04/27/14   x2  . TONSILLECTOMY      There were no vitals filed for this visit.  Subjective Assessment - 01/08/19 1409    Subjective  Bladder pain is better today. I had accupuncture yesterday and felt better. I have not thought about going to the bathroom today. I have not had the warm feeling today.     Patient Stated Goals  get rid of pain,     Currently in Pain?  No/denies                       [facility] Adult PT Treatment/Exercise - 01/08/19 0001      Manual Therapy   Manual Therapy  Myofascial release    Myofascial Release  fascial release  around the bladder, umbilicus, pubovesical ligament, inguinal ligament, worked on bladder mobility, and scar tissue release               PT Short Term Goals - 01/06/19 1145      PT SHORT TERM GOAL #2   Title  pain with daily tasks decreased >/= 25% due to reduction of trigger points    Time  4    Period  Weeks    Status  Achieved      PT SHORT TERM GOAL #3   Title  understand how to perform self perineal soft tissue work using a wand to reduce trigger points    Time  4    Period  Weeks    Status  Achieved        PT Long Term Goals - 12/18/18 1050      PT LONG TERM GOAL #1   Title  independent with HEP and how to progress herself    Baseline  --    Time  8  Period  Weeks    Status  New    Target Date  02/12/19      PT LONG TERM GOAL #2   Title  pain with working out in the gym decreased >/= 80% with understanding how to perform correctly due to osteoporosis    Baseline  --    Time  8    Period  Weeks    Status  New    Target Date  02/12/19      PT LONG TERM GOAL #3   Title  urethral pain decreased >/= 80% due to improved tissue mobility and reduction of trigger points    Baseline  --    Time  8    Period  Weeks    Status  New    Target Date  02/12/19      PT LONG TERM GOAL #4   Title  pelvis in correct alignment and understands ways to perform muscle energy technique    Time  8    Period  Weeks    Status  New    Target Date  02/12/19      PT LONG TERM GOAL #5   Title  -------------    Baseline  -----------------            Plan - 01/08/19 1412    Clinical Impression Statement  Patient has tightness in the bladder area and along the abdominal scar. Patient has less bladder pain. Patient has not had buring in the bladder for several days. Patient will benefi tfrom skilled therapy to reduce pain, release fascial restrictions and improve function.     Rehab Potential  Excellent    Clinical Impairments Affecting Rehab Potential  osteoporosis  with T-score -3.1    PT Frequency  2x / week    PT Duration  8 weeks    PT Treatment/Interventions  Biofeedback;Cryotherapy;Electrical Stimulation;Moist Heat;Iontophoresis 4mg /ml Dexamethasone;Ultrasound;Therapeutic activities;Therapeutic exercise;Neuromuscular re-education;Manual techniques;Patient/family education;Dry needling    PT Next Visit Plan  educate on self scar massage; stretch the hip flexor    PT Home Exercise Plan  Access Code: [email or web address]     Consulted and Agree with Plan of Care  Patient       Patient will benefit from skilled therapeutic intervention in order to improve the following deficits and impairments:  Increased fascial restricitons, Pain, Decreased coordination, Increased muscle spasms, Impaired tone, Decreased activity tolerance, Decreased strength  Visit Diagnosis: Cramp and spasm  Muscle weakness (generalized)     Problem List Patient Active Problem List   Diagnosis Date Noted  . Tear of right hamstring 05/30/2017  . Abnormal leg finding 07/07/2015  . Myofascial pain dysfunction syndrome 11/21/2012  . Piriformis syndrome of right side 06/20/2012  . Nonallopathic lesion of lumbosacral region 06/20/2012  . Plantar fasciitis 04/17/2011  . Acquired hallux rigidus 04/17/2011    Earlie Counts, PT 01/08/19 2:52 PM   [facility] [address], [place], [place], [address] Phone: [phone]   Fax:  [phone]  Name: Kristin Andersen MRN: [id number] Date of Birth: 06-24-1956

## 2019-01-12 ENCOUNTER — Ambulatory Visit: Payer: 59 | Admitting: Physical Therapy

## 2019-01-12 ENCOUNTER — Encounter: Payer: Self-pay | Admitting: Physical Therapy

## 2019-01-12 DIAGNOSIS — R252 Cramp and spasm: Secondary | ICD-10-CM | POA: Diagnosis not present

## 2019-01-12 DIAGNOSIS — M6281 Muscle weakness (generalized): Secondary | ICD-10-CM

## 2019-01-12 NOTE — Therapy (Signed)
[facility] [address], [place], [place], [address] Phone: [phone]   Fax:  [phone]  Physical Therapy Treatment  Patient Details  Name: Kristin Andersen MRN: [id number] Date of Birth: 1956-10-09 Referring Provider (PT): Dr. Irine Seal   Encounter Date: 01/12/2019  PT End of Session - 01/12/19 1139    Visit Number  8    Date for PT Re-Evaluation  02/12/19    Authorization Type  [employer]    Authorization - Visit Number  8    Authorization - Number of Visits  21    PT Start Time  1100    PT Stop Time  1139    PT Time Calculation (min)  39 min    Activity Tolerance  Patient tolerated treatment well;No increased pain    Behavior During Therapy  WFL for tasks assessed/performed       Past Medical History:  Diagnosis Date  . Heart palpitations    Due to anxiety   . Hypothyroidism   . IC (interstitial cystitis)   . Myofascial pain dysfunction syndrome    Right side  . Osteoporosis   . Premature menopause   . Stomach ulcer   . Thyroid disease     Past Surgical History:  Procedure Laterality Date  . CERVICAL CONE BIOPSY    . CERVIX LESION DESTRUCTION    . CESAREAN SECTION    . CHOLECYSTECTOMY    . DILATION AND CURETTAGE OF UTERUS    . LIPOMA EXCISION Right 07/09/2017   Leg   . ROOT CANAL  11/13   3 on 2 teeth  . ROOT CANAL  04/16/14 and 04/27/14   x2  . TONSILLECTOMY      There were no vitals filed for this visit.  Subjective Assessment - 01/12/19 1103    Subjective  I am still feeling better. I do not think about it. I do not have the hot feeling anymore. No bladder pain today.     Patient Stated Goals  get rid of pain,     Currently in Pain?  Yes    Pain Orientation  Right    Pain Descriptors / Indicators  Aching;Sore    Pain Type  Acute pain    Pain Onset  More than a month ago    Pain Frequency  Intermittent    Aggravating Factors   not sure    Pain Relieving Factors  not sure    Multiple  Pain Sites  No                    Pelvic Floor Special Questions - 01/12/19 0001    Pelvic Floor Internal Exam  Patient confirms identification and approves PT to assess pelvic floor and treatment    Exam Type  Vaginal        [facility] Adult PT Treatment/Exercise - 01/12/19 0001      Manual Therapy   Manual Therapy  Internal Pelvic Floor;Soft tissue mobilization    Soft tissue mobilization  left quadratus, iliopsoas, left TFL    Internal Pelvic Floor  left obturator internist, levator ani, coccygeus, pirirformis left side of bladder               PT Short Term Goals - 01/06/19 1145      PT SHORT TERM GOAL #2   Title  pain with daily tasks decreased >/= 25% due to reduction of trigger points    Time  4  Period  Weeks    Status  Achieved      PT SHORT TERM GOAL #3   Title  understand how to perform self perineal soft tissue work using a wand to reduce trigger points    Time  4    Period  Weeks    Status  Achieved        PT Long Term Goals - 01/12/19 1143      PT LONG TERM GOAL #1   Title  independent with HEP and how to progress herself    Time  8    Period  Weeks    Status  On-going      PT LONG TERM GOAL #2   Title  pain with working out in the gym decreased >/= 80% with understanding how to perform correctly due to osteoporosis    Baseline  not back at the gym yet    Time  8    Period  Weeks    Status  On-going      PT LONG TERM GOAL #3   Title  urethral pain decreased >/= 80% due to improved tissue mobility and reduction of trigger points    Time  8    Period  Weeks    Status  On-going      PT LONG TERM GOAL #4   Title  pelvis in correct alignment and understands ways to perform muscle energy technique    Time  8    Period  Weeks    Status  On-going            Plan - 01/12/19 1139    Clinical Impression Statement  Patient had no pain in pelvic floor after manual work. Patient had many trigger points in the left pelvic floor  muscles. Patient bladder pain has reduced. Patient has increased mobility of the bladder. Patient is not having the warm feeling of the bladder with urination. Patient will start coming 1 time per week. Patient will benefit from skilled therapy to reduce pain, release fascial restrictions and improve function.     Rehab Potential  Excellent    Clinical Impairments Affecting Rehab Potential  osteoporosis with T-score -3.1    PT Frequency  2x / week    PT Duration  8 weeks    PT Treatment/Interventions  Biofeedback;Cryotherapy;Electrical Stimulation;Moist Heat;Iontophoresis 4mg /ml Dexamethasone;Ultrasound;Therapeutic activities;Therapeutic exercise;Neuromuscular re-education;Manual techniques;Patient/family education;Dry needling    PT Next Visit Plan  educate on self scar massage; stretch the hip flexor; check pelvic alignment; work on gym exercises; soft tissue work to left pelvic floor muscles    PT Home Exercise Plan  Access Code: [email or web address]     Consulted and Agree with Plan of Care  Patient       Patient will benefit from skilled therapeutic intervention in order to improve the following deficits and impairments:  Increased fascial restricitons, Pain, Decreased coordination, Increased muscle spasms, Impaired tone, Decreased activity tolerance, Decreased strength  Visit Diagnosis: Cramp and spasm  Muscle weakness (generalized)     Problem List Patient Active Problem List   Diagnosis Date Noted  . Tear of right hamstring 05/30/2017  . Abnormal leg finding 07/07/2015  . Myofascial pain dysfunction syndrome 11/21/2012  . Piriformis syndrome of right side 06/20/2012  . Nonallopathic lesion of lumbosacral region 06/20/2012  . Plantar fasciitis 04/17/2011  . Acquired hallux rigidus 04/17/2011    Earlie Counts, PT 01/12/19 11:45 AM   [facility] [address]  [place], [place], [address] Phone: [phone]   Fax:   [phone]  Name: Kristin Andersen MRN: [id number] Date of Birth: [birth year]

## 2019-01-15 ENCOUNTER — Encounter: Payer: 59 | Admitting: Physical Therapy

## 2019-01-19 ENCOUNTER — Ambulatory Visit: Payer: PPO | Attending: Urology | Admitting: Physical Therapy

## 2019-01-19 ENCOUNTER — Encounter: Payer: Self-pay | Admitting: Physical Therapy

## 2019-01-19 DIAGNOSIS — R252 Cramp and spasm: Secondary | ICD-10-CM | POA: Diagnosis not present

## 2019-01-19 DIAGNOSIS — M6281 Muscle weakness (generalized): Secondary | ICD-10-CM | POA: Diagnosis not present

## 2019-01-19 NOTE — Patient Instructions (Signed)
Start on the recumbent bike for 15 to 20 minutes  Leg press with 50# 3 sets of 5  Hamstring  With 20 # 3 sets of 5  Quads 10# 3 sets of [place] [address], [place], [place] [address] Phone # [phone] Fax [phone]

## 2019-01-19 NOTE — Therapy (Addendum)
[facility] [address], [place], [place], [address] Phone: [phone]   Fax:  [phone]  Physical Therapy Treatment  Patient Details  Name: Kristin Andersen MRN: [id number] Date of Birth: 1956/03/28 Referring Provider (PT): Dr. Irine Seal   Encounter Date: 01/19/2019  PT End of Session - 01/19/19 1240    Visit Number  9    Date for PT Re-Evaluation  02/12/19    Authorization Type  [employer]    Authorization - Visit Number  9    Authorization - Number of Visits  21    PT Start Time  0350    PT Stop Time  1310    PT Time Calculation (min)  40 min    Activity Tolerance  Patient tolerated treatment well;No increased pain    Behavior During Therapy  WFL for tasks assessed/performed       Past Medical History:  Diagnosis Date  . Heart palpitations    Due to anxiety   . Hypothyroidism   . IC (interstitial cystitis)   . Myofascial pain dysfunction syndrome    Right side  . Osteoporosis   . Premature menopause   . Stomach ulcer   . Thyroid disease     Past Surgical History:  Procedure Laterality Date  . CERVICAL CONE BIOPSY    . CERVIX LESION DESTRUCTION    . CESAREAN SECTION    . CHOLECYSTECTOMY    . DILATION AND CURETTAGE OF UTERUS    . LIPOMA EXCISION Right 07/09/2017   Leg   . ROOT CANAL  11/13   3 on 2 teeth  . ROOT CANAL  04/16/14 and 04/27/14   x2  . TONSILLECTOMY      There were no vitals filed for this visit.  Subjective Assessment - 01/19/19 1236    Subjective  I am feeling better. I have no bladder pain over the weekend. The hip pain is better.     Patient Stated Goals  get rid of pain,     Currently in Pain?  Yes    Pain Score  2     Pain Location  Hip    Pain Orientation  Left    Pain Descriptors / Indicators  Aching    Pain Type  Acute pain    Pain Onset  More than a month ago    Pain Frequency  Intermittent    Aggravating Factors   twist to the right or left    Pain Relieving  Factors  not twist    Multiple Pain Sites  No                    Pelvic Floor Special Questions - 01/19/19 0001    Pelvic Floor Internal Exam  Patient confirms identification and approves PT to assess pelvic floor and treatment    Exam Type  Vaginal    Strength  good squeeze, good lift, able to hold agaisnt strong resistance        [facility] Adult PT Treatment/Exercise - 01/19/19 0001      Exercises   Exercises  Other Exercises    Other Exercises   education on exercises to start at the gym      [place] [facility]  Internal Pelvic Floor    Internal Pelvic Floor  left obturator internist, levator ani, coccygeus, pirirformis left side of bladder             PT Education -  01/19/19 1313    Education Details  beginning gym exercises    Person(s) Educated  Patient    Methods  Explanation;Handout    Comprehension  Verbalized understanding       PT Short Term Goals - 01/06/19 1145      PT SHORT TERM GOAL #2   Title  pain with daily tasks decreased >/= 25% due to reduction of trigger points    Time  4    Period  Weeks    Status  Achieved      PT SHORT TERM GOAL #3   Title  understand how to perform self perineal soft tissue work using a wand to reduce trigger points    Time  4    Period  Weeks    Status  Achieved        PT Long Term Goals - 01/19/19 1316      PT LONG TERM GOAL #3   Title  urethral pain decreased >/= 80% due to improved tissue mobility and reduction of trigger points    Time  8    Period  Weeks    Status  Achieved            Plan - 01/19/19 1241    Clinical Impression Statement  Patient is not having bladder pain since last visit. Patient trigger points in the pelvic floor are not as intense instead feels like soreness. Patient is ready to start at the gym using recumbent bike and staying away from the treatmill. Patient will benefit from skilled therapy to reduce pain, release fascial restrictions and improve  function.     Rehab Potential  Excellent    Clinical Impairments Affecting Rehab Potential  osteoporosis with T-score -3.1    PT Frequency  2x / week    PT Duration  8 weeks    PT Treatment/Interventions  Biofeedback;Cryotherapy;Electrical Stimulation;Moist Heat;Iontophoresis 79m/ml Dexamethasone;Ultrasound;Therapeutic activities;Therapeutic exercise;Neuromuscular re-education;Manual techniques;Patient/family education;Dry needling    PT Next Visit Plan  educate on self scar massage; stretch the hip flexor; check pelvic alignment; work on gym exercises; soft tissue work to left pelvic floor muscles    PT Home Exercise Plan  Access Code: [email or web address]    Consulted and Agree with Plan of Care  Patient       Patient will benefit from skilled therapeutic intervention in order to improve the following deficits and impairments:  Increased fascial restricitons, Pain, Decreased coordination, Increased muscle spasms, Impaired tone, Decreased activity tolerance, Decreased strength  Visit Diagnosis: Cramp and spasm  Muscle weakness (generalized)     Problem List Patient Active Problem List   Diagnosis Date Noted  . Tear of right hamstring 05/30/2017  . Abnormal leg finding 07/07/2015  . Myofascial pain dysfunction syndrome 11/21/2012  . Piriformis syndrome of right side 06/20/2012  . Nonallopathic lesion of lumbosacral region 06/20/2012  . Plantar fasciitis 04/17/2011  . Acquired hallux rigidus 04/17/2011    CEarlie Counts PT 01/19/19 1:17 PM   [facility] [address] [place] [address]Phone: [phone]  Fax:  [phone] Name: Kristin SADIKMRN: [id number] of Birth: 3Apr 24, 1957PHYSICAL THERAPY DISCHARGE SUMMARY  Visits from Start of Care: 9  Current functional level related to goals / functional outcomes: See above. Called patient and she is doing well.    Remaining deficits: None at this time  according to phone call with patient.    Education / Equipment: HEP Plan: Patient agrees to  discharge.  Patient goals were partially met. Patient is being discharged due to being pleased with the current functional level. Thank you for the referral. Earlie Counts, PT 02/27/19 3:59 PM   ?????

## 2019-01-26 ENCOUNTER — Encounter: Payer: 59 | Admitting: Physical Therapy

## 2019-01-29 DIAGNOSIS — J301 Allergic rhinitis due to pollen: Secondary | ICD-10-CM | POA: Diagnosis not present

## 2019-01-29 DIAGNOSIS — J3089 Other allergic rhinitis: Secondary | ICD-10-CM | POA: Diagnosis not present

## 2019-01-29 DIAGNOSIS — J3081 Allergic rhinitis due to animal (cat) (dog) hair and dander: Secondary | ICD-10-CM | POA: Diagnosis not present

## 2019-02-02 ENCOUNTER — Ambulatory Visit: Payer: PPO | Admitting: Physical Therapy

## 2019-02-05 ENCOUNTER — Telehealth: Payer: Self-pay | Admitting: Physical Therapy

## 2019-02-05 NOTE — Telephone Encounter (Signed)
Called patient about cancelling her next 2 appointments due to center closing for 2 weeks. She wanted therapist to add exercises to her [place].  Earlie Counts, PT @3 /19/2020@ 5:34 PM  Access Code: [email or web address]  URL: [email or web address]  Date: 02/05/2019  Prepared by: Earlie Counts   Exercises  Cat-Camel - 10 reps - 1 sets - 1x daily - 7x weekly  Supine Butterfly Groin Stretch - 1 reps - 1 sets - 1 min hold - 1x daily - 7x weekly  Child's Pose Stretch - 2 reps - 1 sets - 30 sec hold - 1x daily - 7x weekly  Childs Pose Side Bend - 2 reps - 1 sets - 30 sec hold - 1x daily - 7x weekly  Hooklying Active Hamstring Stretch - 2 reps - 1 sets - 30 sec hold - 1x daily - 7x weekly  Supine Bridge - 10 reps - 1 sets - 1x daily - 7x weekly  Bent Knee Fallouts - 10 reps - 1 sets - 1x daily - 7x weekly  Hooklying Isometric Hip Flexion - 5 reps - 1 sets - 5 sec hold - 1x daily - 7x weekly  Prone Hip Extension with Pillow Under Abdomen - 10 reps - 1 sets - 1x daily - 7x weekly  Seated Piriformis Stretch with Trunk Bend - 2 reps - 1 sets - 30 sec hold - 1x daily - 7x weekly  Seated Hamstring Stretch - 2 reps - 1 sets - 30 sec hold - 1x daily - 7x weekly  Hooklying Sequential Leg March and Lower - 10 reps - 1 sets - 1x daily - 7x weekly  Quadruped Alternating Leg Extensions - 10 reps - 1 sets - 1x daily - 7x weekly  Quadruped Alternating Arm Lift - 10 reps - 1 sets - 1x daily - 7x weekly  Clamshell with Resistance - 10 reps - 1 sets - 1x daily - 7x weekly  Sidelying Hip Circles - 10 reps - 1 sets - 1x daily - 7x weekly  Sidelying Hip Adduction - 10 reps - 2 sets - 1x daily - 7x weekly  Bridge with Abduction and Resistance Loop - 10 reps - 1 sets - 1x daily - 7x weekly

## 2019-02-09 ENCOUNTER — Encounter: Payer: 59 | Admitting: Physical Therapy

## 2019-02-16 ENCOUNTER — Encounter: Payer: Self-pay | Admitting: Physical Therapy

## 2019-02-20 DIAGNOSIS — J3089 Other allergic rhinitis: Secondary | ICD-10-CM | POA: Diagnosis not present

## 2019-02-20 DIAGNOSIS — J301 Allergic rhinitis due to pollen: Secondary | ICD-10-CM | POA: Diagnosis not present

## 2019-02-20 DIAGNOSIS — J3081 Allergic rhinitis due to animal (cat) (dog) hair and dander: Secondary | ICD-10-CM | POA: Diagnosis not present

## 2019-03-05 DIAGNOSIS — J3081 Allergic rhinitis due to animal (cat) (dog) hair and dander: Secondary | ICD-10-CM | POA: Diagnosis not present

## 2019-03-05 DIAGNOSIS — J301 Allergic rhinitis due to pollen: Secondary | ICD-10-CM | POA: Diagnosis not present

## 2019-03-05 DIAGNOSIS — J3089 Other allergic rhinitis: Secondary | ICD-10-CM | POA: Diagnosis not present

## 2019-03-20 DIAGNOSIS — J3089 Other allergic rhinitis: Secondary | ICD-10-CM | POA: Diagnosis not present

## 2019-03-20 DIAGNOSIS — J301 Allergic rhinitis due to pollen: Secondary | ICD-10-CM | POA: Diagnosis not present

## 2019-03-20 DIAGNOSIS — J3081 Allergic rhinitis due to animal (cat) (dog) hair and dander: Secondary | ICD-10-CM | POA: Diagnosis not present

## 2019-03-31 ENCOUNTER — Ambulatory Visit: Payer: PRIVATE HEALTH INSURANCE | Admitting: Obstetrics & Gynecology

## 2019-04-02 DIAGNOSIS — J301 Allergic rhinitis due to pollen: Secondary | ICD-10-CM | POA: Diagnosis not present

## 2019-04-02 DIAGNOSIS — J3089 Other allergic rhinitis: Secondary | ICD-10-CM | POA: Diagnosis not present

## 2019-04-02 DIAGNOSIS — J3081 Allergic rhinitis due to animal (cat) (dog) hair and dander: Secondary | ICD-10-CM | POA: Diagnosis not present

## 2019-04-17 DIAGNOSIS — J301 Allergic rhinitis due to pollen: Secondary | ICD-10-CM | POA: Diagnosis not present

## 2019-04-17 DIAGNOSIS — J3089 Other allergic rhinitis: Secondary | ICD-10-CM | POA: Diagnosis not present

## 2019-04-17 DIAGNOSIS — J3081 Allergic rhinitis due to animal (cat) (dog) hair and dander: Secondary | ICD-10-CM | POA: Diagnosis not present

## 2019-05-07 DIAGNOSIS — J301 Allergic rhinitis due to pollen: Secondary | ICD-10-CM | POA: Diagnosis not present

## 2019-05-07 DIAGNOSIS — J3081 Allergic rhinitis due to animal (cat) (dog) hair and dander: Secondary | ICD-10-CM | POA: Diagnosis not present

## 2019-05-07 DIAGNOSIS — J3089 Other allergic rhinitis: Secondary | ICD-10-CM | POA: Diagnosis not present

## 2019-05-13 DIAGNOSIS — M67833 Other specified disorders of tendon, right wrist: Secondary | ICD-10-CM | POA: Diagnosis not present

## 2019-05-13 DIAGNOSIS — M25531 Pain in right wrist: Secondary | ICD-10-CM | POA: Diagnosis not present

## 2019-05-13 DIAGNOSIS — M1811 Unilateral primary osteoarthritis of first carpometacarpal joint, right hand: Secondary | ICD-10-CM | POA: Diagnosis not present

## 2019-06-02 DIAGNOSIS — J3081 Allergic rhinitis due to animal (cat) (dog) hair and dander: Secondary | ICD-10-CM | POA: Diagnosis not present

## 2019-06-02 DIAGNOSIS — J3089 Other allergic rhinitis: Secondary | ICD-10-CM | POA: Diagnosis not present

## 2019-06-02 DIAGNOSIS — J301 Allergic rhinitis due to pollen: Secondary | ICD-10-CM | POA: Diagnosis not present

## 2019-06-12 ENCOUNTER — Encounter: Payer: Self-pay | Admitting: Obstetrics & Gynecology

## 2019-06-12 DIAGNOSIS — Z1231 Encounter for screening mammogram for malignant neoplasm of breast: Secondary | ICD-10-CM | POA: Diagnosis not present

## 2019-06-18 DIAGNOSIS — J3081 Allergic rhinitis due to animal (cat) (dog) hair and dander: Secondary | ICD-10-CM | POA: Diagnosis not present

## 2019-06-18 DIAGNOSIS — J301 Allergic rhinitis due to pollen: Secondary | ICD-10-CM | POA: Diagnosis not present

## 2019-06-18 DIAGNOSIS — J3089 Other allergic rhinitis: Secondary | ICD-10-CM | POA: Diagnosis not present

## 2019-06-22 ENCOUNTER — Other Ambulatory Visit (HOSPITAL_COMMUNITY)
Admission: RE | Admit: 2019-06-22 | Discharge: 2019-06-22 | Disposition: A | Discharge status: Home / Self Care | Payer: PPO | Source: Ambulatory Visit | Attending: Obstetrics & Gynecology | Admitting: Obstetrics & Gynecology

## 2019-06-22 ENCOUNTER — Ambulatory Visit (INDEPENDENT_AMBULATORY_CARE_PROVIDER_SITE_OTHER): Payer: PPO | Admitting: Obstetrics & Gynecology

## 2019-06-22 ENCOUNTER — Encounter: Payer: Self-pay | Admitting: Obstetrics & Gynecology

## 2019-06-22 ENCOUNTER — Other Ambulatory Visit: Payer: Self-pay

## 2019-06-22 VITALS — BP 110/70 | HR 80 | Temp 97.4°F | Ht 58.5 in | Wt 108.0 lb

## 2019-06-22 DIAGNOSIS — Z01419 Encounter for gynecological examination (general) (routine) without abnormal findings: Secondary | ICD-10-CM | POA: Diagnosis not present

## 2019-06-22 DIAGNOSIS — Z124 Encounter for screening for malignant neoplasm of cervix: Secondary | ICD-10-CM

## 2019-06-22 MED ORDER — ESTRADIOL 0.1 MG/GM VA CREA: TOPICAL_CREAM | VAGINAL | 3 refills | Status: DC
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
## 2019-06-24 LAB — CYTOLOGY - PAP
Adequacy: ABSENT
Diagnosis: NEGATIVE

## 2019-06-26 DIAGNOSIS — J301 Allergic rhinitis due to pollen: Secondary | ICD-10-CM | POA: Diagnosis not present

## 2019-06-26 DIAGNOSIS — J3081 Allergic rhinitis due to animal (cat) (dog) hair and dander: Secondary | ICD-10-CM | POA: Diagnosis not present

## 2019-06-26 DIAGNOSIS — J3089 Other allergic rhinitis: Secondary | ICD-10-CM | POA: Diagnosis not present

## 2019-07-01 DIAGNOSIS — J3089 Other allergic rhinitis: Secondary | ICD-10-CM | POA: Diagnosis not present

## 2019-07-03 DIAGNOSIS — J3081 Allergic rhinitis due to animal (cat) (dog) hair and dander: Secondary | ICD-10-CM | POA: Diagnosis not present

## 2019-07-03 DIAGNOSIS — J301 Allergic rhinitis due to pollen: Secondary | ICD-10-CM | POA: Diagnosis not present

## 2019-07-03 DIAGNOSIS — J3089 Other allergic rhinitis: Secondary | ICD-10-CM | POA: Diagnosis not present

## 2019-07-16 DIAGNOSIS — J301 Allergic rhinitis due to pollen: Secondary | ICD-10-CM | POA: Diagnosis not present

## 2019-07-16 DIAGNOSIS — J3089 Other allergic rhinitis: Secondary | ICD-10-CM | POA: Diagnosis not present

## 2019-07-16 DIAGNOSIS — J3081 Allergic rhinitis due to animal (cat) (dog) hair and dander: Secondary | ICD-10-CM | POA: Diagnosis not present

## 2019-07-23 DIAGNOSIS — D1801 Hemangioma of skin and subcutaneous tissue: Secondary | ICD-10-CM | POA: Diagnosis not present

## 2019-07-23 DIAGNOSIS — J3089 Other allergic rhinitis: Secondary | ICD-10-CM | POA: Diagnosis not present

## 2019-07-23 DIAGNOSIS — L821 Other seborrheic keratosis: Secondary | ICD-10-CM | POA: Diagnosis not present

## 2019-07-23 DIAGNOSIS — L570 Actinic keratosis: Secondary | ICD-10-CM | POA: Diagnosis not present

## 2019-07-23 DIAGNOSIS — L814 Other melanin hyperpigmentation: Secondary | ICD-10-CM | POA: Diagnosis not present

## 2019-07-23 DIAGNOSIS — D229 Melanocytic nevi, unspecified: Secondary | ICD-10-CM | POA: Diagnosis not present

## 2019-07-23 DIAGNOSIS — D485 Neoplasm of uncertain behavior of skin: Secondary | ICD-10-CM | POA: Diagnosis not present

## 2019-07-23 DIAGNOSIS — L819 Disorder of pigmentation, unspecified: Secondary | ICD-10-CM | POA: Diagnosis not present

## 2019-07-24 DIAGNOSIS — L820 Inflamed seborrheic keratosis: Secondary | ICD-10-CM | POA: Diagnosis not present

## 2019-07-30 DIAGNOSIS — J301 Allergic rhinitis due to pollen: Secondary | ICD-10-CM | POA: Diagnosis not present

## 2019-07-30 DIAGNOSIS — J3081 Allergic rhinitis due to animal (cat) (dog) hair and dander: Secondary | ICD-10-CM | POA: Diagnosis not present

## 2019-07-30 DIAGNOSIS — J3089 Other allergic rhinitis: Secondary | ICD-10-CM | POA: Diagnosis not present

## 2019-08-13 DIAGNOSIS — J3081 Allergic rhinitis due to animal (cat) (dog) hair and dander: Secondary | ICD-10-CM | POA: Diagnosis not present

## 2019-08-13 DIAGNOSIS — J3089 Other allergic rhinitis: Secondary | ICD-10-CM | POA: Diagnosis not present

## 2019-08-13 DIAGNOSIS — J301 Allergic rhinitis due to pollen: Secondary | ICD-10-CM | POA: Diagnosis not present

## 2019-08-27 DIAGNOSIS — J3081 Allergic rhinitis due to animal (cat) (dog) hair and dander: Secondary | ICD-10-CM | POA: Diagnosis not present

## 2019-08-27 DIAGNOSIS — J301 Allergic rhinitis due to pollen: Secondary | ICD-10-CM | POA: Diagnosis not present

## 2019-08-27 DIAGNOSIS — J3089 Other allergic rhinitis: Secondary | ICD-10-CM | POA: Diagnosis not present

## 2019-09-02 DIAGNOSIS — J3089 Other allergic rhinitis: Secondary | ICD-10-CM | POA: Diagnosis not present

## 2019-09-07 ENCOUNTER — Other Ambulatory Visit: Payer: Self-pay

## 2019-09-07 DIAGNOSIS — Z20822 Contact with and (suspected) exposure to covid-19: Secondary | ICD-10-CM

## 2019-09-07 DIAGNOSIS — Z20828 Contact with and (suspected) exposure to other viral communicable diseases: Secondary | ICD-10-CM | POA: Diagnosis not present

## 2019-09-08 LAB — NOVEL CORONAVIRUS, NAA: SARS-CoV-2, NAA: NOT DETECTED

## 2019-09-11 DIAGNOSIS — J3089 Other allergic rhinitis: Secondary | ICD-10-CM | POA: Diagnosis not present

## 2019-09-18 ENCOUNTER — Other Ambulatory Visit: Payer: Self-pay | Admitting: Family Medicine

## 2019-09-18 ENCOUNTER — Ambulatory Visit
Admission: RE | Admit: 2019-09-18 | Discharge: 2019-09-18 | Disposition: A | Discharge status: Home / Self Care | Payer: PPO | Source: Ambulatory Visit | Attending: Family Medicine | Admitting: Family Medicine

## 2019-09-18 DIAGNOSIS — J00 Acute nasopharyngitis [common cold]: Secondary | ICD-10-CM | POA: Diagnosis not present

## 2019-09-18 DIAGNOSIS — R05 Cough: Secondary | ICD-10-CM

## 2019-09-18 DIAGNOSIS — R059 Cough, unspecified: Secondary | ICD-10-CM

## 2019-09-18 DIAGNOSIS — R0989 Other specified symptoms and signs involving the circulatory and respiratory systems: Secondary | ICD-10-CM | POA: Diagnosis not present

## 2019-09-24 DIAGNOSIS — J3081 Allergic rhinitis due to animal (cat) (dog) hair and dander: Secondary | ICD-10-CM | POA: Diagnosis not present

## 2019-09-24 DIAGNOSIS — J3089 Other allergic rhinitis: Secondary | ICD-10-CM | POA: Diagnosis not present

## 2019-09-24 DIAGNOSIS — J301 Allergic rhinitis due to pollen: Secondary | ICD-10-CM | POA: Diagnosis not present

## 2019-10-09 DIAGNOSIS — J3081 Allergic rhinitis due to animal (cat) (dog) hair and dander: Secondary | ICD-10-CM | POA: Diagnosis not present

## 2019-10-09 DIAGNOSIS — J3089 Other allergic rhinitis: Secondary | ICD-10-CM | POA: Diagnosis not present

## 2019-10-09 DIAGNOSIS — J301 Allergic rhinitis due to pollen: Secondary | ICD-10-CM | POA: Diagnosis not present

## 2019-10-22 DIAGNOSIS — J3081 Allergic rhinitis due to animal (cat) (dog) hair and dander: Secondary | ICD-10-CM | POA: Diagnosis not present

## 2019-10-22 DIAGNOSIS — J3089 Other allergic rhinitis: Secondary | ICD-10-CM | POA: Diagnosis not present

## 2019-10-22 DIAGNOSIS — J301 Allergic rhinitis due to pollen: Secondary | ICD-10-CM | POA: Diagnosis not present

## 2019-10-27 DIAGNOSIS — R1310 Dysphagia, unspecified: Secondary | ICD-10-CM | POA: Diagnosis not present

## 2019-10-27 DIAGNOSIS — K219 Gastro-esophageal reflux disease without esophagitis: Secondary | ICD-10-CM | POA: Diagnosis not present

## 2019-10-27 DIAGNOSIS — K449 Diaphragmatic hernia without obstruction or gangrene: Secondary | ICD-10-CM | POA: Diagnosis not present

## 2019-10-27 DIAGNOSIS — Z86010 Personal history of colonic polyps: Secondary | ICD-10-CM | POA: Diagnosis not present

## 2019-10-29 DIAGNOSIS — J3089 Other allergic rhinitis: Secondary | ICD-10-CM | POA: Diagnosis not present

## 2019-10-29 DIAGNOSIS — J3081 Allergic rhinitis due to animal (cat) (dog) hair and dander: Secondary | ICD-10-CM | POA: Diagnosis not present

## 2019-10-29 DIAGNOSIS — K2100 Gastro-esophageal reflux disease with esophagitis, without bleeding: Secondary | ICD-10-CM | POA: Diagnosis not present

## 2019-10-29 DIAGNOSIS — J301 Allergic rhinitis due to pollen: Secondary | ICD-10-CM | POA: Diagnosis not present

## 2019-11-18 DIAGNOSIS — J3089 Other allergic rhinitis: Secondary | ICD-10-CM | POA: Diagnosis not present

## 2019-11-18 DIAGNOSIS — J301 Allergic rhinitis due to pollen: Secondary | ICD-10-CM | POA: Diagnosis not present

## 2019-11-18 DIAGNOSIS — J3081 Allergic rhinitis due to animal (cat) (dog) hair and dander: Secondary | ICD-10-CM | POA: Diagnosis not present

## 2019-12-13 ENCOUNTER — Ambulatory Visit: Payer: PPO | Attending: Internal Medicine

## 2019-12-13 DIAGNOSIS — Z23 Encounter for immunization: Secondary | ICD-10-CM

## 2019-12-13 NOTE — Progress Notes (Signed)
   Covid-19 Vaccination Clinic  Name:  Kristin Andersen    MRN: [id number] DOB: [birth year]  12/13/2019  [name] was observed post Covid-19 immunization for 30 minutes based on pre-vaccination screening without incidence. She was provided with Vaccine Information Sheet and instruction to access the V-Safe system.   [name] was instructed to call 911 with any severe reactions post vaccine: Marland Kitchen Difficulty breathing  . Swelling of your face and throat  . A fast heartbeat  . A bad rash all over your body  . Dizziness and weakness    Immunizations Administered    Name Date Dose VIS Date Route   Pfizer COVID-19 Vaccine 12/13/2019 11:09 AM 0.3 mL 10/30/2019 Intramuscular   Manufacturer: [place]   Lot: [id number]   [place]: [id number]

## 2019-12-21 DIAGNOSIS — Z79899 Other long term (current) drug therapy: Secondary | ICD-10-CM | POA: Diagnosis not present

## 2019-12-21 DIAGNOSIS — N952 Postmenopausal atrophic vaginitis: Secondary | ICD-10-CM | POA: Diagnosis not present

## 2019-12-21 DIAGNOSIS — F411 Generalized anxiety disorder: Secondary | ICD-10-CM | POA: Diagnosis not present

## 2019-12-21 DIAGNOSIS — J3081 Allergic rhinitis due to animal (cat) (dog) hair and dander: Secondary | ICD-10-CM | POA: Diagnosis not present

## 2019-12-21 DIAGNOSIS — M810 Age-related osteoporosis without current pathological fracture: Secondary | ICD-10-CM | POA: Diagnosis not present

## 2019-12-21 DIAGNOSIS — E559 Vitamin D deficiency, unspecified: Secondary | ICD-10-CM | POA: Diagnosis not present

## 2019-12-21 DIAGNOSIS — N3010 Interstitial cystitis (chronic) without hematuria: Secondary | ICD-10-CM | POA: Diagnosis not present

## 2019-12-21 DIAGNOSIS — J301 Allergic rhinitis due to pollen: Secondary | ICD-10-CM | POA: Diagnosis not present

## 2019-12-21 DIAGNOSIS — K219 Gastro-esophageal reflux disease without esophagitis: Secondary | ICD-10-CM | POA: Diagnosis not present

## 2019-12-21 DIAGNOSIS — E039 Hypothyroidism, unspecified: Secondary | ICD-10-CM | POA: Diagnosis not present

## 2019-12-21 DIAGNOSIS — Z0000 Encounter for general adult medical examination without abnormal findings: Secondary | ICD-10-CM | POA: Diagnosis not present

## 2019-12-21 DIAGNOSIS — G4700 Insomnia, unspecified: Secondary | ICD-10-CM | POA: Diagnosis not present

## 2019-12-21 DIAGNOSIS — J309 Allergic rhinitis, unspecified: Secondary | ICD-10-CM | POA: Diagnosis not present

## 2019-12-24 ENCOUNTER — Ambulatory Visit: Payer: PPO

## 2019-12-24 DIAGNOSIS — J3081 Allergic rhinitis due to animal (cat) (dog) hair and dander: Secondary | ICD-10-CM | POA: Diagnosis not present

## 2019-12-24 DIAGNOSIS — J3089 Other allergic rhinitis: Secondary | ICD-10-CM | POA: Diagnosis not present

## 2019-12-24 DIAGNOSIS — J301 Allergic rhinitis due to pollen: Secondary | ICD-10-CM | POA: Diagnosis not present

## 2019-12-29 DIAGNOSIS — J3081 Allergic rhinitis due to animal (cat) (dog) hair and dander: Secondary | ICD-10-CM | POA: Diagnosis not present

## 2019-12-29 DIAGNOSIS — J301 Allergic rhinitis due to pollen: Secondary | ICD-10-CM | POA: Diagnosis not present

## 2019-12-29 DIAGNOSIS — J3089 Other allergic rhinitis: Secondary | ICD-10-CM | POA: Diagnosis not present

## 2020-01-01 DIAGNOSIS — J301 Allergic rhinitis due to pollen: Secondary | ICD-10-CM | POA: Diagnosis not present

## 2020-01-01 DIAGNOSIS — J3081 Allergic rhinitis due to animal (cat) (dog) hair and dander: Secondary | ICD-10-CM | POA: Diagnosis not present

## 2020-01-01 DIAGNOSIS — J3089 Other allergic rhinitis: Secondary | ICD-10-CM | POA: Diagnosis not present

## 2020-01-04 ENCOUNTER — Ambulatory Visit: Payer: PPO | Attending: Internal Medicine

## 2020-01-04 NOTE — Progress Notes (Signed)
   Covid-19 Vaccination Clinic  Name:  Kristin Andersen    MRN: [id number] DOB: [birth year]  01/04/2020  Ms. Naatz was observed post Covid-19 immunization for 30 minutes based on pre-vaccination screening without incidence. She was provided with Vaccine Information Sheet and instruction to access the V-Safe system.   Ms. Karpen was instructed to call 911 with any severe reactions post vaccine: Marland Kitchen Difficulty breathing  . Swelling of your face and throat  . A fast heartbeat  . A bad rash all over your body  . Dizziness and weakness

## 2020-01-05 DIAGNOSIS — M1711 Unilateral primary osteoarthritis, right knee: Secondary | ICD-10-CM | POA: Diagnosis not present

## 2020-01-05 DIAGNOSIS — M25561 Pain in right knee: Secondary | ICD-10-CM | POA: Diagnosis not present

## 2020-01-06 DIAGNOSIS — J3081 Allergic rhinitis due to animal (cat) (dog) hair and dander: Secondary | ICD-10-CM | POA: Diagnosis not present

## 2020-01-06 DIAGNOSIS — J301 Allergic rhinitis due to pollen: Secondary | ICD-10-CM | POA: Diagnosis not present

## 2020-01-20 ENCOUNTER — Telehealth: Payer: Self-pay | Admitting: Obstetrics & Gynecology

## 2020-01-20 NOTE — Telephone Encounter (Signed)
Spoke with patient. Patient has a current Rx for Estrace vaginal cream, $90 for tube with new insurance plan, asking if ok to fill as generic, estradiol?   Advised ok to fill as generic. Reviewed options of Good Rx, if needed. Questions answered. Patient is aware to call if she has any additional questions or needs any additional assistance.   Routing to provider for final review. Patient is agreeable to disposition. Will close encounter.

## 2020-01-20 NOTE — Telephone Encounter (Signed)
Patient has a question regarding her prescription for estrace cream.

## 2020-01-21 DIAGNOSIS — J301 Allergic rhinitis due to pollen: Secondary | ICD-10-CM | POA: Diagnosis not present

## 2020-01-21 DIAGNOSIS — J3081 Allergic rhinitis due to animal (cat) (dog) hair and dander: Secondary | ICD-10-CM | POA: Diagnosis not present

## 2020-01-21 DIAGNOSIS — J3089 Other allergic rhinitis: Secondary | ICD-10-CM | POA: Diagnosis not present

## 2020-02-16 DIAGNOSIS — M1711 Unilateral primary osteoarthritis, right knee: Secondary | ICD-10-CM | POA: Diagnosis not present

## 2020-02-18 DIAGNOSIS — J301 Allergic rhinitis due to pollen: Secondary | ICD-10-CM | POA: Diagnosis not present

## 2020-02-18 DIAGNOSIS — J3081 Allergic rhinitis due to animal (cat) (dog) hair and dander: Secondary | ICD-10-CM | POA: Diagnosis not present

## 2020-02-18 DIAGNOSIS — J3089 Other allergic rhinitis: Secondary | ICD-10-CM | POA: Diagnosis not present

## 2020-02-29 DIAGNOSIS — M1711 Unilateral primary osteoarthritis, right knee: Secondary | ICD-10-CM | POA: Diagnosis not present

## 2020-03-07 DIAGNOSIS — M25561 Pain in right knee: Secondary | ICD-10-CM | POA: Diagnosis not present

## 2020-03-10 DIAGNOSIS — M25561 Pain in right knee: Secondary | ICD-10-CM | POA: Diagnosis not present

## 2020-03-14 DIAGNOSIS — M25561 Pain in right knee: Secondary | ICD-10-CM | POA: Diagnosis not present

## 2020-03-17 DIAGNOSIS — M25561 Pain in right knee: Secondary | ICD-10-CM | POA: Diagnosis not present

## 2020-03-21 DIAGNOSIS — J301 Allergic rhinitis due to pollen: Secondary | ICD-10-CM | POA: Diagnosis not present

## 2020-03-21 DIAGNOSIS — J3081 Allergic rhinitis due to animal (cat) (dog) hair and dander: Secondary | ICD-10-CM | POA: Diagnosis not present

## 2020-03-21 DIAGNOSIS — J3089 Other allergic rhinitis: Secondary | ICD-10-CM | POA: Diagnosis not present

## 2020-03-24 DIAGNOSIS — M25561 Pain in right knee: Secondary | ICD-10-CM | POA: Diagnosis not present

## 2020-04-20 DIAGNOSIS — J3089 Other allergic rhinitis: Secondary | ICD-10-CM | POA: Diagnosis not present

## 2020-04-20 DIAGNOSIS — J3081 Allergic rhinitis due to animal (cat) (dog) hair and dander: Secondary | ICD-10-CM | POA: Diagnosis not present

## 2020-04-20 DIAGNOSIS — J301 Allergic rhinitis due to pollen: Secondary | ICD-10-CM | POA: Diagnosis not present

## 2020-05-06 DIAGNOSIS — M1711 Unilateral primary osteoarthritis, right knee: Secondary | ICD-10-CM | POA: Diagnosis not present

## 2020-05-24 ENCOUNTER — Encounter: Payer: Self-pay | Admitting: Obstetrics & Gynecology

## 2020-05-24 ENCOUNTER — Telehealth: Payer: Self-pay | Admitting: *Deleted

## 2020-05-24 DIAGNOSIS — K649 Unspecified hemorrhoids: Secondary | ICD-10-CM

## 2020-05-24 NOTE — Telephone Encounter (Signed)
[facility] At my last visit, Dr. Sabra Heck recommended a surgeon to address my hemorrhoids, but I don't remember her name. Could you send me the name of that surgeon? Thank you so much! Wende Mott

## 2020-05-24 NOTE — Telephone Encounter (Signed)
Pt sent mychart message today and reviewed notes from [facility] and OV. No referral noted for hemorrhoids for pt.  Routing to Dr Sabra Heck for recommendations.

## 2020-05-25 DIAGNOSIS — J3081 Allergic rhinitis due to animal (cat) (dog) hair and dander: Secondary | ICD-10-CM | POA: Diagnosis not present

## 2020-05-25 DIAGNOSIS — J301 Allergic rhinitis due to pollen: Secondary | ICD-10-CM | POA: Diagnosis not present

## 2020-05-25 DIAGNOSIS — J3089 Other allergic rhinitis: Secondary | ICD-10-CM | POA: Diagnosis not present

## 2020-05-25 NOTE — Telephone Encounter (Signed)
Dr Henri Medal at [facility].

## 2020-05-25 NOTE — Telephone Encounter (Signed)
Spoke with pt. Pt given update and recommendations per Dr Sabra Heck. Pt agreeable and verbalized understanding. Pt to call and make appt.  Referral placed for Dr Leighton Ruff at [place].   Routing to Dr Sabra Heck  Encounter closed  Cc: Basilia Jumbo for referral

## 2020-06-12 IMAGING — DX DG CHEST 2V
2 series · 2 of 2 positions shown · non-contrast
Comparison: 07/08/2006

CLINICAL DATA: Chest cold

EXAM:
CHEST - 2 VIEW

[dg chest 2 view (1 of 2)]
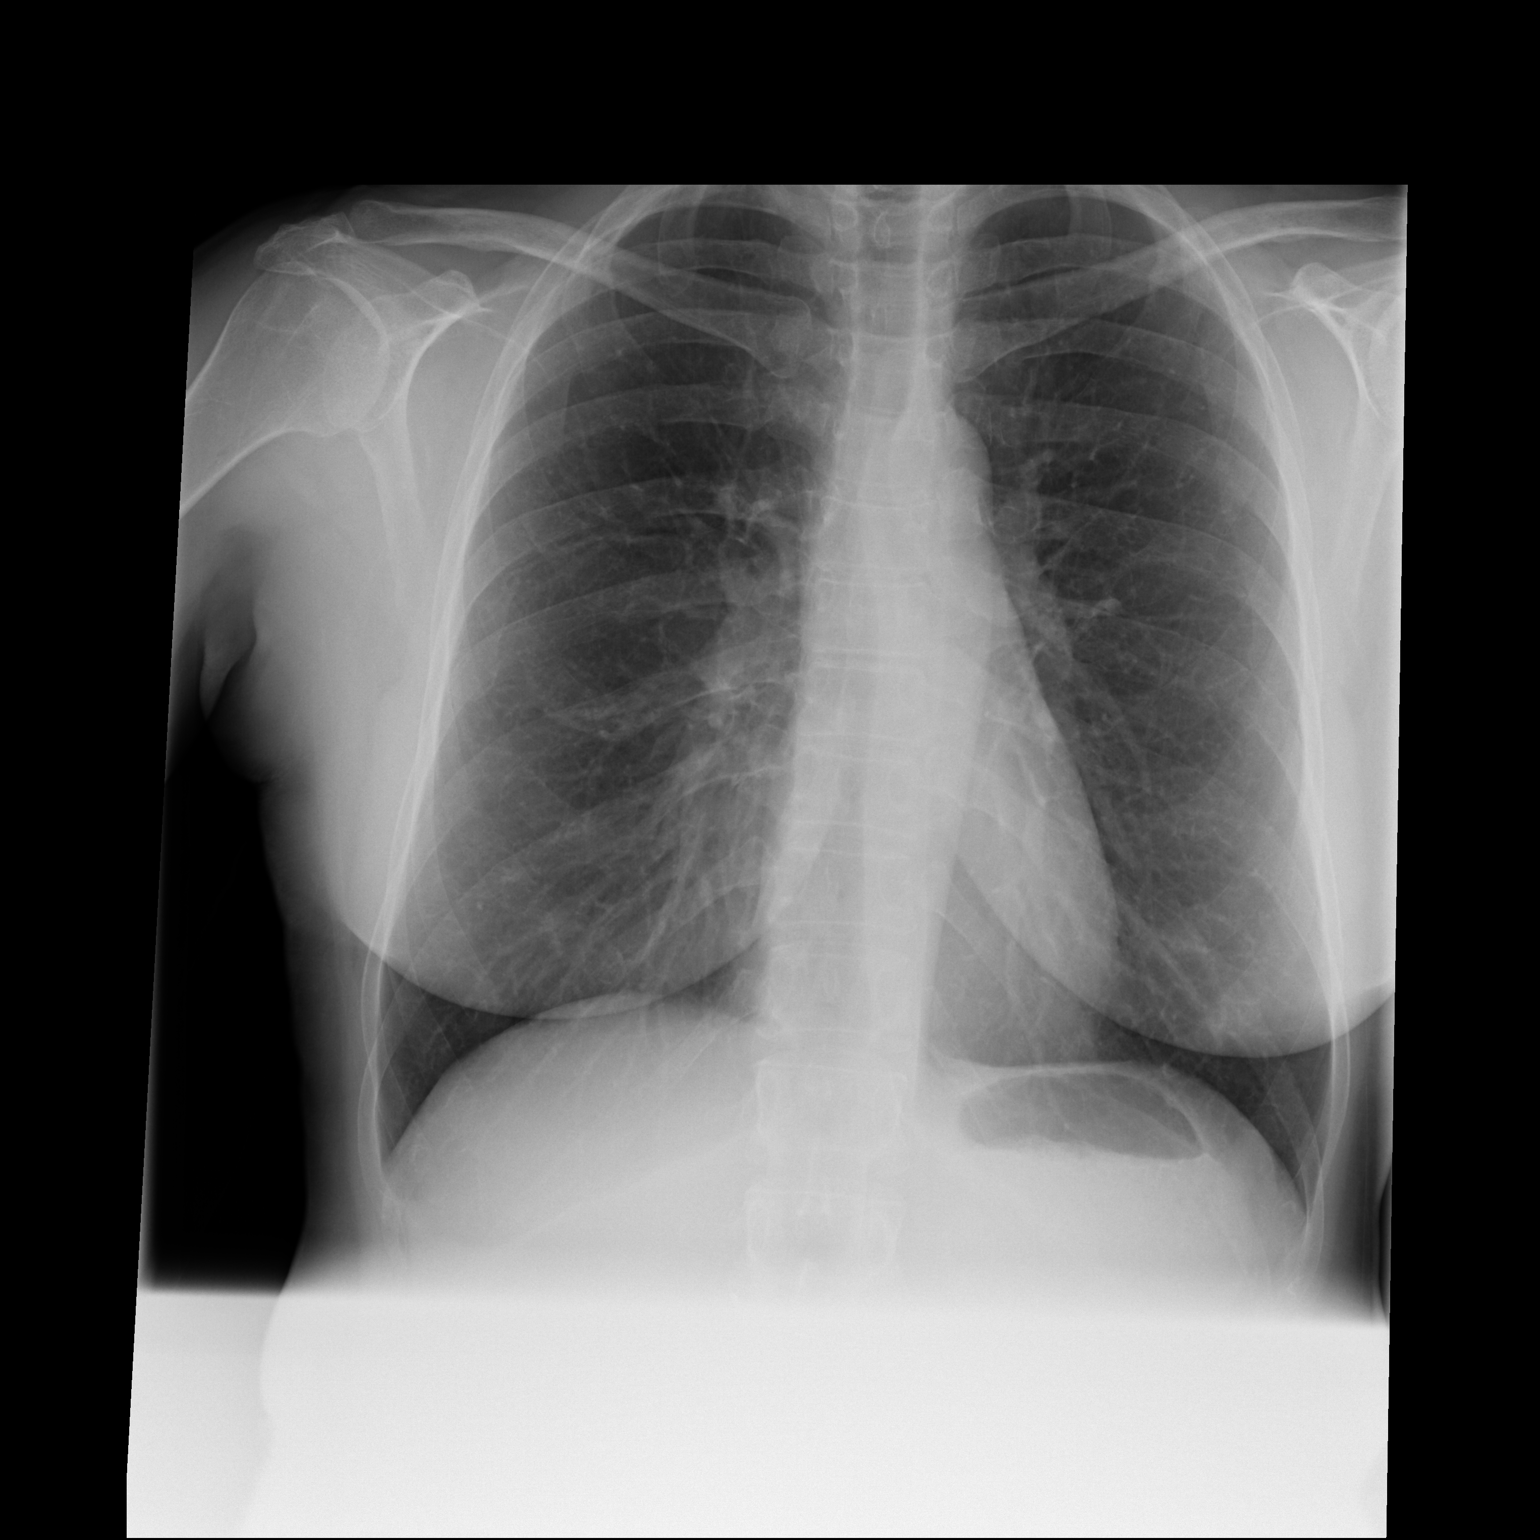

[dg chest 2 view (2 of 2)]
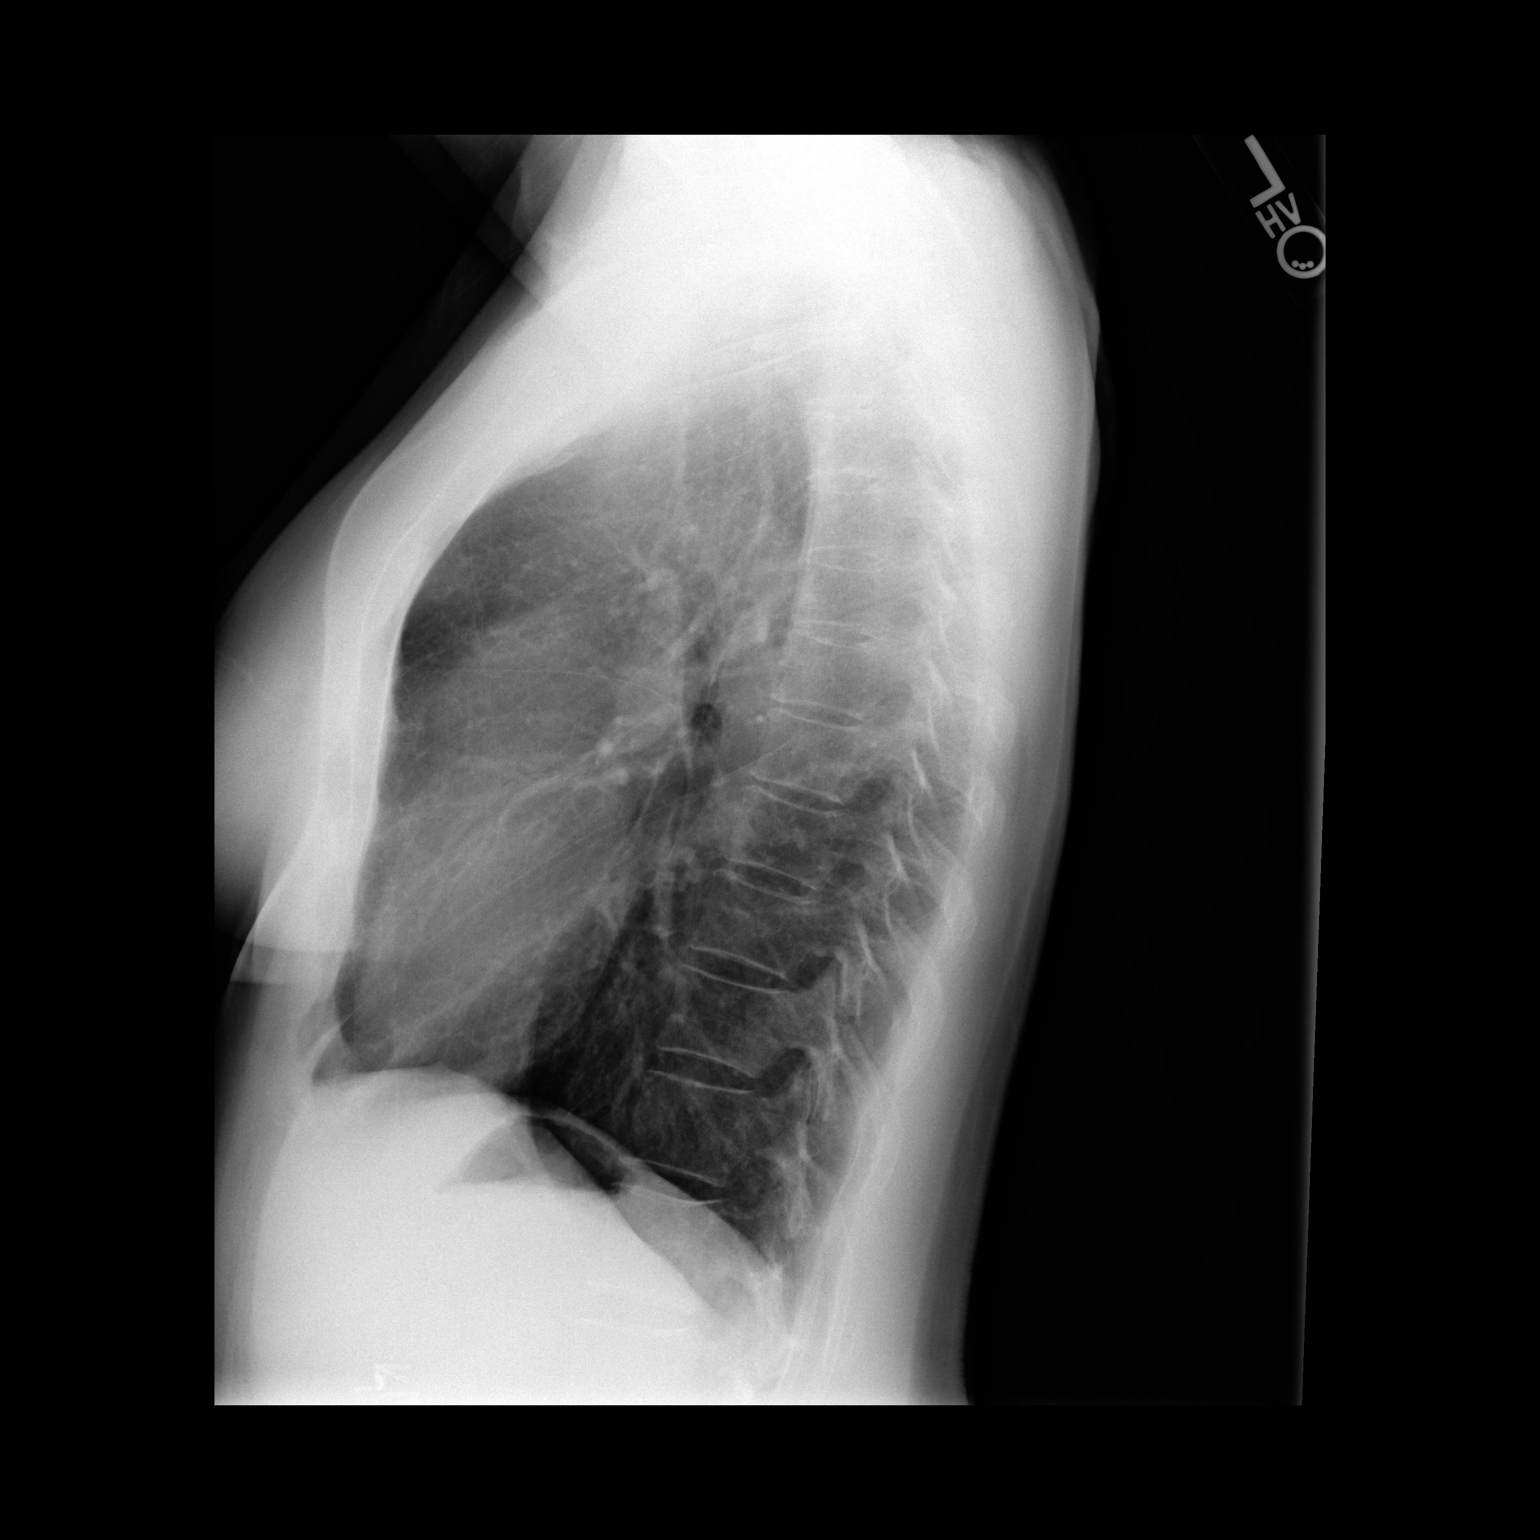

[2 of 2 positions shown; findings below may reference images not displayed]

FINDINGS: The heart size and mediastinal contours are within normal limits.
Both lungs are clear. The visualized skeletal structures are
unremarkable.
IMPRESSION: No acute abnormality of the lungs.

## 2020-06-21 DIAGNOSIS — J301 Allergic rhinitis due to pollen: Secondary | ICD-10-CM | POA: Diagnosis not present

## 2020-06-21 DIAGNOSIS — J3081 Allergic rhinitis due to animal (cat) (dog) hair and dander: Secondary | ICD-10-CM | POA: Diagnosis not present

## 2020-06-21 DIAGNOSIS — J3089 Other allergic rhinitis: Secondary | ICD-10-CM | POA: Diagnosis not present

## 2020-06-27 DIAGNOSIS — M810 Age-related osteoporosis without current pathological fracture: Secondary | ICD-10-CM | POA: Diagnosis not present

## 2020-06-27 DIAGNOSIS — Z1231 Encounter for screening mammogram for malignant neoplasm of breast: Secondary | ICD-10-CM | POA: Diagnosis not present

## 2020-06-27 DIAGNOSIS — M8588 Other specified disorders of bone density and structure, other site: Secondary | ICD-10-CM | POA: Diagnosis not present

## 2020-07-07 DIAGNOSIS — K642 Third degree hemorrhoids: Secondary | ICD-10-CM | POA: Diagnosis not present

## 2020-07-11 ENCOUNTER — Telehealth: Payer: Self-pay

## 2020-07-11 NOTE — Telephone Encounter (Signed)
Patient notified of bone density results. Bone density shows osteoporosis in right & left femur but it has improved. It shows 4% improvement on the right & 5% on the left. The spine also went from osteoporosis to osteopenia. Recheck in 2-3 yrs.  Patient was very excited about this news. She said her pcp checks her vitamin d & she has been taking vitamin d 4,000iu daily.

## 2020-07-27 DIAGNOSIS — J3081 Allergic rhinitis due to animal (cat) (dog) hair and dander: Secondary | ICD-10-CM | POA: Diagnosis not present

## 2020-07-27 DIAGNOSIS — J3089 Other allergic rhinitis: Secondary | ICD-10-CM | POA: Diagnosis not present

## 2020-07-27 DIAGNOSIS — J301 Allergic rhinitis due to pollen: Secondary | ICD-10-CM | POA: Diagnosis not present

## 2020-08-09 DIAGNOSIS — K642 Third degree hemorrhoids: Secondary | ICD-10-CM | POA: Diagnosis not present

## 2020-08-22 DIAGNOSIS — J301 Allergic rhinitis due to pollen: Secondary | ICD-10-CM | POA: Diagnosis not present

## 2020-08-22 DIAGNOSIS — J3089 Other allergic rhinitis: Secondary | ICD-10-CM | POA: Diagnosis not present

## 2020-08-22 DIAGNOSIS — J3081 Allergic rhinitis due to animal (cat) (dog) hair and dander: Secondary | ICD-10-CM | POA: Diagnosis not present

## 2020-08-23 DIAGNOSIS — M1711 Unilateral primary osteoarthritis, right knee: Secondary | ICD-10-CM | POA: Diagnosis not present

## 2020-08-23 DIAGNOSIS — M7051 Other bursitis of knee, right knee: Secondary | ICD-10-CM | POA: Diagnosis not present

## 2020-08-29 DIAGNOSIS — L728 Other follicular cysts of the skin and subcutaneous tissue: Secondary | ICD-10-CM | POA: Diagnosis not present

## 2020-08-29 DIAGNOSIS — L570 Actinic keratosis: Secondary | ICD-10-CM | POA: Diagnosis not present

## 2020-08-29 NOTE — Progress Notes (Signed)
64 y.o. G2P2 Married White or Caucasian female here for breast and pelvic exam.  I am also following her for osteoporosis management, her bone mineral density testing, and for vaginal atrophic changes. . Reports son who is 31 is newly engaged.  His fiance is 30.  Brother and his wife retired and moved to [place].  Husband's brother has trasnitioned but his mother is unaware.  Granddaughter is 32 and pt sees her almost every day.    She saw Dr. Marcello Moores about her hemorrhoids.  She did have a hemorrhoid banding.    Denies vaginal bleeding.  Patient's last menstrual period was 11/19/1989.          Sexually active: Yes.    H/O STD:  no  Health Maintenance: PCP:  Dr. Mayra Neer.  Last wellness appt was 12/2019.  Did blood work at that appt:   Vaccines are up to date:  Yes except for pneumovax Colonoscopy:  2017, follow up 5 years MMG:  06/27/2020, normal per pt BMD:  06/27/2020.  Hip measurements still show osteoporosis but was improved 4-5% on each side.   Last pap smear:  09/18/16 with neg HR HPV, 8/3/202 neg H/o abnormal pap smear:  In her 30's, had conization, normal pap smears since   reports that she has never smoked. She has never used smokeless tobacco. She reports current alcohol use of about 10.0 standard drinks of alcohol per week. She reports that she does not use drugs.  Past Medical History:  Diagnosis Date  . Arthritis    in knee  . Heart palpitations    Due to anxiety   . Hypothyroidism   . IC (interstitial cystitis)   . Myofascial pain dysfunction syndrome    Right side  . Osteoporosis   . Stomach ulcer   . Thyroid disease     Past Surgical History:  Procedure Laterality Date  . CERVICAL CONE BIOPSY     around age 15  . CERVIX LESION DESTRUCTION    . CESAREAN SECTION    . CHOLECYSTECTOMY    . DILATION AND CURETTAGE OF UTERUS    . LIPOMA EXCISION Right 07/09/2017   Leg   . ROOT CANAL  11/13   3 on 2 teeth  . ROOT CANAL  04/16/14 and 04/27/14   x2  . TONSILLECTOMY       Current Outpatient Medications  Medication Sig Dispense Refill  . dexmethylphenidate (FOCALIN) 2.5 MG tablet Take 2.5 mg by mouth 2 (two) times daily.    Marland Kitchen estradiol (ESTRACE) 0.1 MG/GM vaginal cream 1 gram pv twice weekly. 42.5 g 3  . FLUoxetine (PROZAC) 20 MG tablet Take 20 mg by mouth daily.    Marland Kitchen levothyroxine (SYNTHROID) 50 MCG tablet Take 1 tablet by mouth daily.    Marland Kitchen OLANZapine (ZYPREXA) 2.5 MG tablet Take by mouth at bedtime. Takes 1/4    . pantoprazole (PROTONIX) 40 MG tablet Take 40 mg by mouth daily.    Marland Kitchen VITAMIN D PO Take 4,000 Int'l Units by mouth.    . EPIPEN 2-PAK 0.3 MG/0.3ML SOAJ injection INJECT AS DIRECTED AS NEEDED (Patient not taking: Reported on 09/01/2020)  1   No current facility-administered medications for this visit.    Family History  Problem Relation Age of Onset  . Alzheimer's disease Mother   . Liver cancer Father   . Cancer Other     Review of Systems  Exam:    Exam:   BP 112/68   Pulse 64  Resp 16   Ht 4' 10.75" (1.492 m)   Wt 100 lb (45.4 kg)   LMP 11/19/1989   BMI 20.37 kg/m   Height: 4' 10.75" (149.2 cm)  General appearance: alert, cooperative and appears stated age Head: Normocephalic, without obvious abnormality, atraumatic Neck: no adenopathy, supple, symmetrical, trachea midline and thyroid normal to inspection and palpation Lungs: clear to auscultation bilaterally Breasts: normal appearance, no masses or tenderness Heart: regular rate and rhythm Abdomen: soft, non-tender; bowel sounds normal; no masses,  no organomegaly Extremities: extremities normal, atraumatic, no cyanosis or edema Skin: Skin color, texture, turgor normal. No rashes or lesions Lymph nodes: Cervical, supraclavicular, and axillary nodes normal. No abnormal inguinal nodes palpated Neurologic: Grossly normal   Pelvic: External genitalia:  no lesions              Urethra:  normal appearing urethra with no masses, tenderness or lesions               Bartholins and Skenes: normal                 Vagina: normal appearing vagina with normal color and discharge, no lesions              Cervix: no lesions              Pap taken: No. Bimanual Exam:  Uterus:  normal size, contour, position, consistency, mobility, non-tender              Adnexa: normal adnexa and no mass, fullness, tenderness               Rectovaginal: Confirms               Anus:  normal sphincter tone, no lesions  Chaperone, Olene Floss, CMA, was present for exam.  A:  Breast and Pelvic exam PMP, no HRT Vaginal atrophic changes managed with vaginal estrogen cream Osteoporosis H/o cervical conization >25 years ago, normal pap smears since Anxiety and chronic insomnia   P:   Mammogram guidelines reviewed pap smear neg 2020.  Not indicated. RF for estrace vaginal cream 1 gram pv twice weekly. . #30/2RF. Plan to repeat BMD in 2 years Lab work, vaccines with Dr. Brigitte Pulse.  Aware pneumovax is due. Return 1-2 years for breast and pelvic exam Return annually or prn  25 minutes of total time was spent for this patient encounter, including preparation, face-to-face counseling with the patient and coordination of care, and documentation of the encounter.

## 2020-08-30 DIAGNOSIS — J3089 Other allergic rhinitis: Secondary | ICD-10-CM | POA: Diagnosis not present

## 2020-09-01 ENCOUNTER — Ambulatory Visit (INDEPENDENT_AMBULATORY_CARE_PROVIDER_SITE_OTHER): Payer: PPO | Admitting: Obstetrics & Gynecology

## 2020-09-01 ENCOUNTER — Encounter: Payer: Self-pay | Admitting: Obstetrics & Gynecology

## 2020-09-01 ENCOUNTER — Other Ambulatory Visit: Payer: Self-pay

## 2020-09-01 VITALS — BP 112/68 | HR 64 | Resp 16 | Ht 58.75 in | Wt 100.0 lb

## 2020-09-01 DIAGNOSIS — Z01419 Encounter for gynecological examination (general) (routine) without abnormal findings: Secondary | ICD-10-CM

## 2020-09-01 DIAGNOSIS — N952 Postmenopausal atrophic vaginitis: Secondary | ICD-10-CM | POA: Diagnosis not present

## 2020-09-01 DIAGNOSIS — M818 Other osteoporosis without current pathological fracture: Secondary | ICD-10-CM | POA: Diagnosis not present

## 2020-09-01 MED ORDER — ESTRADIOL 0.1 MG/GM VA CREA: TOPICAL_CREAM | VAGINAL | 3 refills | Status: DC

## 2020-09-13 ENCOUNTER — Encounter: Payer: Self-pay | Admitting: Obstetrics & Gynecology

## 2020-09-19 DIAGNOSIS — J3081 Allergic rhinitis due to animal (cat) (dog) hair and dander: Secondary | ICD-10-CM | POA: Diagnosis not present

## 2020-09-19 DIAGNOSIS — J3089 Other allergic rhinitis: Secondary | ICD-10-CM | POA: Diagnosis not present

## 2020-09-19 DIAGNOSIS — J301 Allergic rhinitis due to pollen: Secondary | ICD-10-CM | POA: Diagnosis not present

## 2020-09-26 DIAGNOSIS — J3089 Other allergic rhinitis: Secondary | ICD-10-CM | POA: Diagnosis not present

## 2020-10-03 DIAGNOSIS — J3089 Other allergic rhinitis: Secondary | ICD-10-CM | POA: Diagnosis not present

## 2020-10-10 DIAGNOSIS — J3089 Other allergic rhinitis: Secondary | ICD-10-CM | POA: Diagnosis not present

## 2020-10-12 DIAGNOSIS — Z20822 Contact with and (suspected) exposure to covid-19: Secondary | ICD-10-CM | POA: Diagnosis not present

## 2020-10-17 DIAGNOSIS — J301 Allergic rhinitis due to pollen: Secondary | ICD-10-CM | POA: Diagnosis not present

## 2020-10-17 DIAGNOSIS — J3089 Other allergic rhinitis: Secondary | ICD-10-CM | POA: Diagnosis not present

## 2020-10-17 DIAGNOSIS — J3081 Allergic rhinitis due to animal (cat) (dog) hair and dander: Secondary | ICD-10-CM | POA: Diagnosis not present

## 2020-10-21 DIAGNOSIS — M1811 Unilateral primary osteoarthritis of first carpometacarpal joint, right hand: Secondary | ICD-10-CM | POA: Diagnosis not present

## 2020-10-21 DIAGNOSIS — M79641 Pain in right hand: Secondary | ICD-10-CM | POA: Diagnosis not present

## 2020-10-21 DIAGNOSIS — G5601 Carpal tunnel syndrome, right upper limb: Secondary | ICD-10-CM | POA: Diagnosis not present

## 2020-10-27 DIAGNOSIS — J3089 Other allergic rhinitis: Secondary | ICD-10-CM | POA: Diagnosis not present

## 2020-10-27 DIAGNOSIS — J3081 Allergic rhinitis due to animal (cat) (dog) hair and dander: Secondary | ICD-10-CM | POA: Diagnosis not present

## 2020-10-27 DIAGNOSIS — J301 Allergic rhinitis due to pollen: Secondary | ICD-10-CM | POA: Diagnosis not present

## 2020-10-27 DIAGNOSIS — K219 Gastro-esophageal reflux disease without esophagitis: Secondary | ICD-10-CM | POA: Diagnosis not present

## 2020-11-22 DIAGNOSIS — J301 Allergic rhinitis due to pollen: Secondary | ICD-10-CM | POA: Diagnosis not present

## 2020-11-22 DIAGNOSIS — J3081 Allergic rhinitis due to animal (cat) (dog) hair and dander: Secondary | ICD-10-CM | POA: Diagnosis not present

## 2020-11-22 DIAGNOSIS — J3089 Other allergic rhinitis: Secondary | ICD-10-CM | POA: Diagnosis not present

## 2020-11-22 DIAGNOSIS — M7051 Other bursitis of knee, right knee: Secondary | ICD-10-CM | POA: Diagnosis not present

## 2020-11-22 DIAGNOSIS — M1711 Unilateral primary osteoarthritis, right knee: Secondary | ICD-10-CM | POA: Diagnosis not present

## 2020-12-21 DIAGNOSIS — J3089 Other allergic rhinitis: Secondary | ICD-10-CM | POA: Diagnosis not present

## 2020-12-21 DIAGNOSIS — J3081 Allergic rhinitis due to animal (cat) (dog) hair and dander: Secondary | ICD-10-CM | POA: Diagnosis not present

## 2020-12-21 DIAGNOSIS — J301 Allergic rhinitis due to pollen: Secondary | ICD-10-CM | POA: Diagnosis not present

## 2020-12-28 DIAGNOSIS — E559 Vitamin D deficiency, unspecified: Secondary | ICD-10-CM | POA: Diagnosis not present

## 2020-12-28 DIAGNOSIS — Z23 Encounter for immunization: Secondary | ICD-10-CM | POA: Diagnosis not present

## 2020-12-28 DIAGNOSIS — N3010 Interstitial cystitis (chronic) without hematuria: Secondary | ICD-10-CM | POA: Diagnosis not present

## 2020-12-28 DIAGNOSIS — F411 Generalized anxiety disorder: Secondary | ICD-10-CM | POA: Diagnosis not present

## 2020-12-28 DIAGNOSIS — Z0000 Encounter for general adult medical examination without abnormal findings: Secondary | ICD-10-CM | POA: Diagnosis not present

## 2020-12-28 DIAGNOSIS — N952 Postmenopausal atrophic vaginitis: Secondary | ICD-10-CM | POA: Diagnosis not present

## 2020-12-28 DIAGNOSIS — M810 Age-related osteoporosis without current pathological fracture: Secondary | ICD-10-CM | POA: Diagnosis not present

## 2020-12-28 DIAGNOSIS — E039 Hypothyroidism, unspecified: Secondary | ICD-10-CM | POA: Diagnosis not present

## 2020-12-28 DIAGNOSIS — J309 Allergic rhinitis, unspecified: Secondary | ICD-10-CM | POA: Diagnosis not present

## 2020-12-28 DIAGNOSIS — K219 Gastro-esophageal reflux disease without esophagitis: Secondary | ICD-10-CM | POA: Diagnosis not present

## 2020-12-28 DIAGNOSIS — G4700 Insomnia, unspecified: Secondary | ICD-10-CM | POA: Diagnosis not present

## 2020-12-28 DIAGNOSIS — Z79899 Other long term (current) drug therapy: Secondary | ICD-10-CM | POA: Diagnosis not present

## 2021-01-04 DIAGNOSIS — J3089 Other allergic rhinitis: Secondary | ICD-10-CM | POA: Diagnosis not present

## 2021-01-04 DIAGNOSIS — J301 Allergic rhinitis due to pollen: Secondary | ICD-10-CM | POA: Diagnosis not present

## 2021-01-04 DIAGNOSIS — J3081 Allergic rhinitis due to animal (cat) (dog) hair and dander: Secondary | ICD-10-CM | POA: Diagnosis not present

## 2021-01-12 DIAGNOSIS — Z86010 Personal history of colonic polyps: Secondary | ICD-10-CM | POA: Diagnosis not present

## 2021-01-12 DIAGNOSIS — K219 Gastro-esophageal reflux disease without esophagitis: Secondary | ICD-10-CM | POA: Diagnosis not present

## 2021-01-12 DIAGNOSIS — R490 Dysphonia: Secondary | ICD-10-CM | POA: Diagnosis not present

## 2021-01-16 DIAGNOSIS — M1711 Unilateral primary osteoarthritis, right knee: Secondary | ICD-10-CM | POA: Diagnosis not present

## 2021-01-16 DIAGNOSIS — J301 Allergic rhinitis due to pollen: Secondary | ICD-10-CM | POA: Diagnosis not present

## 2021-01-16 DIAGNOSIS — J3081 Allergic rhinitis due to animal (cat) (dog) hair and dander: Secondary | ICD-10-CM | POA: Diagnosis not present

## 2021-01-16 DIAGNOSIS — J3089 Other allergic rhinitis: Secondary | ICD-10-CM | POA: Diagnosis not present

## 2021-01-18 DIAGNOSIS — K2970 Gastritis, unspecified, without bleeding: Secondary | ICD-10-CM | POA: Diagnosis not present

## 2021-01-18 DIAGNOSIS — K219 Gastro-esophageal reflux disease without esophagitis: Secondary | ICD-10-CM | POA: Diagnosis not present

## 2021-01-18 DIAGNOSIS — K317 Polyp of stomach and duodenum: Secondary | ICD-10-CM | POA: Diagnosis not present

## 2021-01-26 DIAGNOSIS — J301 Allergic rhinitis due to pollen: Secondary | ICD-10-CM | POA: Diagnosis not present

## 2021-01-26 DIAGNOSIS — J3081 Allergic rhinitis due to animal (cat) (dog) hair and dander: Secondary | ICD-10-CM | POA: Diagnosis not present

## 2021-01-26 DIAGNOSIS — M25569 Pain in unspecified knee: Secondary | ICD-10-CM | POA: Diagnosis not present

## 2021-01-26 DIAGNOSIS — J3089 Other allergic rhinitis: Secondary | ICD-10-CM | POA: Diagnosis not present

## 2021-01-31 DIAGNOSIS — N3010 Interstitial cystitis (chronic) without hematuria: Secondary | ICD-10-CM | POA: Diagnosis not present

## 2021-01-31 DIAGNOSIS — R3982 Chronic bladder pain: Secondary | ICD-10-CM | POA: Diagnosis not present

## 2021-02-06 DIAGNOSIS — M1711 Unilateral primary osteoarthritis, right knee: Secondary | ICD-10-CM | POA: Diagnosis not present

## 2021-02-10 DIAGNOSIS — J3089 Other allergic rhinitis: Secondary | ICD-10-CM | POA: Diagnosis not present

## 2021-02-10 DIAGNOSIS — J301 Allergic rhinitis due to pollen: Secondary | ICD-10-CM | POA: Diagnosis not present

## 2021-02-10 DIAGNOSIS — J3081 Allergic rhinitis due to animal (cat) (dog) hair and dander: Secondary | ICD-10-CM | POA: Diagnosis not present

## 2021-02-22 DIAGNOSIS — M1711 Unilateral primary osteoarthritis, right knee: Secondary | ICD-10-CM | POA: Diagnosis not present

## 2021-02-24 DIAGNOSIS — J3081 Allergic rhinitis due to animal (cat) (dog) hair and dander: Secondary | ICD-10-CM | POA: Diagnosis not present

## 2021-02-24 DIAGNOSIS — J301 Allergic rhinitis due to pollen: Secondary | ICD-10-CM | POA: Diagnosis not present

## 2021-02-24 DIAGNOSIS — J3089 Other allergic rhinitis: Secondary | ICD-10-CM | POA: Diagnosis not present

## 2021-03-09 DIAGNOSIS — J301 Allergic rhinitis due to pollen: Secondary | ICD-10-CM | POA: Diagnosis not present

## 2021-03-09 DIAGNOSIS — J3089 Other allergic rhinitis: Secondary | ICD-10-CM | POA: Diagnosis not present

## 2021-03-09 DIAGNOSIS — J3081 Allergic rhinitis due to animal (cat) (dog) hair and dander: Secondary | ICD-10-CM | POA: Diagnosis not present

## 2021-03-20 DIAGNOSIS — M7051 Other bursitis of knee, right knee: Secondary | ICD-10-CM | POA: Diagnosis not present

## 2021-03-20 DIAGNOSIS — M1711 Unilateral primary osteoarthritis, right knee: Secondary | ICD-10-CM | POA: Diagnosis not present

## 2021-03-22 DIAGNOSIS — J3089 Other allergic rhinitis: Secondary | ICD-10-CM | POA: Diagnosis not present

## 2021-03-22 DIAGNOSIS — J3081 Allergic rhinitis due to animal (cat) (dog) hair and dander: Secondary | ICD-10-CM | POA: Diagnosis not present

## 2021-03-22 DIAGNOSIS — J301 Allergic rhinitis due to pollen: Secondary | ICD-10-CM | POA: Diagnosis not present

## 2021-04-04 DIAGNOSIS — M1711 Unilateral primary osteoarthritis, right knee: Secondary | ICD-10-CM | POA: Diagnosis not present

## 2021-04-05 DIAGNOSIS — J3081 Allergic rhinitis due to animal (cat) (dog) hair and dander: Secondary | ICD-10-CM | POA: Diagnosis not present

## 2021-04-05 DIAGNOSIS — J301 Allergic rhinitis due to pollen: Secondary | ICD-10-CM | POA: Diagnosis not present

## 2021-04-05 DIAGNOSIS — J3089 Other allergic rhinitis: Secondary | ICD-10-CM | POA: Diagnosis not present

## 2021-04-11 DIAGNOSIS — M1711 Unilateral primary osteoarthritis, right knee: Secondary | ICD-10-CM | POA: Diagnosis not present

## 2021-04-18 DIAGNOSIS — M1711 Unilateral primary osteoarthritis, right knee: Secondary | ICD-10-CM | POA: Diagnosis not present

## 2021-04-19 DIAGNOSIS — J3081 Allergic rhinitis due to animal (cat) (dog) hair and dander: Secondary | ICD-10-CM | POA: Diagnosis not present

## 2021-04-19 DIAGNOSIS — J3089 Other allergic rhinitis: Secondary | ICD-10-CM | POA: Diagnosis not present

## 2021-04-19 DIAGNOSIS — J301 Allergic rhinitis due to pollen: Secondary | ICD-10-CM | POA: Diagnosis not present

## 2021-05-08 DIAGNOSIS — J301 Allergic rhinitis due to pollen: Secondary | ICD-10-CM | POA: Diagnosis not present

## 2021-05-08 DIAGNOSIS — J3089 Other allergic rhinitis: Secondary | ICD-10-CM | POA: Diagnosis not present

## 2021-05-08 DIAGNOSIS — J3081 Allergic rhinitis due to animal (cat) (dog) hair and dander: Secondary | ICD-10-CM | POA: Diagnosis not present

## 2021-05-24 DIAGNOSIS — J3089 Other allergic rhinitis: Secondary | ICD-10-CM | POA: Diagnosis not present

## 2021-05-24 DIAGNOSIS — J301 Allergic rhinitis due to pollen: Secondary | ICD-10-CM | POA: Diagnosis not present

## 2021-05-24 DIAGNOSIS — J3081 Allergic rhinitis due to animal (cat) (dog) hair and dander: Secondary | ICD-10-CM | POA: Diagnosis not present

## 2021-05-29 DIAGNOSIS — J3089 Other allergic rhinitis: Secondary | ICD-10-CM | POA: Diagnosis not present

## 2021-05-29 DIAGNOSIS — J301 Allergic rhinitis due to pollen: Secondary | ICD-10-CM | POA: Diagnosis not present

## 2021-05-29 DIAGNOSIS — J3081 Allergic rhinitis due to animal (cat) (dog) hair and dander: Secondary | ICD-10-CM | POA: Diagnosis not present

## 2021-05-30 DIAGNOSIS — M1711 Unilateral primary osteoarthritis, right knee: Secondary | ICD-10-CM | POA: Diagnosis not present

## 2021-06-05 DIAGNOSIS — J3089 Other allergic rhinitis: Secondary | ICD-10-CM | POA: Diagnosis not present

## 2021-06-05 DIAGNOSIS — J301 Allergic rhinitis due to pollen: Secondary | ICD-10-CM | POA: Diagnosis not present

## 2021-06-05 DIAGNOSIS — J3081 Allergic rhinitis due to animal (cat) (dog) hair and dander: Secondary | ICD-10-CM | POA: Diagnosis not present

## 2021-06-12 DIAGNOSIS — J3089 Other allergic rhinitis: Secondary | ICD-10-CM | POA: Diagnosis not present

## 2021-06-12 DIAGNOSIS — J301 Allergic rhinitis due to pollen: Secondary | ICD-10-CM | POA: Diagnosis not present

## 2021-06-12 DIAGNOSIS — J3081 Allergic rhinitis due to animal (cat) (dog) hair and dander: Secondary | ICD-10-CM | POA: Diagnosis not present

## 2021-06-19 DIAGNOSIS — J3081 Allergic rhinitis due to animal (cat) (dog) hair and dander: Secondary | ICD-10-CM | POA: Diagnosis not present

## 2021-06-19 DIAGNOSIS — J301 Allergic rhinitis due to pollen: Secondary | ICD-10-CM | POA: Diagnosis not present

## 2021-06-23 DIAGNOSIS — L814 Other melanin hyperpigmentation: Secondary | ICD-10-CM | POA: Diagnosis not present

## 2021-06-23 DIAGNOSIS — L821 Other seborrheic keratosis: Secondary | ICD-10-CM | POA: Diagnosis not present

## 2021-06-23 DIAGNOSIS — D2362 Other benign neoplasm of skin of left upper limb, including shoulder: Secondary | ICD-10-CM | POA: Diagnosis not present

## 2021-06-23 DIAGNOSIS — D2271 Melanocytic nevi of right lower limb, including hip: Secondary | ICD-10-CM | POA: Diagnosis not present

## 2021-06-26 DIAGNOSIS — J3089 Other allergic rhinitis: Secondary | ICD-10-CM | POA: Diagnosis not present

## 2021-06-26 DIAGNOSIS — J301 Allergic rhinitis due to pollen: Secondary | ICD-10-CM | POA: Diagnosis not present

## 2021-06-26 DIAGNOSIS — J3081 Allergic rhinitis due to animal (cat) (dog) hair and dander: Secondary | ICD-10-CM | POA: Diagnosis not present

## 2021-07-03 DIAGNOSIS — Z1231 Encounter for screening mammogram for malignant neoplasm of breast: Secondary | ICD-10-CM | POA: Diagnosis not present

## 2021-07-25 DIAGNOSIS — J3081 Allergic rhinitis due to animal (cat) (dog) hair and dander: Secondary | ICD-10-CM | POA: Diagnosis not present

## 2021-07-25 DIAGNOSIS — J3089 Other allergic rhinitis: Secondary | ICD-10-CM | POA: Diagnosis not present

## 2021-07-25 DIAGNOSIS — J301 Allergic rhinitis due to pollen: Secondary | ICD-10-CM | POA: Diagnosis not present

## 2021-08-01 DIAGNOSIS — J3081 Allergic rhinitis due to animal (cat) (dog) hair and dander: Secondary | ICD-10-CM | POA: Diagnosis not present

## 2021-08-01 DIAGNOSIS — J301 Allergic rhinitis due to pollen: Secondary | ICD-10-CM | POA: Diagnosis not present

## 2021-08-01 DIAGNOSIS — J3089 Other allergic rhinitis: Secondary | ICD-10-CM | POA: Diagnosis not present

## 2021-08-08 DIAGNOSIS — J3089 Other allergic rhinitis: Secondary | ICD-10-CM | POA: Diagnosis not present

## 2021-08-08 DIAGNOSIS — J3081 Allergic rhinitis due to animal (cat) (dog) hair and dander: Secondary | ICD-10-CM | POA: Diagnosis not present

## 2021-08-08 DIAGNOSIS — J301 Allergic rhinitis due to pollen: Secondary | ICD-10-CM | POA: Diagnosis not present

## 2021-08-16 DIAGNOSIS — J3081 Allergic rhinitis due to animal (cat) (dog) hair and dander: Secondary | ICD-10-CM | POA: Diagnosis not present

## 2021-08-16 DIAGNOSIS — J301 Allergic rhinitis due to pollen: Secondary | ICD-10-CM | POA: Diagnosis not present

## 2021-08-16 DIAGNOSIS — J3089 Other allergic rhinitis: Secondary | ICD-10-CM | POA: Diagnosis not present

## 2021-08-17 DIAGNOSIS — J3089 Other allergic rhinitis: Secondary | ICD-10-CM | POA: Diagnosis not present

## 2021-08-20 DIAGNOSIS — N390 Urinary tract infection, site not specified: Secondary | ICD-10-CM | POA: Diagnosis not present

## 2021-08-20 DIAGNOSIS — M545 Low back pain, unspecified: Secondary | ICD-10-CM | POA: Diagnosis not present

## 2021-08-22 DIAGNOSIS — M1711 Unilateral primary osteoarthritis, right knee: Secondary | ICD-10-CM | POA: Diagnosis not present

## 2021-08-29 DIAGNOSIS — J3089 Other allergic rhinitis: Secondary | ICD-10-CM | POA: Diagnosis not present

## 2021-08-29 DIAGNOSIS — J301 Allergic rhinitis due to pollen: Secondary | ICD-10-CM | POA: Diagnosis not present

## 2021-08-29 DIAGNOSIS — J3081 Allergic rhinitis due to animal (cat) (dog) hair and dander: Secondary | ICD-10-CM | POA: Diagnosis not present

## 2021-09-05 DIAGNOSIS — J3081 Allergic rhinitis due to animal (cat) (dog) hair and dander: Secondary | ICD-10-CM | POA: Diagnosis not present

## 2021-09-05 DIAGNOSIS — J3089 Other allergic rhinitis: Secondary | ICD-10-CM | POA: Diagnosis not present

## 2021-09-05 DIAGNOSIS — J301 Allergic rhinitis due to pollen: Secondary | ICD-10-CM | POA: Diagnosis not present

## 2021-09-11 DIAGNOSIS — Z23 Encounter for immunization: Secondary | ICD-10-CM | POA: Diagnosis not present

## 2021-09-12 DIAGNOSIS — J3089 Other allergic rhinitis: Secondary | ICD-10-CM | POA: Diagnosis not present

## 2021-09-12 DIAGNOSIS — J3081 Allergic rhinitis due to animal (cat) (dog) hair and dander: Secondary | ICD-10-CM | POA: Diagnosis not present

## 2021-09-12 DIAGNOSIS — J301 Allergic rhinitis due to pollen: Secondary | ICD-10-CM | POA: Diagnosis not present

## 2021-09-19 DIAGNOSIS — J3081 Allergic rhinitis due to animal (cat) (dog) hair and dander: Secondary | ICD-10-CM | POA: Diagnosis not present

## 2021-09-19 DIAGNOSIS — J3089 Other allergic rhinitis: Secondary | ICD-10-CM | POA: Diagnosis not present

## 2021-09-19 DIAGNOSIS — J301 Allergic rhinitis due to pollen: Secondary | ICD-10-CM | POA: Diagnosis not present

## 2021-09-26 DIAGNOSIS — J3089 Other allergic rhinitis: Secondary | ICD-10-CM | POA: Diagnosis not present

## 2021-09-26 DIAGNOSIS — J3081 Allergic rhinitis due to animal (cat) (dog) hair and dander: Secondary | ICD-10-CM | POA: Diagnosis not present

## 2021-09-26 DIAGNOSIS — J301 Allergic rhinitis due to pollen: Secondary | ICD-10-CM | POA: Diagnosis not present

## 2021-10-04 DIAGNOSIS — J3089 Other allergic rhinitis: Secondary | ICD-10-CM | POA: Diagnosis not present

## 2021-10-04 DIAGNOSIS — J301 Allergic rhinitis due to pollen: Secondary | ICD-10-CM | POA: Diagnosis not present

## 2021-10-04 DIAGNOSIS — J3081 Allergic rhinitis due to animal (cat) (dog) hair and dander: Secondary | ICD-10-CM | POA: Diagnosis not present

## 2021-10-09 ENCOUNTER — Ambulatory Visit (HOSPITAL_BASED_OUTPATIENT_CLINIC_OR_DEPARTMENT_OTHER): Payer: PPO | Admitting: Obstetrics & Gynecology

## 2021-10-17 DIAGNOSIS — J301 Allergic rhinitis due to pollen: Secondary | ICD-10-CM | POA: Diagnosis not present

## 2021-10-17 DIAGNOSIS — J3089 Other allergic rhinitis: Secondary | ICD-10-CM | POA: Diagnosis not present

## 2021-10-17 DIAGNOSIS — J3081 Allergic rhinitis due to animal (cat) (dog) hair and dander: Secondary | ICD-10-CM | POA: Diagnosis not present

## 2021-10-20 DIAGNOSIS — M1711 Unilateral primary osteoarthritis, right knee: Secondary | ICD-10-CM | POA: Diagnosis not present

## 2021-10-25 ENCOUNTER — Ambulatory Visit: Payer: Self-pay | Admitting: Student

## 2021-10-25 DIAGNOSIS — Z01818 Encounter for other preprocedural examination: Secondary | ICD-10-CM

## 2021-10-27 DIAGNOSIS — J3081 Allergic rhinitis due to animal (cat) (dog) hair and dander: Secondary | ICD-10-CM | POA: Diagnosis not present

## 2021-10-27 DIAGNOSIS — K2101 Gastro-esophageal reflux disease with esophagitis, with bleeding: Secondary | ICD-10-CM | POA: Diagnosis not present

## 2021-10-27 DIAGNOSIS — J3089 Other allergic rhinitis: Secondary | ICD-10-CM | POA: Diagnosis not present

## 2021-10-27 DIAGNOSIS — J301 Allergic rhinitis due to pollen: Secondary | ICD-10-CM | POA: Diagnosis not present

## 2021-10-31 DIAGNOSIS — J3081 Allergic rhinitis due to animal (cat) (dog) hair and dander: Secondary | ICD-10-CM | POA: Diagnosis not present

## 2021-10-31 DIAGNOSIS — J301 Allergic rhinitis due to pollen: Secondary | ICD-10-CM | POA: Diagnosis not present

## 2021-10-31 DIAGNOSIS — J3089 Other allergic rhinitis: Secondary | ICD-10-CM | POA: Diagnosis not present

## 2021-11-09 ENCOUNTER — Encounter (HOSPITAL_BASED_OUTPATIENT_CLINIC_OR_DEPARTMENT_OTHER): Payer: Self-pay | Admitting: Obstetrics & Gynecology

## 2021-11-09 ENCOUNTER — Other Ambulatory Visit (HOSPITAL_COMMUNITY)
Admission: RE | Admit: 2021-11-09 | Discharge: 2021-11-09 | Disposition: A | Discharge status: Home / Self Care | Payer: PPO | Source: Ambulatory Visit | Attending: Obstetrics & Gynecology | Admitting: Obstetrics & Gynecology

## 2021-11-09 ENCOUNTER — Other Ambulatory Visit: Payer: Self-pay

## 2021-11-09 ENCOUNTER — Ambulatory Visit (INDEPENDENT_AMBULATORY_CARE_PROVIDER_SITE_OTHER): Payer: PPO | Admitting: Obstetrics & Gynecology

## 2021-11-09 VITALS — BP 117/72 | HR 73 | Ht 58.5 in | Wt 102.2 lb

## 2021-11-09 DIAGNOSIS — Z124 Encounter for screening for malignant neoplasm of cervix: Secondary | ICD-10-CM

## 2021-11-09 DIAGNOSIS — M810 Age-related osteoporosis without current pathological fracture: Secondary | ICD-10-CM | POA: Diagnosis not present

## 2021-11-09 DIAGNOSIS — Z01419 Encounter for gynecological examination (general) (routine) without abnormal findings: Secondary | ICD-10-CM

## 2021-11-09 DIAGNOSIS — N952 Postmenopausal atrophic vaginitis: Secondary | ICD-10-CM

## 2021-11-09 DIAGNOSIS — F419 Anxiety disorder, unspecified: Secondary | ICD-10-CM | POA: Diagnosis not present

## 2021-11-09 MED ORDER — ESTRADIOL 0.1 MG/GM VA CREA: TOPICAL_CREAM | VAGINAL | 3 refills | Status: DC

## 2021-11-09 NOTE — Patient Instructions (Signed)
Prevnar 20 Then 6-12 months later pneumovax

## 2021-11-09 NOTE — Progress Notes (Signed)
65 y.o. G2P2 Married White or Caucasian female here for breast and pelvic exam.  I am also following her for osteoporosis.  Denies vaginal bleeding.  Uses vaginal estrogen cream and this is working well.   Having knee replacement on January 12.    Patient's last menstrual period was 11/19/1989.          Sexually active: Yes.    H/O STD:  no  Health Maintenance: PCP:  Dr. Mayra Neer.  Last wellness appt was 12/2020.  Did blood work at that appt:  yes Vaccines are up to date:  she is unsure about the pneumonia Colonoscopy:  10/04/2016 MMG:  06/27/2020 Negative BMD:  06/27/2020 Osteoporosis Last pap smear:  06/22/2019 Negative.   H/o abnormal pap smear:  h/o conization in her 30's    reports that she has never smoked. She has never used smokeless tobacco. She reports current alcohol use of about 10.0 standard drinks per week. She reports that she does not use drugs.  Past Medical History:  Diagnosis Date   Arthritis    in knee   Heart palpitations    Due to anxiety    Hypothyroidism    IC (interstitial cystitis)    Myofascial pain dysfunction syndrome    Right side   Osteoporosis    Stomach ulcer    Thyroid disease     Past Surgical History:  Procedure Laterality Date   CERVICAL CONE BIOPSY     around age 28   CERVIX LESION DESTRUCTION     CESAREAN [place] Right 07/09/2017   Leg    ROOT CANAL  11/13   3 on 2 teeth   ROOT CANAL  04/16/14 and 04/27/14   x2   TONSILLECTOMY      Current Outpatient Medications  Medication Sig Dispense Refill   Dexlansoprazole (DEXILANT PO) Take by mouth daily.     dexmethylphenidate (FOCALIN) 2.5 MG tablet Take 2.5 mg by mouth 2 (two) times daily.     EPIPEN 2-PAK 0.3 MG/0.3ML SOAJ injection INJECT AS DIRECTED AS NEEDED  1   FLUoxetine (PROZAC) 20 MG tablet Take 20 mg by mouth daily.     levothyroxine (SYNTHROID) 50 MCG tablet Take 1 tablet by mouth daily.      OLANZapine (ZYPREXA) 2.5 MG tablet Take by mouth at bedtime. Takes 1/4     VITAMIN D PO Take 4,000 Int'l Units by mouth.     estradiol (ESTRACE) 0.1 MG/GM vaginal cream 1 gram pv twice weekly. 42.5 g 3   No current facility-administered medications for this visit.    Family History  Problem Relation Age of Onset   Alzheimer's disease Mother    Liver cancer Father    Cancer Other     Review of Systems  All other systems reviewed and are negative.  Exam:   BP 117/72 (BP Location: Left Arm, Patient Position: Sitting, Cuff Size: Normal)    Pulse 73    Ht 4' 10.5" (1.486 m)    Wt 102 lb 3.2 oz (46.4 kg)    LMP 11/19/1989    BMI 21.00 kg/m   Height: 4' 10.5" (148.6 cm)  General appearance: alert, cooperative and appears stated age Breasts: normal appearance, no masses or tenderness Abdomen: soft, non-tender; bowel sounds normal; no masses,  no organomegaly Lymph nodes: Cervical, supraclavicular, and axillary nodes normal.  No abnormal inguinal nodes palpated Neurologic:  Grossly normal  Pelvic: External genitalia:  no lesions              Urethra:  normal appearing urethra with no masses, tenderness or lesions              Bartholins and Skenes: normal                 Vagina: normal appearing vagina with atrophic changes and no discharge, no lesions              Cervix: no lesions              Pap taken: Yes.   Bimanual Exam:  Uterus:  normal size, contour, position, consistency, mobility, non-tender              Adnexa: normal adnexa and no mass, fullness, tenderness               Rectovaginal: Confirms               Anus:  normal sphincter tone, no lesions  Chaperone, Octaviano Batty, CMA, was present for exam.  Assessment/Plan: 1. Encntr for gyn exam (general) (routine) w/o abn findings - pap obtained today - MMG called for today - Pt aware colonoscopy is due.  She will plan this in 2023. - vaccines updated/reviewed - lab work done with Dr. Brigitte Pulse - plan BMD in 2024  2.  Cervical cancer screening - Cytology - PAP( [facility]) - PR OBTAINING SCREEN PAP SMEAR  3. Vaginal atrophy - RF for estradiol cream.    4. Age-related osteoporosis without current pathological fracture - plan bmd in 2024  5. Anxiety

## 2021-11-10 ENCOUNTER — Encounter (HOSPITAL_BASED_OUTPATIENT_CLINIC_OR_DEPARTMENT_OTHER): Payer: Self-pay | Admitting: *Deleted

## 2021-11-14 ENCOUNTER — Ambulatory Visit: Payer: Self-pay | Admitting: Student

## 2021-11-14 NOTE — H&P (Signed)
TOTAL KNEE ADMISSION H&P  Patient is being admitted for right total knee arthroplasty.  Subjective:  Chief Complaint:right knee pain.  HPI: Kristin Andersen, 65 y.o. female, has a history of pain and functional disability in the right knee due to arthritis and has failed non-surgical conservative treatments for greater than 12 weeks to includeNSAID's and/or analgesics, corticosteriod injections, and activity modification.  Onset of symptoms was gradual, starting 2 years ago with rapidlly worsening course since that time. The patient noted no past surgery on the right knee(s).  Patient currently rates pain in the right knee(s) at 8 out of 10 with activity. Patient has worsening of pain with activity and weight bearing, pain that interferes with activities of daily living, and pain with passive range of motion.  Patient has evidence of subchondral cysts, subchondral sclerosis, and joint space narrowing by imaging studies. There is no active infection.  Patient Active Problem List   Diagnosis Date Noted   Tear of right hamstring 05/30/2017   Abnormal leg finding 07/07/2015   Myofascial pain dysfunction syndrome 11/21/2012   Piriformis syndrome of right side 06/20/2012   Nonallopathic lesion of lumbosacral region 06/20/2012   Plantar fasciitis 04/17/2011   Acquired hallux rigidus 04/17/2011   Past Medical History:  Diagnosis Date   Arthritis    in knee   Heart palpitations    Due to anxiety    Hypothyroidism    IC (interstitial cystitis)    Myofascial pain dysfunction syndrome    Right side   Osteoporosis    Stomach ulcer    Thyroid disease     Past Surgical History:  Procedure Laterality Date   CERVICAL CONE BIOPSY     around age 74   CERVIX LESION [place] Right 07/09/2017   Leg    ROOT CANAL  11/13   3 on 2 teeth   ROOT CANAL  04/16/14 and 04/27/14   x2   TONSILLECTOMY       Current Outpatient Medications  Medication Sig Dispense Refill Last Dose   Dexlansoprazole (DEXILANT PO) Take by mouth daily.      dexmethylphenidate (FOCALIN) 2.5 MG tablet Take 2.5 mg by mouth 2 (two) times daily.      EPIPEN 2-PAK 0.3 MG/0.3ML SOAJ injection INJECT AS DIRECTED AS NEEDED  1    estradiol (ESTRACE) 0.1 MG/GM vaginal cream 1 gram pv twice weekly. 42.5 g 3    FLUoxetine (PROZAC) 20 MG tablet Take 20 mg by mouth daily.      levothyroxine (SYNTHROID) 50 MCG tablet Take 1 tablet by mouth daily.      OLANZapine (ZYPREXA) 2.5 MG tablet Take by mouth at bedtime. Takes 1/4      VITAMIN D PO Take 4,000 Int'l Units by mouth.      No current facility-administered medications for this visit.   Allergies  Allergen Reactions   Dust Mite Extract    Mold Extract [Trichophyton]    Amoxicillin     hives   Gabapentin     Dizzy, nausea   Penicillin G Hives   Robaxin [Methocarbamol]     Dizzy, nausea   Levofloxacin Hives, Itching and Rash    Social History   Tobacco Use   Smoking status: Never   Smokeless tobacco: Never  Substance Use Topics   Alcohol use: Yes    Alcohol/week: 10.0 standard  drinks    Types: 10 Glasses of wine per week    Family History  Problem Relation Age of Onset   Alzheimer's disease Mother    Liver cancer Father    Cancer Other      Review of Systems  Musculoskeletal:  Positive for arthralgias.  All other systems reviewed and are negative.  Objective:  Physical Exam HENT:     Head: Normocephalic.  Eyes:     Pupils: Pupils are equal, round, and reactive to light.  Cardiovascular:     Rate and Rhythm: Normal rate.  Pulmonary:     Effort: Pulmonary effort is normal.  Abdominal:     Palpations: Abdomen is soft.  Genitourinary:    Comments: Deferred Musculoskeletal:        General: Tenderness present.     Cervical back: Normal range of motion.  Skin:    General: Skin is warm.  Neurological:     Mental Status: She is alert and  oriented to person, place, and time.  Psychiatric:        Behavior: Behavior normal.    Vital signs in last 24 hours: @VSRANGES @  Labs:   Estimated body mass index is 21 kg/m as calculated from the following:   Height as of 11/09/21: 4' 10.5" (1.486 m).   Weight as of 11/09/21: 46.4 kg.   Imaging Review Plain radiographs demonstrate severe degenerative joint disease of the right knee(s). The bone quality appears to be adequate for age and reported activity level.      Assessment/Plan:  End stage arthritis, right knee   The patient history, physical examination, clinical judgment of the provider and imaging studies are consistent with end stage degenerative joint disease of the right knee(s) and total knee arthroplasty is deemed medically necessary. The treatment options including medical management, injection therapy arthroscopy and arthroplasty were discussed at length. The risks and benefits of total knee arthroplasty were presented and reviewed. The risks due to aseptic loosening, infection, stiffness, patella tracking problems, thromboembolic complications and other imponderables were discussed. The patient acknowledged the explanation, agreed to proceed with the plan and consent was signed. Patient is being admitted for inpatient treatment for surgery, pain control, PT, OT, prophylactic antibiotics, VTE prophylaxis, progressive ambulation and ADL's and discharge planning. The patient is planning to be discharged  home     Patient's anticipated LOS is less than 2 midnights, meeting these requirements: - Lives within 1 hour of care - Has a competent adult at home to recover with post-op recover - NO history of  - Chronic pain requiring opiods  - Diabetes  - Coronary Artery Disease  - Heart failure  - Heart attack  - Stroke  - DVT/VTE  - Cardiac arrhythmia  - Respiratory Failure/COPD  - Renal failure  - Anemia  - Advanced Liver disease

## 2021-11-14 NOTE — H&P (View-Only) (Signed)
TOTAL KNEE ADMISSION H&P  Patient is being admitted for right total knee arthroplasty.  Subjective:  Chief Complaint:right knee pain.  HPI: Kristin Andersen, 65 y.o. female, has a history of pain and functional disability in the right knee due to arthritis and has failed non-surgical conservative treatments for greater than 12 weeks to includeNSAID's and/or analgesics, corticosteriod injections, and activity modification.  Onset of symptoms was gradual, starting 2 years ago with rapidlly worsening course since that time. The patient noted no past surgery on the right knee(s).  Patient currently rates pain in the right knee(s) at 8 out of 10 with activity. Patient has worsening of pain with activity and weight bearing, pain that interferes with activities of daily living, and pain with passive range of motion.  Patient has evidence of subchondral cysts, subchondral sclerosis, and joint space narrowing by imaging studies. There is no active infection.  Patient Active Problem List   Diagnosis Date Noted   Tear of right hamstring 05/30/2017   Abnormal leg finding 07/07/2015   Myofascial pain dysfunction syndrome 11/21/2012   Piriformis syndrome of right side 06/20/2012   Nonallopathic lesion of lumbosacral region 06/20/2012   Plantar fasciitis 04/17/2011   Acquired hallux rigidus 04/17/2011   Past Medical History:  Diagnosis Date   Arthritis    in knee   Heart palpitations    Due to anxiety    Hypothyroidism    IC (interstitial cystitis)    Myofascial pain dysfunction syndrome    Right side   Osteoporosis    Stomach ulcer    Thyroid disease     Past Surgical History:  Procedure Laterality Date   CERVICAL CONE BIOPSY     around age 80   CERVIX LESION [place] Right 07/09/2017   Leg    ROOT CANAL  11/13   3 on 2 teeth   ROOT CANAL  04/16/14 and 04/27/14   x2   TONSILLECTOMY       Current Outpatient Medications  Medication Sig Dispense Refill Last Dose   Dexlansoprazole (DEXILANT PO) Take by mouth daily.      dexmethylphenidate (FOCALIN) 2.5 MG tablet Take 2.5 mg by mouth 2 (two) times daily.      EPIPEN 2-PAK 0.3 MG/0.3ML SOAJ injection INJECT AS DIRECTED AS NEEDED  1    estradiol (ESTRACE) 0.1 MG/GM vaginal cream 1 gram pv twice weekly. 42.5 g 3    FLUoxetine (PROZAC) 20 MG tablet Take 20 mg by mouth daily.      levothyroxine (SYNTHROID) 50 MCG tablet Take 1 tablet by mouth daily.      OLANZapine (ZYPREXA) 2.5 MG tablet Take by mouth at bedtime. Takes 1/4      VITAMIN D PO Take 4,000 Int'l Units by mouth.      No current facility-administered medications for this visit.   Allergies  Allergen Reactions   Dust Mite Extract    Mold Extract [Trichophyton]    Amoxicillin     hives   Gabapentin     Dizzy, nausea   Penicillin G Hives   Robaxin [Methocarbamol]     Dizzy, nausea   Levofloxacin Hives, Itching and Rash    Social History   Tobacco Use   Smoking status: Never   Smokeless tobacco: Never  Substance Use Topics   Alcohol use: Yes    Alcohol/week: 10.0 standard  drinks    Types: 10 Glasses of wine per week    Family History  Problem Relation Age of Onset   Alzheimer's disease Mother    Liver cancer Father    Cancer Other      Review of Systems  Musculoskeletal:  Positive for arthralgias.  All other systems reviewed and are negative.  Objective:  Physical Exam HENT:     Head: Normocephalic.  Eyes:     Pupils: Pupils are equal, round, and reactive to light.  Cardiovascular:     Rate and Rhythm: Normal rate.  Pulmonary:     Effort: Pulmonary effort is normal.  Abdominal:     Palpations: Abdomen is soft.  Genitourinary:    Comments: Deferred Musculoskeletal:        General: Tenderness present.     Cervical back: Normal range of motion.  Skin:    General: Skin is warm.  Neurological:     Mental Status: She is alert and  oriented to person, place, and time.  Psychiatric:        Behavior: Behavior normal.    Vital signs in last 24 hours: @VSRANGES @  Labs:   Estimated body mass index is 21 kg/m as calculated from the following:   Height as of 11/09/21: 4' 10.5" (1.486 m).   Weight as of 11/09/21: 46.4 kg.   Imaging Review Plain radiographs demonstrate severe degenerative joint disease of the right knee(s). The bone quality appears to be adequate for age and reported activity level.      Assessment/Plan:  End stage arthritis, right knee   The patient history, physical examination, clinical judgment of the provider and imaging studies are consistent with end stage degenerative joint disease of the right knee(s) and total knee arthroplasty is deemed medically necessary. The treatment options including medical management, injection therapy arthroscopy and arthroplasty were discussed at length. The risks and benefits of total knee arthroplasty were presented and reviewed. The risks due to aseptic loosening, infection, stiffness, patella tracking problems, thromboembolic complications and other imponderables were discussed. The patient acknowledged the explanation, agreed to proceed with the plan and consent was signed. Patient is being admitted for inpatient treatment for surgery, pain control, PT, OT, prophylactic antibiotics, VTE prophylaxis, progressive ambulation and ADL's and discharge planning. The patient is planning to be discharged  home     Patient's anticipated LOS is less than 2 midnights, meeting these requirements: - Lives within 1 hour of care - Has a competent adult at home to recover with post-op recover - NO history of  - Chronic pain requiring opiods  - Diabetes  - Coronary Artery Disease  - Heart failure  - Heart attack  - Stroke  - DVT/VTE  - Cardiac arrhythmia  - Respiratory Failure/COPD  - Renal failure  - Anemia  - Advanced Liver disease

## 2021-11-15 LAB — CYTOLOGY - PAP: Diagnosis: NEGATIVE

## 2021-11-17 DIAGNOSIS — J301 Allergic rhinitis due to pollen: Secondary | ICD-10-CM | POA: Diagnosis not present

## 2021-11-17 DIAGNOSIS — J3081 Allergic rhinitis due to animal (cat) (dog) hair and dander: Secondary | ICD-10-CM | POA: Diagnosis not present

## 2021-11-17 DIAGNOSIS — J3089 Other allergic rhinitis: Secondary | ICD-10-CM | POA: Diagnosis not present

## 2021-11-21 NOTE — Patient Instructions (Addendum)
DUE TO COVID-19 ONLY ONE VISITOR IS ALLOWED TO COME WITH YOU AND STAY IN THE WAITING ROOM ONLY DURING PRE OP AND PROCEDURE.   **NO VISITORS ARE ALLOWED IN THE SHORT STAY AREA OR RECOVERY ROOM!!**       Your procedure is scheduled on: 11/30/21   Report to [facility] Main Entrance    Report to admitting at 5:15 AM   Call this number if you have problems the morning of surgery [phone]   Do not eat food :After Midnight.   May have liquids until 4:30 AM day of surgery  CLEAR LIQUID DIET  Foods Allowed                                                                     Foods Excluded  Water, Black Coffee and tea (no milk or creamer)           liquids that you cannot  Plain Jell-O in any flavor  (No red)                                     see through such as: Fruit ices (not with fruit pulp)                                           milk, soups, orange juice              Iced Popsicles (No red)                                              All solid food                                   Apple juices Sports drinks like Gatorade (No red) Lightly seasoned clear broth or consume(fat free) Sugar     The day of surgery:  Drink ONE (1) Pre-Surgery Clear Ensure by 4:30 am the morning of surgery. Drink in one sitting. Do not sip.  This drink was given to you during your hospital  pre-op appointment visit. Nothing else to drink after completing the  Pre-Surgery Clear Ensure.          If you have questions, please contact your surgeons office.     Oral Hygiene is also important to reduce your risk of infection.                                    Remember - BRUSH YOUR TEETH THE MORNING OF SURGERY WITH YOUR REGULAR TOOTHPASTE   Take these medicines the morning of surgery with A SIP OF WATER: Dexilant, Prozac, Synthroid                              You  may not have any metal on your body including hair pins, jewelry, and body piercing             Do not wear make-up,  lotions, powders, perfumes, or deodorant  Do not wear nail polish including gel and S&S, artificial/acrylic nails, or any other type of covering on natural nails including finger and toenails. If you have artificial nails, gel coating, etc. that needs to be removed by a nail salon please have this removed prior to surgery or surgery may need to be canceled/ delayed if the surgeon/ anesthesia feels like they are unable to be safely monitored.   Do not shave  48 hours prior to surgery.    Do not bring valuables to the hospital. [place].   Contacts, dentures or bridgework may not be worn into surgery.    Patients discharged on the day of surgery will not be allowed to drive home.   Special Instructions: Bring a copy of your healthcare power of attorney and living will documents         the day of surgery if you haven't scanned them before.              Please read over the following fact sheets you were given: IF YOU HAVE QUESTIONS ABOUT YOUR PRE-OP INSTRUCTIONS PLEASE CALL [place] - Preparing for Surgery Before surgery, you can play an important role.  Because skin is not sterile, your skin needs to be as free of germs as possible.  You can reduce the number of germs on your skin by washing with CHG (chlorahexidine gluconate) soap before surgery.  CHG is an antiseptic cleaner which kills germs and bonds with the skin to continue killing germs even after washing. Please DO NOT use if you have an allergy to CHG or antibacterial soaps.  If your skin becomes reddened/irritated stop using the CHG and inform your nurse when you arrive at Short Stay. Do not shave (including legs and underarms) for at least 48 hours prior to the first CHG shower.  You may shave your face/neck.  Please follow these instructions carefully:  1.  Shower with CHG Soap the night before surgery and the  morning of surgery.  2.  If you choose to wash  your hair, wash your hair first as usual with your normal  shampoo.  3.  After you shampoo, rinse your hair and body thoroughly to remove the shampoo.                             4.  Use CHG as you would any other liquid soap.  You can apply chg directly to the skin and wash.  Gently with a scrungie or clean washcloth.  5.  Apply the CHG Soap to your body ONLY FROM THE NECK DOWN.   Do   not use on face/ open                           Wound or open sores. Avoid contact with eyes, ears mouth and   genitals (private parts).                       Wash face,  Genitals (private parts) with your normal soap.  6.  Wash thoroughly, paying special attention to the area where your    surgery  will be performed.  7.  Thoroughly rinse your body with warm water from the neck down.  8.  DO NOT shower/wash with your normal soap after using and rinsing off the CHG Soap.                9.  Pat yourself dry with a clean towel.            10.  Wear clean pajamas.            11.  Place clean sheets on your bed the night of your first shower and do not  sleep with pets. Day of Surgery : Do not apply any lotions/deodorants the morning of surgery.  Please wear clean clothes to the hospital/surgery center.  FAILURE TO FOLLOW THESE INSTRUCTIONS MAY RESULT IN THE CANCELLATION OF YOUR SURGERY  PATIENT SIGNATURE_________________________________  NURSE SIGNATURE__________________________________  ________________________________________________________________________   Kristin Andersen  An incentive spirometer is a tool that can help keep your lungs clear and active. This tool measures how well you are filling your lungs with each breath. Taking long deep breaths may help reverse or decrease the chance of developing breathing (pulmonary) problems (especially infection) following: A long period of time when you are unable to move or be active. BEFORE THE PROCEDURE  If the spirometer includes an  indicator to show your best effort, your nurse or respiratory therapist will set it to a desired goal. If possible, sit up straight or lean slightly forward. Try not to slouch. Hold the incentive spirometer in an upright position. INSTRUCTIONS FOR USE  Sit on the edge of your bed if possible, or sit up as far as you can in bed or on a chair. Hold the incentive spirometer in an upright position. Breathe out normally. Place the mouthpiece in your mouth and seal your lips tightly around it. Breathe in slowly and as deeply as possible, raising the piston or the ball toward the top of the column. Hold your breath for 3-5 seconds or for as long as possible. Allow the piston or ball to fall to the bottom of the column. Remove the mouthpiece from your mouth and breathe out normally. Rest for a few seconds and repeat Steps 1 through 7 at least 10 times every 1-2 hours when you are awake. Take your time and take a few normal breaths between deep breaths. The spirometer may include an indicator to show your best effort. Use the indicator as a goal to work toward during each repetition. After each set of 10 deep breaths, practice coughing to be sure your lungs are clear. If you have an incision (the cut made at the time of surgery), support your incision when coughing by placing a pillow or rolled up towels firmly against it. Once you are able to get out of bed, walk around indoors and cough well. You may stop using the incentive spirometer when instructed by your caregiver.  RISKS AND COMPLICATIONS Take your time so you do not get dizzy or light-headed. If you are in pain, you may need to take or ask for pain medication before doing incentive spirometry. It is harder to take a deep breath if you are having pain. AFTER USE Rest and breathe slowly and easily. It can be helpful to keep track of a log of your progress. Your caregiver can provide you with a simple table to help with this. If  you are using the  spirometer at home, follow these instructions: [place] IF:  You are having difficultly using the spirometer. You have trouble using the spirometer as often as instructed. Your pain medication is not giving enough relief while using the spirometer. You develop fever of 100.5 F (38.1 C) or higher. SEEK IMMEDIATE MEDICAL CARE IF:  You cough up bloody sputum that had not been present before. You develop fever of 102 F (38.9 C) or greater. You develop worsening pain at or near the incision site. MAKE SURE YOU:  Understand these instructions. Will watch your condition. Will get help right away if you are not doing well or get worse. Document Released: 03/18/2007 Document Revised: 01/28/2012 Document Reviewed: 05/19/2007 Exit[employer] Patient Information 2014 [employer], [place].   ________________________________________________________________________  WHAT IS A BLOOD TRANSFUSION? Blood Transfusion Information  A transfusion is the replacement of blood or some of its parts. Blood is made up of multiple cells which provide different functions. Red blood cells carry oxygen and are used for blood loss replacement. White blood cells fight against infection. Platelets control bleeding. Plasma helps clot blood. Other blood products are available for specialized needs, such as hemophilia or other clotting disorders. BEFORE THE TRANSFUSION  Who gives blood for transfusions?  Healthy volunteers who are fully evaluated to make sure their blood is safe. This is blood bank blood. Transfusion therapy is the safest it has ever been in the practice of medicine. Before blood is taken from a donor, a complete history is taken to make sure that person has no history of diseases nor engages in risky social behavior (examples are intravenous drug use or sexual activity with multiple partners). The donor's travel history is screened to minimize risk of transmitting infections, such as malaria. The donated  blood is tested for signs of infectious diseases, such as HIV and hepatitis. The blood is then tested to be sure it is compatible with you in order to minimize the chance of a transfusion reaction. If you or a relative donates blood, this is often done in anticipation of surgery and is not appropriate for emergency situations. It takes many days to process the donated blood. RISKS AND COMPLICATIONS Although transfusion therapy is very safe and saves many lives, the main dangers of transfusion include:  Getting an infectious disease. Developing a transfusion reaction. This is an allergic reaction to something in the blood you were given. Every precaution is taken to prevent this. The decision to have a blood transfusion has been considered carefully by your caregiver before blood is given. Blood is not given unless the benefits outweigh the risks. AFTER THE TRANSFUSION Right after receiving a blood transfusion, you will usually feel much better and more energetic. This is especially true if your red blood cells have gotten low (anemic). The transfusion raises the level of the red blood cells which carry oxygen, and this usually causes an energy increase. The nurse administering the transfusion will monitor you carefully for complications. HOME CARE INSTRUCTIONS  No special instructions are needed after a transfusion. You may find your energy is better. Speak with your caregiver about any limitations on activity for underlying diseases you may have. SEEK MEDICAL CARE IF:  Your condition is not improving after your transfusion. You develop redness or irritation at the intravenous (IV) site. SEEK IMMEDIATE MEDICAL CARE IF:  Any of the following symptoms occur over the next 12 hours: Shaking chills. You have a temperature by mouth above 102 F (38.9 C), not  controlled by medicine. Chest, back, or muscle pain. People around you feel you are not acting correctly or are confused. Shortness of breath or  difficulty breathing. Dizziness and fainting. You get a rash or develop hives. You have a decrease in urine output. Your urine turns a dark color or changes to pink, red, or brown. Any of the following symptoms occur over the next 10 days: You have a temperature by mouth above 102 F (38.9 C), not controlled by medicine. Shortness of breath. Weakness after normal activity. The white part of the eye turns yellow (jaundice). You have a decrease in the amount of urine or are urinating less often. Your urine turns a dark color or changes to pink, red, or brown. Document Released: 11/02/2000 Document Revised: 01/28/2012 Document Reviewed: 06/21/2008 [facility] Patient Information 2014 [place], [place].  _______________________________________________________________________

## 2021-11-21 NOTE — Progress Notes (Addendum)
COVID swab appointment: n/a  COVID Vaccine Completed: yes x4 Date COVID Vaccine completed: 12/13/19, 01/04/20 Has received booster: 08/30/20, 10/27/20 COVID vaccine manufacturer: Pfizer      Date of COVID positive in last 90 days: no  PCP - Mayra Neer, MD Cardiologist - n/a  Medical Clearance by Mayra Neer 10/25/21  Chest x-ray - n/a EKG - n/a Stress Test - n/a ECHO - n/a Cardiac Cath - n/a Pacemaker/ICD device last checked: n/a Spinal Cord Stimulator: n/a  Sleep Study - n/a CPAP -   Fasting Blood Sugar - n/a Checks Blood Sugar _____ times a day  Blood Thinner Instructions: n/a Aspirin Instructions: Last Dose:  Activity level: Can go up a flight of stairs and perform activities of daily living without stopping and without symptoms of chest pain or shortness of breath.       Anesthesia review:   Patient denies shortness of breath, fever, cough and chest pain at PAT appointment   Patient verbalized understanding of instructions that were given to them at the PAT appointment. Patient was also instructed that they will need to review over the PAT instructions again at home before surgery.

## 2021-11-22 ENCOUNTER — Encounter (HOSPITAL_COMMUNITY)
Admission: RE | Admit: 2021-11-22 | Discharge: 2021-11-22 | Disposition: A | Discharge status: Home / Self Care | Payer: PPO | Source: Ambulatory Visit | Attending: Orthopedic Surgery | Admitting: Orthopedic Surgery

## 2021-11-22 ENCOUNTER — Other Ambulatory Visit: Payer: Self-pay

## 2021-11-22 ENCOUNTER — Encounter (HOSPITAL_COMMUNITY): Payer: Self-pay

## 2021-11-22 VITALS — BP 122/83 | HR 81 | Temp 98.4°F | Resp 14 | Ht 59.5 in | Wt 100.2 lb

## 2021-11-22 DIAGNOSIS — Z79899 Other long term (current) drug therapy: Secondary | ICD-10-CM | POA: Diagnosis not present

## 2021-11-22 DIAGNOSIS — Z01818 Encounter for other preprocedural examination: Secondary | ICD-10-CM

## 2021-11-22 DIAGNOSIS — Z01812 Encounter for preprocedural laboratory examination: Secondary | ICD-10-CM | POA: Diagnosis not present

## 2021-11-22 HISTORY — DX: Gastro-esophageal reflux disease without esophagitis: K21.9

## 2021-11-22 HISTORY — DX: Anxiety disorder, unspecified: F41.9

## 2021-11-22 LAB — SURGICAL PCR SCREEN
MRSA, PCR: NEGATIVE
Staphylococcus aureus: NEGATIVE

## 2021-11-22 LAB — PROTIME-INR
INR: 0.9 (ref 0.8–1.2)
Prothrombin Time: 12.2 seconds (ref 11.4–15.2)

## 2021-11-22 LAB — COMPREHENSIVE METABOLIC PANEL
ALT: 22 U/L (ref 0–44)
AST: 33 U/L (ref 15–41)
Albumin: 4.6 g/dL (ref 3.5–5.0)
Alkaline Phosphatase: 49 U/L (ref 38–126)
Anion gap: 9 (ref 5–15)
BUN: 19 mg/dL (ref 8–23)
CO2: 26 mmol/L (ref 22–32)
Calcium: 9.5 mg/dL (ref 8.9–10.3)
Chloride: 102 mmol/L (ref 98–111)
Creatinine, Ser: 0.59 mg/dL (ref 0.44–1.00)
GFR, Estimated: >60 mL/min (ref >60)
Glucose, Bld: 138 mg/dL — ABNORMAL HIGH (ref 70–99)
Potassium: 3.8 mmol/L (ref 3.5–5.1)
Sodium: 137 mmol/L (ref 135–145)
Total Bilirubin: 0.5 mg/dL (ref 0.3–1.2)
Total Protein: 8.2 g/dL — ABNORMAL HIGH (ref 6.5–8.1)

## 2021-11-22 LAB — CBC
HCT: 40.8 % (ref 36.0–46.0)
Hemoglobin: 13.8 g/dL (ref 12.0–15.0)
MCH: 33.5 pg (ref 26.0–34.0)
MCHC: 33.8 g/dL (ref 30.0–36.0)
MCV: 99 fL (ref 80.0–100.0)
Platelets: 295 10*3/uL (ref 150–400)
RBC: 4.12 MIL/uL (ref 3.87–5.11)
RDW: 12 % (ref 11.5–15.5)
WBC: 7.3 10*3/uL (ref 4.0–10.5)
nRBC: 0 % (ref 0.0–0.2)

## 2021-11-28 DIAGNOSIS — J3081 Allergic rhinitis due to animal (cat) (dog) hair and dander: Secondary | ICD-10-CM | POA: Diagnosis not present

## 2021-11-28 DIAGNOSIS — J3089 Other allergic rhinitis: Secondary | ICD-10-CM | POA: Diagnosis not present

## 2021-11-28 DIAGNOSIS — J301 Allergic rhinitis due to pollen: Secondary | ICD-10-CM | POA: Diagnosis not present

## 2021-11-29 ENCOUNTER — Ambulatory Visit (HOSPITAL_BASED_OUTPATIENT_CLINIC_OR_DEPARTMENT_OTHER): Payer: PPO | Admitting: Obstetrics & Gynecology

## 2021-11-29 ENCOUNTER — Encounter (HOSPITAL_COMMUNITY): Payer: Self-pay | Admitting: Orthopedic Surgery

## 2021-11-29 NOTE — Anesthesia Preprocedure Evaluation (Addendum)
Anesthesia Evaluation  Patient identified by MRN, date of birth, ID band Patient awake    Reviewed: Allergy & Precautions, NPO status , Patient's Chart, lab work & pertinent test results  Airway Mallampati: I  TM Distance: >3 FB Neck ROM: Full    Dental  (+) Teeth Intact, Dental Advisory Given   Pulmonary neg pulmonary ROS,    breath sounds clear to auscultation       Cardiovascular negative cardio ROS   Rhythm:Regular Rate:Normal     Neuro/Psych Anxiety    GI/Hepatic Neg liver ROS, PUD, GERD  ,  Endo/Other  Hypothyroidism   Renal/GU negative Renal ROS     Musculoskeletal  (+) Arthritis ,   Abdominal Normal abdominal exam  (+)   Peds  Hematology negative hematology ROS (+)   Anesthesia Other Findings   Reproductive/Obstetrics                            Anesthesia Physical Anesthesia Plan  ASA: 2  Anesthesia Plan: Spinal   Post-op Pain Management: Regional block   Induction: Intravenous  PONV Risk Score and Plan: 3 and Ondansetron, Propofol infusion and Dexamethasone  Airway Management Planned: Natural Airway and Simple Face Mask  Additional Equipment: None  Intra-op Plan:   Post-operative Plan:   Informed Consent: I have reviewed the patients History and Physical, chart, labs and discussed the procedure including the risks, benefits and alternatives for the proposed anesthesia with the patient or authorized representative who has indicated his/her understanding and acceptance.       Plan Discussed with: CRNA  Anesthesia Plan Comments: (Lab Results      Component                Value               Date                      WBC                      7.3                 11/22/2021                HGB                      13.8                11/22/2021                HCT                      40.8                11/22/2021                MCV                      99.0                 11/22/2021                PLT                      295                 11/22/2021           )  Anesthesia Quick Evaluation

## 2021-11-30 ENCOUNTER — Encounter (HOSPITAL_COMMUNITY): Payer: Self-pay | Admitting: Orthopedic Surgery

## 2021-11-30 ENCOUNTER — Encounter (HOSPITAL_COMMUNITY)
Admission: RE | Disposition: A | Discharge status: Home / Self Care | Payer: Self-pay | Source: Home / Self Care | Attending: Orthopedic Surgery

## 2021-11-30 ENCOUNTER — Ambulatory Visit (HOSPITAL_COMMUNITY): Payer: PPO | Admitting: Anesthesiology

## 2021-11-30 ENCOUNTER — Ambulatory Visit (HOSPITAL_COMMUNITY)
Admission: RE | Admit: 2021-11-30 | Discharge: 2021-11-30 | Disposition: A | Discharge status: Home / Self Care | Payer: PPO | Attending: Orthopedic Surgery | Admitting: Orthopedic Surgery

## 2021-11-30 ENCOUNTER — Ambulatory Visit (HOSPITAL_COMMUNITY): Payer: PPO

## 2021-11-30 DIAGNOSIS — M1711 Unilateral primary osteoarthritis, right knee: Secondary | ICD-10-CM | POA: Diagnosis present

## 2021-11-30 DIAGNOSIS — G8918 Other acute postprocedural pain: Secondary | ICD-10-CM | POA: Diagnosis not present

## 2021-11-30 DIAGNOSIS — R269 Unspecified abnormalities of gait and mobility: Secondary | ICD-10-CM | POA: Diagnosis not present

## 2021-11-30 DIAGNOSIS — Z96651 Presence of right artificial knee joint: Secondary | ICD-10-CM | POA: Diagnosis not present

## 2021-11-30 HISTORY — PX: KNEE ARTHROPLASTY: SHX992

## 2021-11-30 LAB — TYPE AND SCREEN
ABO/RH(D): O POS
Antibody Screen: NEGATIVE

## 2021-11-30 LAB — ABO/RH: ABO/RH(D): O POS

## 2021-11-30 SURGERY — ARTHROPLASTY, KNEE, TOTAL, USING IMAGELESS COMPUTER-ASSISTED NAVIGATION
Anesthesia: Spinal | Site: Knee | Laterality: Right

## 2021-11-30 MED ORDER — LACTATED RINGERS IV SOLN: INTRAVENOUS | Status: DC

## 2021-11-30 MED ORDER — MIDAZOLAM HCL 2 MG/2ML IJ SOLN
INTRAMUSCULAR | Status: AC
  Filled 2021-11-30: qty 2

## 2021-11-30 MED ORDER — FENTANYL CITRATE (PF) 100 MCG/2ML IJ SOLN
INTRAMUSCULAR | Status: AC
  Filled 2021-11-30: qty 2

## 2021-11-30 MED ORDER — POVIDONE-IODINE 10 % EX SWAB
2.0000 "application " | Freq: Once | CUTANEOUS | Status: AC
  Administered 2021-11-30: 2 via TOPICAL

## 2021-11-30 MED ORDER — ONDANSETRON HCL 4 MG/2ML IJ SOLN
INTRAMUSCULAR | Status: AC
  Filled 2021-11-30: qty 4

## 2021-11-30 MED ORDER — BUPIVACAINE IN DEXTROSE 0.75-8.25 % IT SOLN
INTRATHECAL | Status: DC | PRN
  Administered 2021-11-30: 1.8 mL via INTRATHECAL

## 2021-11-30 MED ORDER — CEFAZOLIN SODIUM-DEXTROSE 2-4 GM/100ML-% IV SOLN
INTRAVENOUS | Status: AC
  Filled 2021-11-30: qty 100

## 2021-11-30 MED ORDER — HYDROMORPHONE HCL 1 MG/ML IJ SOLN
0.2500 mg | INTRAMUSCULAR | Status: DC | PRN
  Administered 2021-11-30 (×3): 0.5 mg via INTRAVENOUS

## 2021-11-30 MED ORDER — BUPIVACAINE-EPINEPHRINE (PF) 0.25% -1:200000 IJ SOLN
INTRAMUSCULAR | Status: AC
  Filled 2021-11-30: qty 30

## 2021-11-30 MED ORDER — ISOPROPYL ALCOHOL 70 % SOLN
Status: AC
  Filled 2021-11-30: qty 480

## 2021-11-30 MED ORDER — ORAL CARE MOUTH RINSE: 15.0000 mL | Freq: Once | OROMUCOSAL | Status: AC

## 2021-11-30 MED ORDER — CEFAZOLIN SODIUM-DEXTROSE 2-4 GM/100ML-% IV SOLN
2.0000 g | INTRAVENOUS | Status: AC
  Administered 2021-11-30: 2 g via INTRAVENOUS
  Filled 2021-11-30: qty 100

## 2021-11-30 MED ORDER — ISOPROPYL ALCOHOL 70 % SOLN
Status: DC | PRN
  Administered 2021-11-30: 1 via TOPICAL

## 2021-11-30 MED ORDER — PROPOFOL 10 MG/ML IV BOLUS
INTRAVENOUS | Status: AC
  Filled 2021-11-30: qty 20

## 2021-11-30 MED ORDER — PHENYLEPHRINE HCL-NACL 20-0.9 MG/250ML-% IV SOLN
INTRAVENOUS | Status: AC
  Filled 2021-11-30: qty 750

## 2021-11-30 MED ORDER — ONDANSETRON HCL 4 MG/2ML IJ SOLN
INTRAMUSCULAR | Status: DC | PRN
  Administered 2021-11-30: 4 mg via INTRAVENOUS

## 2021-11-30 MED ORDER — STERILE WATER FOR IRRIGATION IR SOLN
Status: DC | PRN
  Administered 2021-11-30: 2000 mL

## 2021-11-30 MED ORDER — EPHEDRINE 5 MG/ML INJ
INTRAVENOUS | Status: AC
  Filled 2021-11-30: qty 5

## 2021-11-30 MED ORDER — TRANEXAMIC ACID-NACL 1000-0.7 MG/100ML-% IV SOLN
1000.0000 mg | INTRAVENOUS | Status: AC
  Administered 2021-11-30: 1000 mg via INTRAVENOUS
  Filled 2021-11-30: qty 100

## 2021-11-30 MED ORDER — OXYCODONE HCL 5 MG PO TABS
5.0000 mg | ORAL_TABLET | ORAL | Status: DC | PRN
  Administered 2021-11-30 (×2): 5 mg via ORAL

## 2021-11-30 MED ORDER — ACETAMINOPHEN 160 MG/5ML PO SOLN: 325.0000 mg | Freq: Once | ORAL | Status: DC | PRN

## 2021-11-30 MED ORDER — HYDROMORPHONE HCL 1 MG/ML IJ SOLN
INTRAMUSCULAR | Status: AC
  Administered 2021-11-30: 0.5 mg via INTRAVENOUS
  Filled 2021-11-30: qty 2

## 2021-11-30 MED ORDER — KETOROLAC TROMETHAMINE 15 MG/ML IJ SOLN
INTRAMUSCULAR | Status: AC
  Filled 2021-11-30: qty 1

## 2021-11-30 MED ORDER — MEPERIDINE HCL 50 MG/ML IJ SOLN: 6.2500 mg | INTRAMUSCULAR | Status: DC | PRN

## 2021-11-30 MED ORDER — FENTANYL CITRATE (PF) 100 MCG/2ML IJ SOLN
INTRAMUSCULAR | Status: DC | PRN
  Administered 2021-11-30 (×2): 50 ug via INTRAVENOUS

## 2021-11-30 MED ORDER — PROPOFOL 1000 MG/100ML IV EMUL
INTRAVENOUS | Status: AC
  Filled 2021-11-30: qty 100

## 2021-11-30 MED ORDER — LACTATED RINGERS IV BOLUS
500.0000 mL | Freq: Once | INTRAVENOUS | Status: AC
  Administered 2021-11-30: 500 mL via INTRAVENOUS

## 2021-11-30 MED ORDER — LIDOCAINE 2% (20 MG/ML) 5 ML SYRINGE
INTRAMUSCULAR | Status: DC | PRN
  Administered 2021-11-30: 50 mg via INTRAVENOUS

## 2021-11-30 MED ORDER — DEXAMETHASONE SODIUM PHOSPHATE 10 MG/ML IJ SOLN
INTRAMUSCULAR | Status: DC | PRN
  Administered 2021-11-30: 4 mg via INTRAVENOUS

## 2021-11-30 MED ORDER — AMISULPRIDE (ANTIEMETIC) 5 MG/2ML IV SOLN: 10.0000 mg | Freq: Once | INTRAVENOUS | Status: DC | PRN

## 2021-11-30 MED ORDER — LACTATED RINGERS IV BOLUS
250.0000 mL | Freq: Once | INTRAVENOUS | Status: AC
  Administered 2021-11-30: 250 mL via INTRAVENOUS

## 2021-11-30 MED ORDER — PROPOFOL 500 MG/50ML IV EMUL
INTRAVENOUS | Status: DC | PRN
  Administered 2021-11-30: 75 ug/kg/min via INTRAVENOUS

## 2021-11-30 MED ORDER — CEFAZOLIN SODIUM-DEXTROSE 2-4 GM/100ML-% IV SOLN
2.0000 g | Freq: Four times a day (QID) | INTRAVENOUS | Status: DC
  Administered 2021-11-30: 2 g via INTRAVENOUS

## 2021-11-30 MED ORDER — POVIDONE-IODINE 10 % EX SWAB: 2.0000 "application " | Freq: Once | CUTANEOUS | Status: DC

## 2021-11-30 MED ORDER — SODIUM CHLORIDE 0.9 % IR SOLN
Status: DC | PRN
  Administered 2021-11-30: 1000 mL

## 2021-11-30 MED ORDER — SODIUM CHLORIDE 0.9 % IV SOLN: INTRAVENOUS | Status: DC

## 2021-11-30 MED ORDER — LIDOCAINE HCL (PF) 2 % IJ SOLN
INTRAMUSCULAR | Status: AC
  Filled 2021-11-30: qty 10

## 2021-11-30 MED ORDER — ROPIVACAINE HCL 5 MG/ML IJ SOLN
INTRAMUSCULAR | Status: DC | PRN
  Administered 2021-11-30: 30 mL via PERINEURAL

## 2021-11-30 MED ORDER — ACETAMINOPHEN 10 MG/ML IV SOLN
1000.0000 mg | Freq: Once | INTRAVENOUS | Status: AC
  Administered 2021-11-30: 1000 mg via INTRAVENOUS
  Filled 2021-11-30: qty 100

## 2021-11-30 MED ORDER — PHENYLEPHRINE HCL-NACL 20-0.9 MG/250ML-% IV SOLN
INTRAVENOUS | Status: DC | PRN
  Administered 2021-11-30: 15 ug/min via INTRAVENOUS

## 2021-11-30 MED ORDER — KETOROLAC TROMETHAMINE 15 MG/ML IJ SOLN
7.5000 mg | Freq: Four times a day (QID) | INTRAMUSCULAR | Status: DC
  Administered 2021-11-30: 7.5 mg via INTRAVENOUS

## 2021-11-30 MED ORDER — DEXAMETHASONE SODIUM PHOSPHATE 10 MG/ML IJ SOLN
INTRAMUSCULAR | Status: AC
  Filled 2021-11-30: qty 2

## 2021-11-30 MED ORDER — KETOROLAC TROMETHAMINE 30 MG/ML IJ SOLN
INTRAMUSCULAR | Status: DC | PRN
  Administered 2021-11-30: 30 mg via INTRA_ARTICULAR

## 2021-11-30 MED ORDER — BUPIVACAINE-EPINEPHRINE 0.25% -1:200000 IJ SOLN
INTRAMUSCULAR | Status: DC | PRN
  Administered 2021-11-30: 30 mL

## 2021-11-30 MED ORDER — SODIUM CHLORIDE (PF) 0.9 % IJ SOLN
INTRAMUSCULAR | Status: DC | PRN
  Administered 2021-11-30: 30 mL

## 2021-11-30 MED ORDER — PROPOFOL 10 MG/ML IV BOLUS
INTRAVENOUS | Status: DC | PRN
  Administered 2021-11-30: 30 mg via INTRAVENOUS

## 2021-11-30 MED ORDER — CHLORHEXIDINE GLUCONATE 0.12 % MT SOLN
15.0000 mL | Freq: Once | OROMUCOSAL | Status: AC
  Administered 2021-11-30: 15 mL via OROMUCOSAL

## 2021-11-30 MED ORDER — OXYCODONE HCL 5 MG PO TABS: 10.0000 mg | ORAL_TABLET | ORAL | Status: DC | PRN

## 2021-11-30 MED ORDER — MIDAZOLAM HCL 5 MG/5ML IJ SOLN
INTRAMUSCULAR | Status: DC | PRN
  Administered 2021-11-30 (×2): 1 mg via INTRAVENOUS

## 2021-11-30 MED ORDER — OXYCODONE HCL 5 MG PO TABS
ORAL_TABLET | ORAL | Status: AC
  Filled 2021-11-30: qty 1

## 2021-11-30 MED ORDER — KETOROLAC TROMETHAMINE 30 MG/ML IJ SOLN
INTRAMUSCULAR | Status: AC
  Filled 2021-11-30: qty 1

## 2021-11-30 MED ORDER — ACETAMINOPHEN 10 MG/ML IV SOLN: 1000.0000 mg | Freq: Once | INTRAVENOUS | Status: DC | PRN

## 2021-11-30 MED ORDER — ACETAMINOPHEN 325 MG PO TABS: 325.0000 mg | ORAL_TABLET | Freq: Once | ORAL | Status: DC | PRN

## 2021-11-30 MED ORDER — PROMETHAZINE HCL 25 MG/ML IJ SOLN: 6.2500 mg | INTRAMUSCULAR | Status: DC | PRN

## 2021-11-30 MED ORDER — EPHEDRINE SULFATE-NACL 50-0.9 MG/10ML-% IV SOSY
PREFILLED_SYRINGE | INTRAVENOUS | Status: DC | PRN
Start: 2021-11-30 — End: 2021-11-30
  Administered 2021-11-30: 5 mg via INTRAVENOUS

## 2021-11-30 SURGICAL SUPPLY — 74 items
ADH SKN CLS APL DERMABOND .7 (GAUZE/BANDAGES/DRESSINGS) ×1
APL PRP STRL LF DISP 70% ISPRP (MISCELLANEOUS) ×2
BAG COUNTER SPONGE SURGICOUNT (BAG) IMPLANT
BAG SPEC THK2 15X12 ZIP CLS (MISCELLANEOUS)
BAG SPNG CNTER NS LX DISP (BAG)
BAG ZIPLOCK 12X15 (MISCELLANEOUS) IMPLANT
BASEPLATE TIBIAL SZ2 TRI (Joint) IMPLANT
BATTERY INSTRU NAVIGATION (MISCELLANEOUS) ×6 IMPLANT
BLADE SAW RECIPROCATING 77.5 (BLADE) ×2 IMPLANT
BNDG CMPR MED 10X6 ELC LF (GAUZE/BANDAGES/DRESSINGS) ×1
BNDG ELASTIC 4X5.8 VLCR STR LF (GAUZE/BANDAGES/DRESSINGS) ×2 IMPLANT
BNDG ELASTIC 6X10 VLCR STRL LF (GAUZE/BANDAGES/DRESSINGS) ×1 IMPLANT
BNDG ELASTIC 6X5.8 VLCR STR LF (GAUZE/BANDAGES/DRESSINGS) ×2 IMPLANT
BSPLAT TIB 2 KN TRITANIUM (Joint) ×1 IMPLANT
BTRY SRG DRVR LF (MISCELLANEOUS) ×3
CHLORAPREP W/TINT 26 (MISCELLANEOUS) ×4 IMPLANT
COMPONENT TRI CR RETAIN KNEE (Orthopedic Implant) IMPLANT
COVER SURGICAL LIGHT HANDLE (MISCELLANEOUS) ×2 IMPLANT
DECANTER SPIKE VIAL GLASS SM (MISCELLANEOUS) ×4 IMPLANT
DERMABOND ADVANCED (GAUZE/BANDAGES/DRESSINGS) ×1
DERMABOND ADVANCED .7 DNX12 (GAUZE/BANDAGES/DRESSINGS) ×2 IMPLANT
DRAPE INCISE IOBAN 66X45 STRL (DRAPES) ×6 IMPLANT
DRAPE SHEET LG 3/4 BI-LAMINATE (DRAPES) ×6 IMPLANT
DRAPE U-SHAPE 47X51 STRL (DRAPES) ×2 IMPLANT
DRSG AQUACEL AG ADV 3.5X10 (GAUZE/BANDAGES/DRESSINGS) ×1 IMPLANT
DRSG AQUACEL AG ADV 3.5X14 (GAUZE/BANDAGES/DRESSINGS) ×2 IMPLANT
ELECT BLADE TIP CTD 4 INCH (ELECTRODE) ×2 IMPLANT
ELECT REM PT RETURN 15FT ADLT (MISCELLANEOUS) ×2 IMPLANT
GAUZE SPONGE 4X4 12PLY STRL (GAUZE/BANDAGES/DRESSINGS) ×2 IMPLANT
GLOVE SRG 8 PF TXTR STRL LF DI (GLOVE) ×2 IMPLANT
GLOVE SURG ENC MOIS LTX SZ8.5 (GLOVE) ×4 IMPLANT
GLOVE SURG ENC TEXT LTX SZ7.5 (GLOVE) ×6 IMPLANT
GLOVE SURG UNDER POLY LF SZ8 (GLOVE) ×4
GLOVE SURG UNDER POLY LF SZ8.5 (GLOVE) ×2 IMPLANT
GOWN SPEC L3 XXLG W/TWL (GOWN DISPOSABLE) ×2 IMPLANT
GOWN STRL REUS W/TWL XL LVL3 (GOWN DISPOSABLE) ×2 IMPLANT
HANDPIECE INTERPULSE COAX TIP (DISPOSABLE) ×2
HOLDER FOLEY CATH W/STRAP (MISCELLANEOUS) ×2 IMPLANT
HOOD PEEL AWAY FLYTE STAYCOOL (MISCELLANEOUS) ×6 IMPLANT
INSERT TIBIAL BEARING SZ2 11 (Insert) ×1 IMPLANT
JET LAVAGE IRRISEPT WOUND (IRRIGATION / IRRIGATOR)
KIT TURNOVER KIT A (KITS) IMPLANT
KNEE PATELLA ASYMMETRIC 9X29 (Knees) ×1 IMPLANT
LAVAGE JET IRRISEPT WOUND (IRRIGATION / IRRIGATOR) IMPLANT
MARKER SKIN DUAL TIP RULER LAB (MISCELLANEOUS) ×2 IMPLANT
NDL SAFETY ECLIPSE 18X1.5 (NEEDLE) ×1 IMPLANT
NDL SPNL 18GX3.5 QUINCKE PK (NEEDLE) ×1 IMPLANT
NEEDLE HYPO 18GX1.5 SHARP (NEEDLE) ×2
NEEDLE SPNL 18GX3.5 QUINCKE PK (NEEDLE) ×2 IMPLANT
NS IRRIG 1000ML POUR BTL (IV SOLUTION) ×2 IMPLANT
PACK TOTAL KNEE CUSTOM (KITS) ×2 IMPLANT
PADDING CAST COTTON 6X4 STRL (CAST SUPPLIES) ×2 IMPLANT
PROTECTOR NERVE ULNAR (MISCELLANEOUS) ×2 IMPLANT
SAW OSC TIP CART 19.5X105X1.3 (SAW) ×2 IMPLANT
SEALER BIPOLAR AQUA 6.0 (INSTRUMENTS) ×2 IMPLANT
SET HNDPC FAN SPRY TIP SCT (DISPOSABLE) ×1 IMPLANT
SET PAD KNEE POSITIONER (MISCELLANEOUS) ×2 IMPLANT
SPONGE T-LAP 18X18 ~~LOC~~+RFID (SPONGE) ×6 IMPLANT
SUT MNCRL AB 3-0 PS2 18 (SUTURE) ×2 IMPLANT
SUT MNCRL AB 4-0 PS2 18 (SUTURE) ×2 IMPLANT
SUT MON AB 2-0 CT1 36 (SUTURE) ×2 IMPLANT
SUT STRATAFIX PDO 1 14 VIOLET (SUTURE) ×2
SUT STRATFX PDO 1 14 VIOLET (SUTURE) ×1
SUT VIC AB 1 CTX 36 (SUTURE) ×4
SUT VIC AB 1 CTX36XBRD ANBCTR (SUTURE) ×2 IMPLANT
SUT VIC AB 2-0 CT1 27 (SUTURE) ×2
SUT VIC AB 2-0 CT1 TAPERPNT 27 (SUTURE) ×1 IMPLANT
SUTURE STRATFX PDO 1 14 VIOLET (SUTURE) ×1 IMPLANT
TIBIAL BASEPLATE SZ2 TRI (Joint) ×2 IMPLANT
TRAY FOLEY MTR SLVR 16FR STAT (SET/KITS/TRAYS/PACK) IMPLANT
TRIA CRUCIATE RETAIN KNEE (Orthopedic Implant) ×2 IMPLANT
TUBE SUCTION HIGH CAP CLEAR NV (SUCTIONS) ×2 IMPLANT
WATER STERILE IRR 1000ML POUR (IV SOLUTION) ×4 IMPLANT
WRAP KNEE MAXI GEL POST OP (GAUZE/BANDAGES/DRESSINGS) ×1 IMPLANT

## 2021-11-30 NOTE — Anesthesia Postprocedure Evaluation (Signed)
Anesthesia Post Note  Patient: Kristin Andersen  Procedure(s) Performed: COMPUTER ASSISTED TOTAL KNEE ARTHROPLASTY (Right: Knee)     Patient location during evaluation: PACU Anesthesia Type: Spinal Level of consciousness: oriented and awake and alert Pain management: pain level controlled Vital Signs Assessment: post-procedure vital signs reviewed and stable Respiratory status: spontaneous breathing, respiratory function stable and patient connected to nasal cannula oxygen Cardiovascular status: blood pressure returned to baseline and stable Postop Assessment: no headache, no backache and no apparent nausea or vomiting Anesthetic complications: no   No notable events documented.  Last Vitals:  Vitals:   11/30/21 1135 11/30/21 1200  BP: 131/77 121/69  Pulse: 80 96  Resp: 12 15  Temp: (!) 36.4 C   SpO2: 95% 93%    Last Pain:  Vitals:   11/30/21 1155  TempSrc:   PainSc: [place] Laylaa Guevarra

## 2021-11-30 NOTE — Anesthesia Procedure Notes (Signed)
Spinal  Patient location during procedure: OR Start time: 11/30/2021 7:50 AM End time: 11/30/2021 7:50 AM Reason for block: surgical anesthesia Staffing Performed: resident/CRNA  Anesthesiologist: Effie Berkshire, MD Resident/CRNA: Lavina Hamman, CRNA Preanesthetic Checklist Completed: patient identified, IV checked, site marked, risks and benefits discussed, surgical consent, monitors and equipment checked, pre-op evaluation and timeout performed Spinal Block Patient position: sitting Prep: DuraPrep Patient monitoring: heart rate, cardiac monitor, continuous pulse ox and blood pressure Approach: midline Location: L4-5 Injection technique: single-shot Needle Needle type: Sprotte  Needle gauge: 24 G Needle length: 9 cm Needle insertion depth: 5 cm Assessment Sensory level: T4 Events: CSF return Additional Notes IV functioning, monitors applied to pt. Expiration date of kit checked and confirmed to be in date. Sterile prep and drape, hand hygiene and sterile gloved used. Pt was positioned and spine was prepped in sterile fashion. Skin was anesthetized with lidocaine. Free flow of clear CSF obtained prior to injecting local anesthetic into CSF x 1 attempt. Spinal needle aspirated freely following injection. Needle was carefully withdrawn, and pt tolerated procedure well. Loss of motor and sensory on exam post injection.

## 2021-11-30 NOTE — Care Plan (Addendum)
Ortho Bundle Case Management Note  Patient Details  Name: Kristin Andersen MRN: [id number] Date of Birth: January 14, 1956  R TKA on 11-30-21 DCP:  Home with husband. DME:  No needs, has a RW PT:  [facility] on 12-04-21.                   DME Arranged:  N/A DME Agency:  NA  HH Arranged:  NA HH Agency:  NA  Additional Comments: Please contact me with any questions of if this plan should need to change.  Marianne Sofia, RN,CCM [facility]  [phone] 11/30/2021, 12:20 PM

## 2021-11-30 NOTE — Transfer of Care (Signed)
Immediate Anesthesia Transfer of Care Note  Patient: Kristin Andersen  Procedure(s) Performed: Procedure(s): COMPUTER ASSISTED TOTAL KNEE ARTHROPLASTY (Right)  Patient Location: PACU  Anesthesia Type:Spinal  Level of Consciousness:  sedated, patient cooperative and responds to stimulation  Airway & Oxygen Therapy:Patient Spontanous Breathing and Patient connected to face mask oxgen  Post-op Assessment:  Report given to PACU RN and Post -op Vital signs reviewed and stable  Post vital signs:  Reviewed and stable  Last Vitals:  Vitals:   11/30/21 0542  BP: 122/84  Pulse: 89  Resp: 16  Temp: 36.8 C  SpO2: 16%    Complications: No apparent anesthesia complications

## 2021-11-30 NOTE — Evaluation (Signed)
Physical Therapy Evaluation Patient Details Name: Kristin Andersen MRN: [id number] DOB: 25-Feb-1956 Today's Date: 11/30/2021  History of Present Illness  66 y.o. female presented 11/30/21 for R TKA. PMH includes plantar fasciitis, piriformis syndrome.  Clinical Impression  Pt is mobilizing well, she ambulated 120' with RW, completed stair training, and demonstrates good understanding of HEP. She is ready to DC home from a PT standpoint.        Recommendations for follow up therapy are one component of a multi-disciplinary discharge planning process, led by the attending physician.  Recommendations may be updated based on patient status, additional functional criteria and insurance authorization.  Follow Up Recommendations Outpatient PT    Assistance Recommended at Discharge Intermittent Supervision/Assistance  Patient can return home with the following  Help with stairs or ramp for entrance;Assist for transportation;A little help with bathing/dressing/bathroom    Equipment Recommendations None recommended by PT  Recommendations for Other Services       Functional Status Assessment Patient has had a recent decline in their functional status and demonstrates the ability to make significant improvements in function in a reasonable and predictable amount of time.     Precautions / Restrictions Precautions Precautions: Knee Precaution Booklet Issued: Yes (comment) Precaution Comments: reviewed no pillow under knee Restrictions Weight Bearing Restrictions: No Other Position/Activity Restrictions: WBAT      Mobility  Bed Mobility Overal bed mobility: Modified Independent             General bed mobility comments: HOB up    Transfers Overall transfer level: Needs assistance Equipment used: Rolling walker (2 wheels) Transfers: Sit to/from Stand Sit to Stand: Supervision           General transfer comment: VCs hand and RLE placement     Ambulation/Gait Ambulation/Gait assistance: Supervision Gait Distance (Feet): 120 Feet Assistive device: Rolling walker (2 wheels) Gait Pattern/deviations: Step-to pattern;Decreased step length - right;Decreased step length - left Gait velocity: decr     General Gait Details: VCs sequencing, no loss of balance, 7/10 R knee pain with walking  Stairs Stairs: Yes Stairs assistance: Supervision Stair Management: No rails;Backwards;Step to pattern;With walker Number of Stairs: 3 General stair comments: husband present and assisted with stabilizing RW; pt also ascended/descended 3 stairs forwards with L rail with supervision,  VCs sequencing  Wheelchair Mobility    Modified Rankin (Stroke Patients Only)       Balance Overall balance assessment: Modified Independent                                           Pertinent Vitals/Pain Pain Assessment: 0-10 Pain Score: 3  Pain Location: R knee Pain Descriptors / Indicators: Aching Pain Intervention(s): Limited activity within patient's tolerance;Monitored during session;Premedicated before session;Ice applied    Home Living Family/patient expects to be discharged to:: Private residence Living Arrangements: Spouse/significant other Available Help at Discharge: Family;Available 24 hours/day Type of Home: House Home Access: Stairs to enter Entrance Stairs-Rails: None Entrance Stairs-Number of Steps: 3 Alternate Level Stairs-Number of Steps: flight Home Layout: Two level Home Equipment: Conservation officer, nature (2 wheels);Shower seat;Grab bars - tub/shower      Prior Function Prior Level of Function : Independent/Modified Independent             Mobility Comments: walked without AD, no falls in past 6 months       Hand Dominance  Extremity/Trunk Assessment   Upper Extremity Assessment Upper Extremity Assessment: Overall WFL for tasks assessed    Lower Extremity Assessment Lower Extremity  Assessment: RLE deficits/detail RLE Deficits / Details: SLR 3/5, knee AAROM 0-75* RLE Sensation: WNL RLE Coordination: WNL    Cervical / Trunk Assessment Cervical / Trunk Assessment: Normal  Communication   Communication: No difficulties  Cognition Arousal/Alertness: Awake/alert Behavior During Therapy: WFL for tasks assessed/performed Overall Cognitive Status: Within Functional Limits for tasks assessed                                          General Comments      Exercises Total Joint Exercises Ankle Circles/Pumps: AROM;Both;10 reps;Supine Quad Sets: AROM;Right;5 reps;Supine Short Arc Quad: AROM;Right;5 reps;Supine Heel Slides: AAROM;Right;5 reps;Supine Hip ABduction/ADduction: AROM;Right;5 reps;Supine Straight Leg Raises: AROM;Right;5 reps;Supine Long Arc Quad: AROM;Right;5 reps;Seated Knee Flexion: AAROM;Right;5 reps;Seated Goniometric ROM: 0-75*AAROM R knee   Assessment/Plan    PT Assessment All further PT needs can be met in the next venue of care  PT Problem List Pain;Decreased range of motion;Decreased activity tolerance       PT Treatment Interventions      PT Goals (Current goals can be found in the Care Plan section)  Acute Rehab PT Goals Patient Stated Goal: hiking PT Goal Formulation: All assessment and education complete, DC therapy    Frequency       Co-evaluation               AM-PAC PT "6 Clicks" Mobility  Outcome Measure Help needed turning from your back to your side while in a flat bed without using bedrails?: None Help needed moving from lying on your back to sitting on the side of a flat bed without using bedrails?: None Help needed moving to and from a bed to a chair (including a wheelchair)?: None Help needed standing up from a chair using your arms (e.g., wheelchair or bedside chair)?: None Help needed to walk in hospital room?: None Help needed climbing 3-5 steps with a railing? : A Little 6 Click Score:  23    End of Session Equipment Utilized During Treatment: Gait belt Activity Tolerance: Patient tolerated treatment well Patient left: in chair;with call bell/phone within reach;with family/visitor present Nurse Communication: Mobility status      Time: 6644-0347 PT Time Calculation (min) (ACUTE ONLY): 47 min   Charges:   PT Evaluation $PT Eval Moderate Complexity: 1 Mod PT Treatments $Gait Training: 8-22 mins $Therapeutic Exercise: 8-22 mins       Blondell Reveal Kistler PT 11/30/2021  Acute Rehabilitation Services Pager [phone] Office [phone]

## 2021-11-30 NOTE — Op Note (Signed)
OPERATIVE REPORT  SURGEON: Rod Can, MD   ASSISTANT: Cherlynn June, PA-C  PREOPERATIVE DIAGNOSIS: Primary Right knee arthritis.   POSTOPERATIVE DIAGNOSIS: Primary Right knee arthritis.   PROCEDURE: Right total knee arthroplasty.   IMPLANTS: Stryker Triathlon CR femur, size 3. Stryker Tritanium tibia, size 2. X3 polyethelyene insert, size 11 mm, CR. 3 button asymmetric patella, size 29 mm.  ANESTHESIA:  MAC, Regional, and Spinal  TOURNIQUET TIME: Not utilized.   ESTIMATED BLOOD LOSS:-300 mL    ANTIBIOTICS: 2 g Ancef.  DRAINS: None.  COMPLICATIONS: None   CONDITION: PACU - hemodynamically stable.   BRIEF CLINICAL NOTE: Kristin Andersen is a 66 y.o. female with a long-standing history of Right knee arthritis. After failing conservative management, the patient was indicated for total knee arthroplasty. The risks, benefits, and alternatives to the procedure were explained, and the patient elected to proceed.  PROCEDURE IN DETAIL: Adductor canal block was obtained in the pre-op holding area. Once inside the operative room, spinal anesthesia was obtained, and a foley catheter was inserted. The patient was then positioned, and the lower extremity was prepped and draped in the normal sterile surgical fashion.  A time-out was called verifying side and site of surgery. The patient received IV antibiotics within 60 minutes of beginning the procedure. The tourniquet was not utilized.   An anterior approach to the knee was performed utilizing a midvastus arthrotomy. A medial release was performed and the patellar fat pad was excised. Stryker imageless navigation was used to cut the distal femur perpendicular to the mechanical axis. A freehand patellar resection was performed, and the patella was sized and prepared with 3 lug holes.  Nagivation was used to make a neutral proximal tibia  resection, taking 9 mm of bone from the less affected lateral side with 3 degrees of slope. The menisci were excised. A spacer block was placed, and the alignment and balance in extension were confirmed.   The distal femur was sized using the 3-degree external rotation guide referencing the posterior femoral cortex. The appropriate 4-in-1 cutting block was pinned into place. Rotation was checked using Whiteside's line, the epicondylar axis, and then confirmed with a spacer block in flexion. The remaining femoral cuts were performed, taking care to protect the MCL.  The tibia was sized and the trial tray was pinned into place. The remaining trail components were inserted. The knee was stable to varus and valgus stress through a full range of motion. The patella tracked centrally, and the PCL was well balanced. The trial components were removed, and the proximal tibial surface was prepared. Final components were impacted into place. The knee was tested for a final time and found to be well balanced.   The wound was copiously irrigated with Irrisept solution and normal saline using pule lavage.  Marcaine solution was injected into the periarticular soft tissue.  The wound was closed in layers using #1 Vicryl and Stratafix for the fascia, 2-0 Vicryl for the subcutaneous fat, 2-0 Monocryl for the deep dermal layer, 3-0 running Monocryl subcuticular Stitch, and 4-0 Monocryl stay sutures at both ends of the wound. Dermabond was applied to the skin.  Once the glue was fully dried, an Aquacell Ag and compressive dressing were applied.  Tthe patient was transported to the recovery room in stable condition.  Sponge, needle, and instrument counts were correct at the end of the case x2.  The patient tolerated the procedure well and there were no known complications.  Please note that a surgical  assistant was a medical necessity for this procedure in order to perform it in a safe and expeditious manner. Surgical assistant  was necessary to retract the ligaments and vital neurovascular structures to prevent injury to them and also necessary for proper positioning of the limb to allow for anatomic placement of the prosthesis.

## 2021-11-30 NOTE — Interval H&P Note (Signed)
History and Physical Interval Note:  11/30/2021 7:24 AM  Kristin Andersen  has presented today for surgery, with the diagnosis of Right knee osteoarthritis.  The various methods of treatment have been discussed with the patient and family. After consideration of risks, benefits and other options for treatment, the patient has consented to  Procedure(s): COMPUTER ASSISTED TOTAL KNEE ARTHROPLASTY (Right) as a surgical intervention.  The patient's history has been reviewed, patient examined, no change in status, stable for surgery.  I have reviewed the patient's chart and labs.  Questions were answered to the patient's satisfaction.     Hilton Cork Lyan Holck

## 2021-11-30 NOTE — Anesthesia Procedure Notes (Signed)
Anesthesia Regional Block: Adductor canal block   Pre-Anesthetic Checklist: , timeout performed,  Correct Patient, Correct Site, Correct Laterality,  Correct Procedure, Correct Position, site marked,  Risks and benefits discussed,  Surgical consent,  Pre-op evaluation,  At surgeon's request and post-op pain management  Laterality: Right  Prep: chloraprep       Needles:  Injection technique: Single-shot  Needle Type: Echogenic Stimulator Needle     Needle Length: 9cm  Needle Gauge: 21     Additional Needles:   Procedures:,,,, ultrasound used (permanent image in chart),,    Narrative:  Start time: 11/30/2021 7:05 AM End time: 11/30/2021 7:10 AM Injection made incrementally with aspirations every 5 mL.  Performed by: Personally  Anesthesiologist: Effie Berkshire, MD  Additional Notes: Patient tolerated the procedure well. Local anesthetic introduced in an incremental fashion under minimal resistance after negative aspirations. No paresthesias were elicited. After completion of the procedure, no acute issues were identified and patient continued to be monitored by RN.

## 2021-11-30 NOTE — Discharge Instructions (Signed)
 Dr. Cecely Rengel Total Joint Specialist [facility] [address] [place], [place] [address] [phone]  TOTAL KNEE REPLACEMENT POSTOPERATIVE DIRECTIONS    Knee Rehabilitation, Guidelines Following Surgery  Results after knee surgery are often greatly improved when you follow the exercise, range of motion and muscle strengthening exercises prescribed by your doctor. Safety measures are also important to protect the knee from further injury. Any time any of these exercises cause you to have increased pain or swelling in your knee joint, decrease the amount until you are comfortable again and slowly increase them. If you have problems or questions, call your caregiver or physical therapist for advice.   WEIGHT BEARING Weight bearing as tolerated with assist device (walker, cane, etc) as directed, use it as long as suggested by your surgeon or therapist, typically at least 4-6 weeks.  HOME CARE INSTRUCTIONS  Remove items at home which could result in a fall. This includes throw rugs or furniture in walking pathways.  Continue medications as instructed at time of discharge. You may have some home medications which will be placed on hold until you complete the course of blood thinner medication.  You may start showering once you are discharged home but do not submerge the incision under water. Just pat the incision dry and apply a dry gauze dressing on daily. Walk with walker as instructed.  You may resume a sexual relationship in one month or when given the OK by your doctor.  Use walker as long as suggested by your caregivers. Avoid periods of inactivity such as sitting longer than an hour when not asleep. This helps prevent blood clots.  You may put full weight on your legs and walk as much as is comfortable.  You may return to work once you are cleared by your doctor.  Do not drive a car for 6 weeks or until released by you surgeon.  Do not drive while  taking narcotics.  Wear the elastic stockings for three weeks following surgery during the day but you may remove then at night. Make sure you keep all of your appointments after your operation with all of your doctors and caregivers. You should call the office at the above phone number and make an appointment for approximately two weeks after the date of your surgery. Do not remove your surgical dressing. The dressing is waterproof; you may take showers in 3 days, but do not take tub baths or submerge the dressing. Please pick up a stool softener and laxative for home use as long as you are requiring pain medications. ICE to the affected knee every three hours for 30 minutes at a time and then as needed for pain and swelling.  Continue to use ice on the knee for pain and swelling from surgery. You may notice swelling that will progress down to the foot and ankle.  This is normal after surgery.  Elevate the leg when you are not up walking on it.   It is important for you to complete the blood thinner medication as prescribed by your doctor. Continue to use the breathing machine which will help keep your temperature down.  It is common for your temperature to cycle up and down following surgery, especially at night when you are not up moving around and exerting yourself.  The breathing machine keeps your lungs expanded and your temperature down.  RANGE OF MOTION AND STRENGTHENING EXERCISES  Rehabilitation of the knee is important following a knee injury or an   operation. After just a few days of immobilization, the muscles of the thigh which control the knee become weakened and shrink (atrophy). Knee exercises are designed to build up the tone and strength of the thigh muscles and to improve knee motion. Often times heat used for twenty to thirty minutes before working out will loosen up your tissues and help with improving the range of motion but do not use heat for the first two weeks following surgery.  These exercises can be done on a training (exercise) mat, on the floor, on a table or on a bed. Use what ever works the best and is most comfortable for you Knee exercises include:  Leg Lifts - While your knee is still immobilized in a splint or cast, you can do straight leg raises. Lift the leg to 60 degrees, hold for 3 sec, and slowly lower the leg. Repeat 10-20 times 2-3 times daily. Perform this exercise against resistance later as your knee gets better.  Quad and Hamstring Sets - Tighten up the muscle on the front of the thigh (Quad) and hold for 5-10 sec. Repeat this 10-20 times hourly. Hamstring sets are done by pushing the foot backward against an object and holding for 5-10 sec. Repeat as with quad sets.  A rehabilitation program following serious knee injuries can speed recovery and prevent re-injury in the future due to weakened muscles. Contact your doctor or a physical therapist for more information on knee rehabilitation.   POST-OPERATIVE OPIOID TAPER INSTRUCTIONS: It is important to wean off of your opioid medication as soon as possible. If you do not need pain medication after your surgery it is ok to stop day one. Opioids include: Codeine, Hydrocodone(Norco, Vicodin), Oxycodone(Percocet, oxycontin) and hydromorphone amongst others.  Long term and even short term use of opiods can cause: Increased pain response Dependence Constipation Depression Respiratory depression And more.  Withdrawal symptoms can include Flu like symptoms Nausea, vomiting And more Techniques to manage these symptoms Hydrate well Eat regular healthy meals Stay active Use relaxation techniques(deep breathing, meditating, yoga) Do Not substitute Alcohol to help with tapering If you have been on opioids for less than two weeks and do not have pain than it is ok to stop all together.  Plan to wean off of opioids This plan should start within one week post op of your joint replacement. Maintain the same  interval or time between taking each dose and first decrease the dose.  Cut the total daily intake of opioids by one tablet each day Next start to increase the time between doses. The last dose that should be eliminated is the evening dose.    SKILLED REHAB INSTRUCTIONS: If the patient is transferred to a skilled rehab facility following release from the hospital, a list of the current medications will be sent to the facility for the patient to continue.  When discharged from the skilled rehab facility, please have the facility set up the patient's Home Health Physical Therapy prior to being released. Also, the skilled facility will be responsible for providing the patient with their medications at time of release from the facility to include their pain medication, the muscle relaxants, and their blood thinner medication. If the patient is still at the rehab facility at time of the two week follow up appointment, the skilled rehab facility will also need to assist the patient in arranging follow up appointment in our office and any transportation needs.  MAKE SURE YOU:  Understand these instructions.  Will watch   your condition.  Will get help right away if you are not doing well or get worse.    Pick up stool softner and laxative for home use following surgery while on pain medications. Do NOT remove your dressing. You may shower.  Do not take tub baths or submerge incision under water. May shower starting three days after surgery. Please use a clean towel to pat the incision dry following showers. Continue to use ice for pain and swelling after surgery. Do not use any lotions or creams on the incision until instructed by your surgeon.  

## 2021-12-04 DIAGNOSIS — J3081 Allergic rhinitis due to animal (cat) (dog) hair and dander: Secondary | ICD-10-CM | POA: Diagnosis not present

## 2021-12-04 DIAGNOSIS — J301 Allergic rhinitis due to pollen: Secondary | ICD-10-CM | POA: Diagnosis not present

## 2021-12-04 DIAGNOSIS — M25561 Pain in right knee: Secondary | ICD-10-CM | POA: Diagnosis not present

## 2021-12-06 DIAGNOSIS — M25561 Pain in right knee: Secondary | ICD-10-CM | POA: Diagnosis not present

## 2021-12-08 DIAGNOSIS — M25561 Pain in right knee: Secondary | ICD-10-CM | POA: Diagnosis not present

## 2021-12-11 ENCOUNTER — Encounter (HOSPITAL_COMMUNITY): Payer: Self-pay | Admitting: Orthopedic Surgery

## 2021-12-11 DIAGNOSIS — M25561 Pain in right knee: Secondary | ICD-10-CM | POA: Diagnosis not present

## 2021-12-13 DIAGNOSIS — M25561 Pain in right knee: Secondary | ICD-10-CM | POA: Diagnosis not present

## 2021-12-15 DIAGNOSIS — Z96651 Presence of right artificial knee joint: Secondary | ICD-10-CM | POA: Diagnosis not present

## 2021-12-15 DIAGNOSIS — Z4789 Encounter for other orthopedic aftercare: Secondary | ICD-10-CM | POA: Diagnosis not present

## 2021-12-15 DIAGNOSIS — M25561 Pain in right knee: Secondary | ICD-10-CM | POA: Diagnosis not present

## 2021-12-15 DIAGNOSIS — Z471 Aftercare following joint replacement surgery: Secondary | ICD-10-CM | POA: Diagnosis not present

## 2021-12-18 DIAGNOSIS — M25561 Pain in right knee: Secondary | ICD-10-CM | POA: Diagnosis not present

## 2021-12-21 DIAGNOSIS — M25561 Pain in right knee: Secondary | ICD-10-CM | POA: Diagnosis not present

## 2021-12-25 DIAGNOSIS — M25561 Pain in right knee: Secondary | ICD-10-CM | POA: Diagnosis not present

## 2021-12-26 DIAGNOSIS — J3081 Allergic rhinitis due to animal (cat) (dog) hair and dander: Secondary | ICD-10-CM | POA: Diagnosis not present

## 2021-12-26 DIAGNOSIS — J3089 Other allergic rhinitis: Secondary | ICD-10-CM | POA: Diagnosis not present

## 2021-12-26 DIAGNOSIS — J301 Allergic rhinitis due to pollen: Secondary | ICD-10-CM | POA: Diagnosis not present

## 2021-12-27 DIAGNOSIS — M25561 Pain in right knee: Secondary | ICD-10-CM | POA: Diagnosis not present

## 2022-01-01 DIAGNOSIS — M25561 Pain in right knee: Secondary | ICD-10-CM | POA: Diagnosis not present

## 2022-01-03 DIAGNOSIS — N952 Postmenopausal atrophic vaginitis: Secondary | ICD-10-CM | POA: Diagnosis not present

## 2022-01-03 DIAGNOSIS — K219 Gastro-esophageal reflux disease without esophagitis: Secondary | ICD-10-CM | POA: Diagnosis not present

## 2022-01-03 DIAGNOSIS — G4700 Insomnia, unspecified: Secondary | ICD-10-CM | POA: Diagnosis not present

## 2022-01-03 DIAGNOSIS — M79606 Pain in leg, unspecified: Secondary | ICD-10-CM | POA: Diagnosis not present

## 2022-01-03 DIAGNOSIS — N3010 Interstitial cystitis (chronic) without hematuria: Secondary | ICD-10-CM | POA: Diagnosis not present

## 2022-01-03 DIAGNOSIS — E039 Hypothyroidism, unspecified: Secondary | ICD-10-CM | POA: Diagnosis not present

## 2022-01-03 DIAGNOSIS — Z79899 Other long term (current) drug therapy: Secondary | ICD-10-CM | POA: Diagnosis not present

## 2022-01-03 DIAGNOSIS — F411 Generalized anxiety disorder: Secondary | ICD-10-CM | POA: Diagnosis not present

## 2022-01-03 DIAGNOSIS — M810 Age-related osteoporosis without current pathological fracture: Secondary | ICD-10-CM | POA: Diagnosis not present

## 2022-01-03 DIAGNOSIS — J309 Allergic rhinitis, unspecified: Secondary | ICD-10-CM | POA: Diagnosis not present

## 2022-01-03 DIAGNOSIS — Z0000 Encounter for general adult medical examination without abnormal findings: Secondary | ICD-10-CM | POA: Diagnosis not present

## 2022-01-04 DIAGNOSIS — M25561 Pain in right knee: Secondary | ICD-10-CM | POA: Diagnosis not present

## 2022-01-08 DIAGNOSIS — M25561 Pain in right knee: Secondary | ICD-10-CM | POA: Diagnosis not present

## 2022-01-10 DIAGNOSIS — M25561 Pain in right knee: Secondary | ICD-10-CM | POA: Diagnosis not present

## 2022-01-12 DIAGNOSIS — Z471 Aftercare following joint replacement surgery: Secondary | ICD-10-CM | POA: Diagnosis not present

## 2022-01-12 DIAGNOSIS — J301 Allergic rhinitis due to pollen: Secondary | ICD-10-CM | POA: Diagnosis not present

## 2022-01-12 DIAGNOSIS — J3081 Allergic rhinitis due to animal (cat) (dog) hair and dander: Secondary | ICD-10-CM | POA: Diagnosis not present

## 2022-01-12 DIAGNOSIS — Z96651 Presence of right artificial knee joint: Secondary | ICD-10-CM | POA: Diagnosis not present

## 2022-01-12 DIAGNOSIS — Z4789 Encounter for other orthopedic aftercare: Secondary | ICD-10-CM | POA: Diagnosis not present

## 2022-01-12 DIAGNOSIS — J3089 Other allergic rhinitis: Secondary | ICD-10-CM | POA: Diagnosis not present

## 2022-01-15 DIAGNOSIS — M25569 Pain in unspecified knee: Secondary | ICD-10-CM | POA: Diagnosis not present

## 2022-01-16 DIAGNOSIS — M25569 Pain in unspecified knee: Secondary | ICD-10-CM | POA: Diagnosis not present

## 2022-01-17 DIAGNOSIS — J301 Allergic rhinitis due to pollen: Secondary | ICD-10-CM | POA: Diagnosis not present

## 2022-01-17 DIAGNOSIS — J3089 Other allergic rhinitis: Secondary | ICD-10-CM | POA: Diagnosis not present

## 2022-01-17 DIAGNOSIS — J3081 Allergic rhinitis due to animal (cat) (dog) hair and dander: Secondary | ICD-10-CM | POA: Diagnosis not present

## 2022-01-22 DIAGNOSIS — M25569 Pain in unspecified knee: Secondary | ICD-10-CM | POA: Diagnosis not present

## 2022-01-24 DIAGNOSIS — M25569 Pain in unspecified knee: Secondary | ICD-10-CM | POA: Diagnosis not present

## 2022-01-25 DIAGNOSIS — J3081 Allergic rhinitis due to animal (cat) (dog) hair and dander: Secondary | ICD-10-CM | POA: Diagnosis not present

## 2022-01-25 DIAGNOSIS — J3089 Other allergic rhinitis: Secondary | ICD-10-CM | POA: Diagnosis not present

## 2022-01-25 DIAGNOSIS — J301 Allergic rhinitis due to pollen: Secondary | ICD-10-CM | POA: Diagnosis not present

## 2022-01-31 DIAGNOSIS — J301 Allergic rhinitis due to pollen: Secondary | ICD-10-CM | POA: Diagnosis not present

## 2022-01-31 DIAGNOSIS — J3089 Other allergic rhinitis: Secondary | ICD-10-CM | POA: Diagnosis not present

## 2022-01-31 DIAGNOSIS — J3081 Allergic rhinitis due to animal (cat) (dog) hair and dander: Secondary | ICD-10-CM | POA: Diagnosis not present

## 2022-02-02 DIAGNOSIS — M25569 Pain in unspecified knee: Secondary | ICD-10-CM | POA: Diagnosis not present

## 2022-02-06 DIAGNOSIS — M25569 Pain in unspecified knee: Secondary | ICD-10-CM | POA: Diagnosis not present

## 2022-02-08 DIAGNOSIS — M25569 Pain in unspecified knee: Secondary | ICD-10-CM | POA: Diagnosis not present

## 2022-02-12 DIAGNOSIS — M25569 Pain in unspecified knee: Secondary | ICD-10-CM | POA: Diagnosis not present

## 2022-02-13 DIAGNOSIS — J3081 Allergic rhinitis due to animal (cat) (dog) hair and dander: Secondary | ICD-10-CM | POA: Diagnosis not present

## 2022-02-13 DIAGNOSIS — J301 Allergic rhinitis due to pollen: Secondary | ICD-10-CM | POA: Diagnosis not present

## 2022-02-13 DIAGNOSIS — J3089 Other allergic rhinitis: Secondary | ICD-10-CM | POA: Diagnosis not present

## 2022-02-14 DIAGNOSIS — M25569 Pain in unspecified knee: Secondary | ICD-10-CM | POA: Diagnosis not present

## 2022-02-26 DIAGNOSIS — M25569 Pain in unspecified knee: Secondary | ICD-10-CM | POA: Diagnosis not present

## 2022-03-01 DIAGNOSIS — M25569 Pain in unspecified knee: Secondary | ICD-10-CM | POA: Diagnosis not present

## 2022-03-05 DIAGNOSIS — M25569 Pain in unspecified knee: Secondary | ICD-10-CM | POA: Diagnosis not present

## 2022-03-06 DIAGNOSIS — J301 Allergic rhinitis due to pollen: Secondary | ICD-10-CM | POA: Diagnosis not present

## 2022-03-06 DIAGNOSIS — J3089 Other allergic rhinitis: Secondary | ICD-10-CM | POA: Diagnosis not present

## 2022-03-06 DIAGNOSIS — J3081 Allergic rhinitis due to animal (cat) (dog) hair and dander: Secondary | ICD-10-CM | POA: Diagnosis not present

## 2022-03-08 DIAGNOSIS — M25569 Pain in unspecified knee: Secondary | ICD-10-CM | POA: Diagnosis not present

## 2022-03-13 DIAGNOSIS — L817 Pigmented purpuric dermatosis: Secondary | ICD-10-CM | POA: Diagnosis not present

## 2022-03-15 DIAGNOSIS — M25569 Pain in unspecified knee: Secondary | ICD-10-CM | POA: Diagnosis not present

## 2022-03-19 DIAGNOSIS — M25569 Pain in unspecified knee: Secondary | ICD-10-CM | POA: Diagnosis not present

## 2022-03-21 DIAGNOSIS — M25569 Pain in unspecified knee: Secondary | ICD-10-CM | POA: Diagnosis not present

## 2022-03-22 DIAGNOSIS — J3081 Allergic rhinitis due to animal (cat) (dog) hair and dander: Secondary | ICD-10-CM | POA: Diagnosis not present

## 2022-03-22 DIAGNOSIS — J301 Allergic rhinitis due to pollen: Secondary | ICD-10-CM | POA: Diagnosis not present

## 2022-03-22 DIAGNOSIS — J3089 Other allergic rhinitis: Secondary | ICD-10-CM | POA: Diagnosis not present

## 2022-03-27 DIAGNOSIS — M25569 Pain in unspecified knee: Secondary | ICD-10-CM | POA: Diagnosis not present

## 2022-03-29 DIAGNOSIS — M25569 Pain in unspecified knee: Secondary | ICD-10-CM | POA: Diagnosis not present

## 2022-04-03 DIAGNOSIS — M25569 Pain in unspecified knee: Secondary | ICD-10-CM | POA: Diagnosis not present

## 2022-04-05 DIAGNOSIS — J3081 Allergic rhinitis due to animal (cat) (dog) hair and dander: Secondary | ICD-10-CM | POA: Diagnosis not present

## 2022-04-05 DIAGNOSIS — J301 Allergic rhinitis due to pollen: Secondary | ICD-10-CM | POA: Diagnosis not present

## 2022-04-05 DIAGNOSIS — J3089 Other allergic rhinitis: Secondary | ICD-10-CM | POA: Diagnosis not present

## 2022-04-06 DIAGNOSIS — M25569 Pain in unspecified knee: Secondary | ICD-10-CM | POA: Diagnosis not present

## 2022-04-10 DIAGNOSIS — M25569 Pain in unspecified knee: Secondary | ICD-10-CM | POA: Diagnosis not present

## 2022-04-18 DIAGNOSIS — M25569 Pain in unspecified knee: Secondary | ICD-10-CM | POA: Diagnosis not present

## 2022-04-19 DIAGNOSIS — J301 Allergic rhinitis due to pollen: Secondary | ICD-10-CM | POA: Diagnosis not present

## 2022-04-19 DIAGNOSIS — J3089 Other allergic rhinitis: Secondary | ICD-10-CM | POA: Diagnosis not present

## 2022-04-19 DIAGNOSIS — J3081 Allergic rhinitis due to animal (cat) (dog) hair and dander: Secondary | ICD-10-CM | POA: Diagnosis not present

## 2022-04-20 DIAGNOSIS — M25569 Pain in unspecified knee: Secondary | ICD-10-CM | POA: Diagnosis not present

## 2022-04-23 DIAGNOSIS — J3089 Other allergic rhinitis: Secondary | ICD-10-CM | POA: Diagnosis not present

## 2022-04-23 DIAGNOSIS — J3081 Allergic rhinitis due to animal (cat) (dog) hair and dander: Secondary | ICD-10-CM | POA: Diagnosis not present

## 2022-04-23 DIAGNOSIS — J301 Allergic rhinitis due to pollen: Secondary | ICD-10-CM | POA: Diagnosis not present

## 2022-04-23 DIAGNOSIS — M25569 Pain in unspecified knee: Secondary | ICD-10-CM | POA: Diagnosis not present

## 2022-04-26 DIAGNOSIS — M25569 Pain in unspecified knee: Secondary | ICD-10-CM | POA: Diagnosis not present

## 2022-05-02 DIAGNOSIS — M25569 Pain in unspecified knee: Secondary | ICD-10-CM | POA: Diagnosis not present

## 2022-05-08 DIAGNOSIS — J3089 Other allergic rhinitis: Secondary | ICD-10-CM | POA: Diagnosis not present

## 2022-05-08 DIAGNOSIS — J301 Allergic rhinitis due to pollen: Secondary | ICD-10-CM | POA: Diagnosis not present

## 2022-05-08 DIAGNOSIS — J3081 Allergic rhinitis due to animal (cat) (dog) hair and dander: Secondary | ICD-10-CM | POA: Diagnosis not present

## 2022-05-09 DIAGNOSIS — M25569 Pain in unspecified knee: Secondary | ICD-10-CM | POA: Diagnosis not present

## 2022-05-14 DIAGNOSIS — M25569 Pain in unspecified knee: Secondary | ICD-10-CM | POA: Diagnosis not present

## 2022-05-15 DIAGNOSIS — J301 Allergic rhinitis due to pollen: Secondary | ICD-10-CM | POA: Diagnosis not present

## 2022-05-15 DIAGNOSIS — J3081 Allergic rhinitis due to animal (cat) (dog) hair and dander: Secondary | ICD-10-CM | POA: Diagnosis not present

## 2022-05-15 DIAGNOSIS — J3089 Other allergic rhinitis: Secondary | ICD-10-CM | POA: Diagnosis not present

## 2022-05-17 DIAGNOSIS — M25569 Pain in unspecified knee: Secondary | ICD-10-CM | POA: Diagnosis not present

## 2022-05-28 DIAGNOSIS — M25569 Pain in unspecified knee: Secondary | ICD-10-CM | POA: Diagnosis not present

## 2022-05-31 DIAGNOSIS — J3089 Other allergic rhinitis: Secondary | ICD-10-CM | POA: Diagnosis not present

## 2022-05-31 DIAGNOSIS — J301 Allergic rhinitis due to pollen: Secondary | ICD-10-CM | POA: Diagnosis not present

## 2022-05-31 DIAGNOSIS — J3081 Allergic rhinitis due to animal (cat) (dog) hair and dander: Secondary | ICD-10-CM | POA: Diagnosis not present

## 2022-06-04 DIAGNOSIS — Z471 Aftercare following joint replacement surgery: Secondary | ICD-10-CM | POA: Diagnosis not present

## 2022-06-04 DIAGNOSIS — Z96651 Presence of right artificial knee joint: Secondary | ICD-10-CM | POA: Diagnosis not present

## 2022-06-12 DIAGNOSIS — M25569 Pain in unspecified knee: Secondary | ICD-10-CM | POA: Diagnosis not present

## 2022-06-19 DIAGNOSIS — J3081 Allergic rhinitis due to animal (cat) (dog) hair and dander: Secondary | ICD-10-CM | POA: Diagnosis not present

## 2022-06-19 DIAGNOSIS — J3089 Other allergic rhinitis: Secondary | ICD-10-CM | POA: Diagnosis not present

## 2022-06-19 DIAGNOSIS — J301 Allergic rhinitis due to pollen: Secondary | ICD-10-CM | POA: Diagnosis not present

## 2022-06-20 DIAGNOSIS — M25569 Pain in unspecified knee: Secondary | ICD-10-CM | POA: Diagnosis not present

## 2022-06-25 DIAGNOSIS — L821 Other seborrheic keratosis: Secondary | ICD-10-CM | POA: Diagnosis not present

## 2022-06-25 DIAGNOSIS — L819 Disorder of pigmentation, unspecified: Secondary | ICD-10-CM | POA: Diagnosis not present

## 2022-06-25 DIAGNOSIS — L814 Other melanin hyperpigmentation: Secondary | ICD-10-CM | POA: Diagnosis not present

## 2022-06-25 DIAGNOSIS — D2262 Melanocytic nevi of left upper limb, including shoulder: Secondary | ICD-10-CM | POA: Diagnosis not present

## 2022-06-25 DIAGNOSIS — D2261 Melanocytic nevi of right upper limb, including shoulder: Secondary | ICD-10-CM | POA: Diagnosis not present

## 2022-06-25 DIAGNOSIS — D220 Melanocytic nevi of lip: Secondary | ICD-10-CM | POA: Diagnosis not present

## 2022-06-25 DIAGNOSIS — D2272 Melanocytic nevi of left lower limb, including hip: Secondary | ICD-10-CM | POA: Diagnosis not present

## 2022-06-25 DIAGNOSIS — D225 Melanocytic nevi of trunk: Secondary | ICD-10-CM | POA: Diagnosis not present

## 2022-07-09 DIAGNOSIS — Z1231 Encounter for screening mammogram for malignant neoplasm of breast: Secondary | ICD-10-CM | POA: Diagnosis not present

## 2022-07-11 DIAGNOSIS — J301 Allergic rhinitis due to pollen: Secondary | ICD-10-CM | POA: Diagnosis not present

## 2022-07-11 DIAGNOSIS — J3089 Other allergic rhinitis: Secondary | ICD-10-CM | POA: Diagnosis not present

## 2022-07-11 DIAGNOSIS — J3081 Allergic rhinitis due to animal (cat) (dog) hair and dander: Secondary | ICD-10-CM | POA: Diagnosis not present

## 2022-07-12 DIAGNOSIS — M25569 Pain in unspecified knee: Secondary | ICD-10-CM | POA: Diagnosis not present

## 2022-07-18 DIAGNOSIS — J3081 Allergic rhinitis due to animal (cat) (dog) hair and dander: Secondary | ICD-10-CM | POA: Diagnosis not present

## 2022-07-18 DIAGNOSIS — J301 Allergic rhinitis due to pollen: Secondary | ICD-10-CM | POA: Diagnosis not present

## 2022-07-18 DIAGNOSIS — J3089 Other allergic rhinitis: Secondary | ICD-10-CM | POA: Diagnosis not present

## 2022-07-18 DIAGNOSIS — M25569 Pain in unspecified knee: Secondary | ICD-10-CM | POA: Diagnosis not present

## 2022-07-19 ENCOUNTER — Encounter (HOSPITAL_BASED_OUTPATIENT_CLINIC_OR_DEPARTMENT_OTHER): Payer: Self-pay | Admitting: *Deleted

## 2022-07-24 DIAGNOSIS — N390 Urinary tract infection, site not specified: Secondary | ICD-10-CM | POA: Diagnosis not present

## 2022-07-25 DIAGNOSIS — M25569 Pain in unspecified knee: Secondary | ICD-10-CM | POA: Diagnosis not present

## 2022-07-27 DIAGNOSIS — J301 Allergic rhinitis due to pollen: Secondary | ICD-10-CM | POA: Diagnosis not present

## 2022-07-27 DIAGNOSIS — J3081 Allergic rhinitis due to animal (cat) (dog) hair and dander: Secondary | ICD-10-CM | POA: Diagnosis not present

## 2022-07-27 DIAGNOSIS — J3089 Other allergic rhinitis: Secondary | ICD-10-CM | POA: Diagnosis not present

## 2022-07-31 DIAGNOSIS — K219 Gastro-esophageal reflux disease without esophagitis: Secondary | ICD-10-CM | POA: Diagnosis not present

## 2022-07-31 DIAGNOSIS — Z86010 Personal history of colonic polyps: Secondary | ICD-10-CM | POA: Diagnosis not present

## 2022-07-31 DIAGNOSIS — K449 Diaphragmatic hernia without obstruction or gangrene: Secondary | ICD-10-CM | POA: Diagnosis not present

## 2022-07-31 DIAGNOSIS — Z1211 Encounter for screening for malignant neoplasm of colon: Secondary | ICD-10-CM | POA: Diagnosis not present

## 2022-08-02 DIAGNOSIS — M25569 Pain in unspecified knee: Secondary | ICD-10-CM | POA: Diagnosis not present

## 2022-08-02 DIAGNOSIS — J3081 Allergic rhinitis due to animal (cat) (dog) hair and dander: Secondary | ICD-10-CM | POA: Diagnosis not present

## 2022-08-02 DIAGNOSIS — J301 Allergic rhinitis due to pollen: Secondary | ICD-10-CM | POA: Diagnosis not present

## 2022-08-02 DIAGNOSIS — J3089 Other allergic rhinitis: Secondary | ICD-10-CM | POA: Diagnosis not present

## 2022-08-04 DIAGNOSIS — R102 Pelvic and perineal pain: Secondary | ICD-10-CM | POA: Diagnosis not present

## 2022-08-04 DIAGNOSIS — R300 Dysuria: Secondary | ICD-10-CM | POA: Diagnosis not present

## 2022-08-06 DIAGNOSIS — M25569 Pain in unspecified knee: Secondary | ICD-10-CM | POA: Diagnosis not present

## 2022-08-07 DIAGNOSIS — J3089 Other allergic rhinitis: Secondary | ICD-10-CM | POA: Diagnosis not present

## 2022-08-07 DIAGNOSIS — J301 Allergic rhinitis due to pollen: Secondary | ICD-10-CM | POA: Diagnosis not present

## 2022-08-07 DIAGNOSIS — N3010 Interstitial cystitis (chronic) without hematuria: Secondary | ICD-10-CM | POA: Diagnosis not present

## 2022-08-07 DIAGNOSIS — J3081 Allergic rhinitis due to animal (cat) (dog) hair and dander: Secondary | ICD-10-CM | POA: Diagnosis not present

## 2022-08-07 DIAGNOSIS — N3000 Acute cystitis without hematuria: Secondary | ICD-10-CM | POA: Diagnosis not present

## 2022-08-08 DIAGNOSIS — M25569 Pain in unspecified knee: Secondary | ICD-10-CM | POA: Diagnosis not present

## 2022-08-10 DIAGNOSIS — U071 COVID-19: Secondary | ICD-10-CM | POA: Diagnosis not present

## 2022-08-16 DIAGNOSIS — M25569 Pain in unspecified knee: Secondary | ICD-10-CM | POA: Diagnosis not present

## 2022-08-16 DIAGNOSIS — Z87442 Personal history of urinary calculi: Secondary | ICD-10-CM | POA: Diagnosis not present

## 2022-08-16 DIAGNOSIS — M545 Low back pain, unspecified: Secondary | ICD-10-CM | POA: Diagnosis not present

## 2022-08-16 DIAGNOSIS — R109 Unspecified abdominal pain: Secondary | ICD-10-CM | POA: Diagnosis not present

## 2022-08-16 DIAGNOSIS — K769 Liver disease, unspecified: Secondary | ICD-10-CM | POA: Diagnosis not present

## 2022-08-16 DIAGNOSIS — I700 Atherosclerosis of aorta: Secondary | ICD-10-CM | POA: Diagnosis not present

## 2022-08-20 DIAGNOSIS — M25569 Pain in unspecified knee: Secondary | ICD-10-CM | POA: Diagnosis not present

## 2022-08-21 DIAGNOSIS — J301 Allergic rhinitis due to pollen: Secondary | ICD-10-CM | POA: Diagnosis not present

## 2022-08-21 DIAGNOSIS — J3081 Allergic rhinitis due to animal (cat) (dog) hair and dander: Secondary | ICD-10-CM | POA: Diagnosis not present

## 2022-08-21 DIAGNOSIS — J3089 Other allergic rhinitis: Secondary | ICD-10-CM | POA: Diagnosis not present

## 2022-08-30 DIAGNOSIS — M25569 Pain in unspecified knee: Secondary | ICD-10-CM | POA: Diagnosis not present

## 2022-09-03 DIAGNOSIS — M25569 Pain in unspecified knee: Secondary | ICD-10-CM | POA: Diagnosis not present

## 2022-09-04 DIAGNOSIS — J301 Allergic rhinitis due to pollen: Secondary | ICD-10-CM | POA: Diagnosis not present

## 2022-09-04 DIAGNOSIS — J3089 Other allergic rhinitis: Secondary | ICD-10-CM | POA: Diagnosis not present

## 2022-09-04 DIAGNOSIS — J3081 Allergic rhinitis due to animal (cat) (dog) hair and dander: Secondary | ICD-10-CM | POA: Diagnosis not present

## 2022-09-06 DIAGNOSIS — M25569 Pain in unspecified knee: Secondary | ICD-10-CM | POA: Diagnosis not present

## 2022-09-10 DIAGNOSIS — M25569 Pain in unspecified knee: Secondary | ICD-10-CM | POA: Diagnosis not present

## 2022-09-11 NOTE — Therapy (Unsigned)
OUTPATIENT PHYSICAL THERAPY FEMALE PELVIC EVALUATION   Patient Name: Kristin Andersen MRN: [id number] DOB:[birth year], 66 y.o., female Today's Date: 09/12/2022   PT End of Session - 09/12/22 0840     Visit Number 1    Date for PT Re-Evaluation 12/05/22    Authorization Type health team    Authorization - Visit Number 1    Authorization - Number of Visits 10    PT Start Time 0800    PT Stop Time 6629    PT Time Calculation (min) 42 min    Activity Tolerance Patient tolerated treatment well;No increased pain    Behavior During Therapy WFL for tasks assessed/performed             Past Medical History:  Diagnosis Date   Anxiety    Arthritis    in knee   GERD (gastroesophageal reflux disease)    Heart palpitations    Due to anxiety    Hypothyroidism    IC (interstitial cystitis)    Myofascial pain dysfunction syndrome    Right side   Osteoporosis    Stomach ulcer    Thyroid disease    Past Surgical History:  Procedure Laterality Date   CERVICAL CONE BIOPSY     around age 70   [place] Right 11/30/2021   Procedure: COMPUTER ASSISTED TOTAL KNEE ARTHROPLASTY;  Surgeon: Rod Can, MD;  Location: [facility];  Service: Orthopedics;  Laterality: Right;   LIPOMA EXCISION Right 07/09/2017   Leg    ROOT CANAL  11/13   3 on 2 teeth   ROOT CANAL  04/16/14 and 04/27/14   x2   TONSILLECTOMY     Patient Active Problem List   Diagnosis Date Noted   Osteoarthritis of right knee 11/30/2021   Tear of right hamstring 05/30/2017   Abnormal leg finding 07/07/2015   Myofascial pain dysfunction syndrome 11/21/2012   Piriformis syndrome of right side 06/20/2012   Nonallopathic lesion of lumbosacral region 06/20/2012   Plantar fasciitis 04/17/2011   Acquired hallux rigidus 04/17/2011    PCP: Mayra Neer, MD  REFERRING PROVIDER: Mayra Neer,  MD  REFERRING DIAG: Interstitial Cystitis N30.10  THERAPY DIAG:  Cramp and spasm  Lower abdominal pain  Rationale for Evaluation and Treatment Rehabilitation  ONSET DATE: 05/2022  SUBJECTIVE:  SUBJECTIVE STATEMENT: Pelvic pain and discomfort with urination.    PAIN:  Are you having pain? Yes NPRS scale: 3/10 Pain location:  lower abdominal area  Pain type: aching and tight Pain description: constant   Aggravating factors: random Relieving factors: TENS  PRECAUTIONS: None  WEIGHT BEARING RESTRICTIONS: No  FALLS:  Has patient fallen in last 6 months? No  LIVING ENVIRONMENT: Lives with: lives with their spouse  OCCUPATION: retired  PLOF: Independent  PATIENT GOALS: get rid of pain  PERTINENT HISTORY:  Interstitial Cystitis; C-section x2  BOWEL MOVEMENT No issues   URINATION: Pain with urination: Yes, discomfort Fully empty bladder: Yes:   Stream: Strong Urgency: No Frequency: every 2-3 hours Leakage:  none Pads: No  INTERCOURSE: Pain with intercourse:  none  PREGNANCY: Vaginal deliveries 2  OBJECTIVE:   DIAGNOSTIC FINDINGS:  CT scan negative of pelvis    COGNITION: Overall cognitive status: Within functional limits for tasks assessed     SENSATION: Light touch: Appears intact Proprioception: Appears intact   PELVIC ALIGNMENT: ASIS are equal   LUMBARAROM/PROM: Lumbar ROM is full  Bilateral hip ROM is full.    PALPATION:   General  Patient has tenderness located in right suprapubic area, tightness in the scar along the midline of the lower abdomen. Tightness in the hip adductors and tenderness located at the insertion.                 External Perineal Exam intact and good coloring                             Internal Pelvic Floor tenderness located  in bilateral obturator internist, iliococcygeus, and sides of the bladder  Patient confirms identification and approves PT to assess internal pelvic floor and treatment Yes  PELVIC MMT: Not tested due to tightness in the muscles and no urinary leakage.    MMT eval  Vaginal   Internal Anal Sphincter   External Anal Sphincter   Puborectalis   Diastasis Recti   (Blank rows = not tested)        TONE: increased   TODAY'S TREATMENT:                                                                                                                   DATE: 09/12/2022 EVAL completed eval   PATIENT EDUCATION:  Education details: sent patient a you tube video to work with her vaginal wand  Person educated: Patient Education method: Explanation and you tube video Education comprehension: verbalized understanding  HOME EXERCISE PROGRAM: See above.   ASSESSMENT:  CLINICAL IMPRESSION: Patient is a 66 y.o. female  who was seen today for physical therapy evaluation and treatment for interstitial cystitis. Patient has had a flare-up several months ago after she had a UTI. Patient reports lower abdominal discomfort at level 3/10. Patient has discomfort with urination. She has tenderness located in the obturator internist, iliococcygeus, and sides of the bladder. She has  increased tone of the pelvic floor. She has right lower quadrant tenderness in the abdomen. The scar on the lower abdomen was restricted.   OBJECTIVE IMPAIRMENTS: increased fascial restrictions, increased muscle spasms, and pain.   ACTIVITY LIMITATIONS: toileting  PARTICIPATION LIMITATIONS: shopping and community activity  PERSONAL FACTORS: 1-2 comorbidities: interstitial cystitis, right TKR  are also affecting patient's functional outcome.   REHAB POTENTIAL: Excellent  CLINICAL DECISION MAKING: Stable/uncomplicated  EVALUATION COMPLEXITY: Low   GOALS: Goals reviewed with patient? Yes  SHORT TERM GOALS: Target date:  10/10/2022  Patient is using her vaginal wand on the pelvic floor muscles to reduce the tightness and trigger points.  Baseline: Goal status: INITIAL  2.  Patient reports her abdominal pain decreased >/= 25% due to improve tissue mobility of the lower abdomen and pelvic floor muscles.  Baseline:  Goal status: INITIAL  3.  Patient independent with diaphragmatic breathing with pelvic drop to elongate the pelvic floor.  Baseline:  Goal status: INITIAL    LONG TERM GOALS: Target date: 12/05/2022   Patient independent with advanced HEP for stretches and pelvic floor relaxation.  Baseline:  Goal status: INITIAL  2.  Patient reports her lower abdominal pain decreased >/= 80% due to reduction of pelvic floor tightness.  Baseline:  Goal status: INITIAL  3.  Patient reports she is able to urinate with discomfort due to restrictions around the bladder are resolved.  Baseline:  Goal status: INITIAL   PLAN:  PT FREQUENCY: 1x/week  PT DURATION: 12 weeks  PLANNED INTERVENTIONS: Therapeutic exercises, Therapeutic activity, Neuromuscular re-education, Patient/Family education, Self Care, Dry Needling, Electrical stimulation, Moist heat, Biofeedback, and Manual therapy  PLAN FOR NEXT SESSION: manual work to scar on abdomen, pelvic floor muscles, sides of bladder, diaphragmatic breathing with pelvic drop, see how the wand is going   Keileigh Vahey, PT 09/12/2022, 9:27 AMmale

## 2022-09-12 ENCOUNTER — Ambulatory Visit: Payer: PPO | Attending: Family Medicine | Admitting: Physical Therapy

## 2022-09-12 ENCOUNTER — Encounter: Payer: Self-pay | Admitting: Physical Therapy

## 2022-09-12 ENCOUNTER — Other Ambulatory Visit: Payer: Self-pay

## 2022-09-12 DIAGNOSIS — N3010 Interstitial cystitis (chronic) without hematuria: Secondary | ICD-10-CM | POA: Diagnosis not present

## 2022-09-12 DIAGNOSIS — J3089 Other allergic rhinitis: Secondary | ICD-10-CM | POA: Diagnosis not present

## 2022-09-12 DIAGNOSIS — R1030 Lower abdominal pain, unspecified: Secondary | ICD-10-CM

## 2022-09-12 DIAGNOSIS — J301 Allergic rhinitis due to pollen: Secondary | ICD-10-CM | POA: Diagnosis not present

## 2022-09-12 DIAGNOSIS — R252 Cramp and spasm: Secondary | ICD-10-CM | POA: Diagnosis not present

## 2022-09-12 DIAGNOSIS — J3081 Allergic rhinitis due to animal (cat) (dog) hair and dander: Secondary | ICD-10-CM | POA: Diagnosis not present

## 2022-09-13 DIAGNOSIS — M25569 Pain in unspecified knee: Secondary | ICD-10-CM | POA: Diagnosis not present

## 2022-09-17 DIAGNOSIS — M25569 Pain in unspecified knee: Secondary | ICD-10-CM | POA: Diagnosis not present

## 2022-09-17 DIAGNOSIS — J3081 Allergic rhinitis due to animal (cat) (dog) hair and dander: Secondary | ICD-10-CM | POA: Diagnosis not present

## 2022-09-17 DIAGNOSIS — J301 Allergic rhinitis due to pollen: Secondary | ICD-10-CM | POA: Diagnosis not present

## 2022-09-17 DIAGNOSIS — J3089 Other allergic rhinitis: Secondary | ICD-10-CM | POA: Diagnosis not present

## 2022-09-20 DIAGNOSIS — M25569 Pain in unspecified knee: Secondary | ICD-10-CM | POA: Diagnosis not present

## 2022-09-24 DIAGNOSIS — J301 Allergic rhinitis due to pollen: Secondary | ICD-10-CM | POA: Diagnosis not present

## 2022-09-24 DIAGNOSIS — J3081 Allergic rhinitis due to animal (cat) (dog) hair and dander: Secondary | ICD-10-CM | POA: Diagnosis not present

## 2022-09-24 DIAGNOSIS — M25569 Pain in unspecified knee: Secondary | ICD-10-CM | POA: Diagnosis not present

## 2022-09-24 DIAGNOSIS — J3089 Other allergic rhinitis: Secondary | ICD-10-CM | POA: Diagnosis not present

## 2022-09-27 DIAGNOSIS — M25569 Pain in unspecified knee: Secondary | ICD-10-CM | POA: Diagnosis not present

## 2022-10-03 DIAGNOSIS — M25569 Pain in unspecified knee: Secondary | ICD-10-CM | POA: Diagnosis not present

## 2022-10-08 DIAGNOSIS — J3089 Other allergic rhinitis: Secondary | ICD-10-CM | POA: Diagnosis not present

## 2022-10-08 DIAGNOSIS — J301 Allergic rhinitis due to pollen: Secondary | ICD-10-CM | POA: Diagnosis not present

## 2022-10-08 DIAGNOSIS — M25569 Pain in unspecified knee: Secondary | ICD-10-CM | POA: Diagnosis not present

## 2022-10-08 DIAGNOSIS — J3081 Allergic rhinitis due to animal (cat) (dog) hair and dander: Secondary | ICD-10-CM | POA: Diagnosis not present

## 2022-10-09 ENCOUNTER — Ambulatory Visit (HOSPITAL_BASED_OUTPATIENT_CLINIC_OR_DEPARTMENT_OTHER): Payer: PPO | Admitting: Obstetrics & Gynecology

## 2022-10-10 ENCOUNTER — Encounter: Payer: Self-pay | Admitting: Physical Therapy

## 2022-10-10 ENCOUNTER — Ambulatory Visit: Payer: PPO | Attending: Family Medicine | Admitting: Physical Therapy

## 2022-10-10 DIAGNOSIS — R252 Cramp and spasm: Secondary | ICD-10-CM | POA: Diagnosis not present

## 2022-10-10 DIAGNOSIS — R1030 Lower abdominal pain, unspecified: Secondary | ICD-10-CM | POA: Insufficient documentation

## 2022-10-10 NOTE — Therapy (Signed)
OUTPATIENT PHYSICAL THERAPY TREATMENT NOTE   Patient Name: Kristin Andersen MRN: [id number] DOB:21-May-1956, 66 y.o., female Today's Date: 10/10/2022  PCP: Mayra Neer, MD  REFERRING PROVIDER: Mayra Neer, MD   END OF SESSION:   PT End of Session - 10/10/22 0847     Visit Number 2    Date for PT Re-Evaluation 12/05/22    Authorization Type health team    Authorization - Visit Number 2    Authorization - Number of Visits 10    PT Start Time 0845    PT Stop Time 0925    PT Time Calculation (min) 40 min    Activity Tolerance Patient tolerated treatment well;No increased pain    Behavior During Therapy WFL for tasks assessed/performed             Past Medical History:  Diagnosis Date   Anxiety    Arthritis    in knee   GERD (gastroesophageal reflux disease)    Heart palpitations    Due to anxiety    Hypothyroidism    IC (interstitial cystitis)    Myofascial pain dysfunction syndrome    Right side   Osteoporosis    Stomach ulcer    Thyroid disease    Past Surgical History:  Procedure Laterality Date   CERVICAL CONE BIOPSY     around age 22   [place] Right 11/30/2021   Procedure: COMPUTER ASSISTED TOTAL KNEE ARTHROPLASTY;  Surgeon: Rod Can, MD;  Location: [facility];  Service: Orthopedics;  Laterality: Right;   LIPOMA EXCISION Right 07/09/2017   Leg    ROOT CANAL  11/13   3 on 2 teeth   ROOT CANAL  04/16/14 and 04/27/14   x2   TONSILLECTOMY     Patient Active Problem List   Diagnosis Date Noted   Osteoarthritis of right knee 11/30/2021   Tear of right hamstring 05/30/2017   Abnormal leg finding 07/07/2015   Myofascial pain dysfunction syndrome 11/21/2012   Piriformis syndrome of right side 06/20/2012   Nonallopathic lesion of lumbosacral region 06/20/2012   Plantar fasciitis 04/17/2011   Acquired hallux rigidus 04/17/2011    REFERRING DIAG: Interstitial Cystitis N30.10   THERAPY DIAG:  Cramp and spasm   Lower abdominal pain   Rationale for Evaluation and Treatment Rehabilitation   ONSET DATE: 05/2022   SUBJECTIVE:  SUBJECTIVE STATEMENT: I have my dilators.  Last visit helped. I had one day of very painful touch of my urethra. When I feel like I have to go to the bathroom I have to go. I have not used the wand yet this time.      PAIN:  Are you having pain? Yes NPRS scale: 2/10 Pain location:  lower abdominal area   Pain type: aching and tight Pain description: constant    Aggravating factors: random Relieving factors: TENS   PRECAUTIONS: None   WEIGHT BEARING RESTRICTIONS: No   FALLS:  Has patient fallen in last 6 months? No   LIVING ENVIRONMENT: Lives with: lives with their spouse   OCCUPATION: retired   PLOF: Independent   PATIENT GOALS: get rid of pain   PERTINENT HISTORY:  Interstitial Cystitis; C-section x2   BOWEL MOVEMENT No issues    URINATION: Pain with urination: Yes, discomfort Fully empty bladder: Yes:   Stream: Strong Urgency: No Frequency: every 2-3 hours Leakage:  none Pads: No   INTERCOURSE: Pain with intercourse:  none   PREGNANCY: Vaginal deliveries 2   OBJECTIVE:    DIAGNOSTIC FINDINGS:  CT scan negative of pelvis      COGNITION: Overall cognitive status: Within functional limits for tasks assessed                          SENSATION: Light touch: Appears intact Proprioception: Appears intact     PELVIC ALIGNMENT: ASIS are equal    LUMBARAROM/PROM: Lumbar ROM is full   Bilateral hip ROM is full.      PALPATION:   General  Patient has tenderness located in right suprapubic area, tightness in the scar along the midline of the lower abdomen. Tightness  in the hip adductors and tenderness located at the insertion.                  External Perineal Exam intact and good coloring                             Internal Pelvic Floor tenderness located in bilateral obturator internist, iliococcygeus, and sides of the bladder   Patient confirms identification and approves PT to assess internal pelvic floor and treatment Yes   PELVIC MMT: Not tested due to tightness in the muscles and no urinary leakage.    MMT eval  Vaginal    Internal Anal Sphincter    External Anal Sphincter    Puborectalis    Diastasis Recti    (Blank rows = not tested)         TONE: increased     TODAY'S TREATMENT:     10/10/2022 Manual: Soft tissue mobilization: Circular massage to the abdomen to feel for the fascial restrictions Scar tissue mobilization: Scar massage on the lower abdomen to improve the mobility Myofascial release: Release of the lower and upper abdomen to go through the restrictions Tissue rolling of the abdomen Release of the urachus ligament Release along the suprapubic bone Release of the mesenteric root      PATIENT EDUCATION:  Education details: sent patient a you tube video to work with her vaginal wand  Person educated: Patient Education method: Explanation and you tube video Education comprehension: verbalized understanding   HOME EXERCISE PROGRAM: See above.    ASSESSMENT:   CLINICAL IMPRESSION: Patient is a 66 y.o. female  who was seen today for physical  therapy evaluation and treatment for interstitial cystitis. Patient had improved mobility of the abdomen and lower abdominal scar after manual work. She was able to feel the gas in her abdomen due to release of the restrictions. Patient will benefit from skilled therapy to improve tissue mobility and reduce her pain.    OBJECTIVE IMPAIRMENTS: increased fascial restrictions, increased muscle spasms, and pain.    ACTIVITY LIMITATIONS: toileting   PARTICIPATION  LIMITATIONS: shopping and community activity   PERSONAL FACTORS: 1-2 comorbidities: interstitial cystitis, right TKR  are also affecting patient's functional outcome.    REHAB POTENTIAL: Excellent   CLINICAL DECISION MAKING: Stable/uncomplicated   EVALUATION COMPLEXITY: Low     GOALS: Goals reviewed with patient? Yes   SHORT TERM GOALS: Target date: 10/10/2022   Patient is using her vaginal wand on the pelvic floor muscles to reduce the tightness and trigger points.  Baseline: Goal status: INITIAL   2.  Patient reports her abdominal pain decreased >/= 25% due to improve tissue mobility of the lower abdomen and pelvic floor muscles.  Baseline:  Goal status: INITIAL   3.  Patient independent with diaphragmatic breathing with pelvic drop to elongate the pelvic floor.  Baseline:  Goal status: INITIAL       LONG TERM GOALS: Target date: 12/05/2022    Patient independent with advanced HEP for stretches and pelvic floor relaxation.  Baseline:  Goal status: INITIAL   2.  Patient reports her lower abdominal pain decreased >/= 80% due to reduction of pelvic floor tightness.  Baseline:  Goal status: INITIAL   3.  Patient reports she is able to urinate with discomfort due to restrictions around the bladder are resolved.  Baseline:  Goal status: INITIAL     PLAN:   PT FREQUENCY: 1x/week   PT DURATION: 12 weeks   PLANNED INTERVENTIONS: Therapeutic exercises, Therapeutic activity, Neuromuscular re-education, Patient/Family education, Self Care, Dry Needling, Electrical stimulation, Moist heat, Biofeedback, and Manual therapy   PLAN FOR NEXT SESSION: manual work to scar on abdomen, pelvic floor muscles, sides of bladder, diaphragmatic breathing with pelvic drop,educate on the wand Earlie Counts, PT 10/10/22 9:31 AM

## 2022-10-17 ENCOUNTER — Ambulatory Visit: Payer: PPO | Admitting: Physical Therapy

## 2022-10-17 ENCOUNTER — Encounter: Payer: Self-pay | Admitting: Physical Therapy

## 2022-10-17 DIAGNOSIS — R252 Cramp and spasm: Secondary | ICD-10-CM

## 2022-10-17 DIAGNOSIS — R1030 Lower abdominal pain, unspecified: Secondary | ICD-10-CM

## 2022-10-17 NOTE — Therapy (Signed)
OUTPATIENT PHYSICAL THERAPY TREATMENT NOTE   Patient Name: Kristin Andersen MRN: [id number] DOB:1956-07-08, 66 y.o., female Today's Date: 10/17/2022  PCP: Mayra Neer, MD  REFERRING PROVIDER: Mayra Neer, MD   END OF SESSION:   PT End of Session - 10/17/22 0847     Visit Number 3    Date for PT Re-Evaluation 12/05/22    Authorization Type 3    Authorization - Visit Number 3    Authorization - Number of Visits 10    PT Start Time 0845    PT Stop Time 0925    PT Time Calculation (min) 40 min    Activity Tolerance Patient tolerated treatment well;No increased pain    Behavior During Therapy WFL for tasks assessed/performed             Past Medical History:  Diagnosis Date   Anxiety    Arthritis    in knee   GERD (gastroesophageal reflux disease)    Heart palpitations    Due to anxiety    Hypothyroidism    IC (interstitial cystitis)    Myofascial pain dysfunction syndrome    Right side   Osteoporosis    Stomach ulcer    Thyroid disease    Past Surgical History:  Procedure Laterality Date   CERVICAL CONE BIOPSY     around age 63   [place] Right 11/30/2021   Procedure: COMPUTER ASSISTED TOTAL KNEE ARTHROPLASTY;  Surgeon: Rod Can, MD;  Location: [facility];  Service: Orthopedics;  Laterality: Right;   LIPOMA EXCISION Right 07/09/2017   Leg    ROOT CANAL  11/13   3 on 2 teeth   ROOT CANAL  04/16/14 and 04/27/14   x2   TONSILLECTOMY     Patient Active Problem List   Diagnosis Date Noted   Osteoarthritis of right knee 11/30/2021   Tear of right hamstring 05/30/2017   Abnormal leg finding 07/07/2015   Myofascial pain dysfunction syndrome 11/21/2012   Piriformis syndrome of right side 06/20/2012   Nonallopathic lesion of lumbosacral region 06/20/2012   Plantar fasciitis 04/17/2011   Acquired hallux rigidus 04/17/2011   REFERRING  DIAG: Interstitial Cystitis N30.10   THERAPY DIAG:  Cramp and spasm   Lower abdominal pain   Rationale for Evaluation and Treatment Rehabilitation   ONSET DATE: 05/2022   SUBJECTIVE:  SUBJECTIVE STATEMENT: I forgot my wand. I felt better after last visit. I did not had the cramping. I did not use my heating pad. The urethra pain is better and the pain on the perineal is less.       PAIN:  Are you having pain? Yes NPRS scale: 1/10 Pain location:  outside of the perineal area  Pain type: aching and tight Pain description: discomfort  Aggravating factors: no panty liner Relieving factors: panty liner   PRECAUTIONS: None   WEIGHT BEARING RESTRICTIONS: No   FALLS:  Has patient fallen in last 6 months? No   LIVING ENVIRONMENT: Lives with: lives with their spouse   OCCUPATION: retired   PLOF: Independent   PATIENT GOALS: get rid of pain   PERTINENT HISTORY:  Interstitial Cystitis; C-section x2   BOWEL MOVEMENT No issues    URINATION: Pain with urination: Yes, discomfort Fully empty bladder: Yes:   Stream: Strong Urgency: No Frequency: every 2-3 hours Leakage:  none Pads: No   INTERCOURSE: Pain with intercourse:  none   PREGNANCY: Vaginal deliveries 2      OBJECTIVE: (objective measures completed at initial evaluation unless otherwise dated)   (Copy Eval's Objective through Plan section here) DIAGNOSTIC FINDINGS:  CT scan negative of pelvis      COGNITION: Overall cognitive status: Within functional limits for tasks assessed                          SENSATION: Light touch: Appears intact Proprioception: Appears intact     PELVIC ALIGNMENT: ASIS are equal    LUMBARAROM/PROM: Lumbar ROM is full   Bilateral hip ROM is full.      PALPATION:   General  Patient  has tenderness located in right suprapubic area, tightness in the scar along the midline of the lower abdomen. Tightness in the hip adductors and tenderness located at the insertion.                  External Perineal Exam intact and good coloring                             Internal Pelvic Floor tenderness located in bilateral obturator internist, iliococcygeus, and sides of the bladder   Patient confirms identification and approves PT to assess internal pelvic floor and treatment Yes   PELVIC MMT: Not tested due to tightness in the muscles and no urinary leakage.    MMT eval  Vaginal    Internal Anal Sphincter    External Anal Sphincter    Puborectalis    Diastasis Recti    (Blank rows = not tested)         TONE: increased     TODAY'S TREATMENT:     10/17/2022 Manual: Scar tissue mobilization: Manual work around the lower abdominal scar to improve mobility and elongation Myofascial release:  Along the pubic rami and outside of the labia majora Release on the sides of the lower abdominal scar to go through the fascial restrictions Educated patient to massage the estrogen cream into the walls of the vagina and along the urethra Spinal mobilization: Internal pelvic floor techniques:No emotional/communication barriers or cognitive limitation. Patient is motivated to learn. Patient understands and agrees with treatment goals and plan. PT explains patient will be examined in standing, sitting, and lying down to see how their muscles and joints work. When they are ready, they will be  asked to remove their underwear so PT can examine their perineum. The patient is also given the option of providing their own chaperone as one is not provided in our facility. The patient also has the right and is explained the right to defer or refuse any part of the evaluation or treatment including the internal exam. With the patient's consent, PT will use one gloved finger to gently assess the muscles of  the pelvic floor, seeing how well it contracts and relaxes and if there is muscle symmetry. After, the patient will get dressed and PT and patient will discuss exam findings and plan of care. PT and patient discuss plan of care, schedule, attendance policy and HEP activities.  Working on the outside of the perineal area    10/10/2022 Manual: Soft tissue mobilization: Circular massage to the abdomen to feel for the fascial restrictions Scar tissue mobilization: Scar massage on the lower abdomen to improve the mobility Myofascial release: Release of the lower and upper abdomen to go through the restrictions Tissue rolling of the abdomen Release of the urachus ligament Release along the suprapubic bone Release of the mesenteric root      PATIENT EDUCATION:  Education details: sent patient a you tube video to work with her vaginal wand  Person educated: Patient Education method: Explanation and you tube video Education comprehension: verbalized understanding   HOME EXERCISE PROGRAM: See above.    ASSESSMENT:   CLINICAL IMPRESSION: Patient is a 66 y.o. female  who was seen today for physical therapy evaluation and treatment for interstitial cystitis. Patient is not having the ureteral pain or abdominal pain. Patient continues to have restrictions in the lower abdominal scar. She has tightness along the pubic rami. She is having the discomfort on the outside of the urethral and wearing a pantyliner reduces the pain. Patient will benefit from skilled therapy to improve tissue mobility and reduce her pain.     OBJECTIVE IMPAIRMENTS: increased fascial restrictions, increased muscle spasms, and pain.    ACTIVITY LIMITATIONS: toileting   PARTICIPATION LIMITATIONS: shopping and community activity   PERSONAL FACTORS: 1-2 comorbidities: interstitial cystitis, right TKR  are also affecting patient's functional outcome.    REHAB POTENTIAL: Excellent   CLINICAL DECISION MAKING:  Stable/uncomplicated   EVALUATION COMPLEXITY: Low     GOALS: Goals reviewed with patient? Yes   SHORT TERM GOALS: Target date: 10/10/2022   Patient is using her vaginal wand on the pelvic floor muscles to reduce the tightness and trigger points.  Baseline: Goal status: INITIAL   2.  Patient reports her abdominal pain decreased >/= 25% due to improve tissue mobility of the lower abdomen and pelvic floor muscles.  Baseline:  Goal status: Met 10/17/2022   3.  Patient independent with diaphragmatic breathing with pelvic drop to elongate the pelvic floor.  Baseline:  Goal status: Met 10/17/2022       LONG TERM GOALS: Target date: 12/05/2022    Patient independent with advanced HEP for stretches and pelvic floor relaxation.  Baseline:  Goal status: INITIAL   2.  Patient reports her lower abdominal pain decreased >/= 80% due to reduction of pelvic floor tightness.  Baseline:  Goal status: INITIAL   3.  Patient reports she is able to urinate with discomfort due to restrictions around the bladder are resolved.  Baseline:  Goal status: INITIAL     PLAN:   PT FREQUENCY: 1x/week   PT DURATION: 12 weeks   PLANNED INTERVENTIONS: Therapeutic exercises, Therapeutic activity, Neuromuscular re-education,  Patient/Family education, Self Care, Dry Needling, Electrical stimulation, Moist heat, Biofeedback, and Manual therapy   PLAN FOR NEXT SESSION: manual work to scar on abdomen, educate patient on scar massage pelvic floor muscles, sides of bladder, diaphragmatic breathing with pelvic drop,educate on the wand  Earlie Counts, PT 10/17/22 9:28 AM

## 2022-10-18 DIAGNOSIS — M25569 Pain in unspecified knee: Secondary | ICD-10-CM | POA: Diagnosis not present

## 2022-10-22 ENCOUNTER — Encounter: Payer: Self-pay | Admitting: Physical Therapy

## 2022-10-22 ENCOUNTER — Ambulatory Visit: Payer: PPO | Attending: Family Medicine | Admitting: Physical Therapy

## 2022-10-22 DIAGNOSIS — R1030 Lower abdominal pain, unspecified: Secondary | ICD-10-CM | POA: Diagnosis not present

## 2022-10-22 DIAGNOSIS — R252 Cramp and spasm: Secondary | ICD-10-CM | POA: Diagnosis not present

## 2022-10-22 NOTE — Therapy (Signed)
OUTPATIENT PHYSICAL THERAPY TREATMENT NOTE   Patient Name: Kristin Andersen MRN: [id number] DOB:03-15-1956, 66 y.o., female Today's Date: 10/22/2022  PCP: Mayra Neer, MD   REFERRING PROVIDER: Mayra Neer, MD    END OF SESSION:   PT End of Session - 10/22/22 1014     Visit Number 4    Date for PT Re-Evaluation 12/05/22    Authorization Type 4    Authorization - Visit Number 4    Authorization - Number of Visits 10    PT Start Time 3361    PT Stop Time 1055    PT Time Calculation (min) 40 min    Activity Tolerance Patient tolerated treatment well;No increased pain    Behavior During Therapy WFL for tasks assessed/performed             Past Medical History:  Diagnosis Date   Anxiety    Arthritis    in knee   GERD (gastroesophageal reflux disease)    Heart palpitations    Due to anxiety    Hypothyroidism    IC (interstitial cystitis)    Myofascial pain dysfunction syndrome    Right side   Osteoporosis    Stomach ulcer    Thyroid disease    Past Surgical History:  Procedure Laterality Date   CERVICAL CONE BIOPSY     around age 89   [place] Right 11/30/2021   Procedure: COMPUTER ASSISTED TOTAL KNEE ARTHROPLASTY;  Surgeon: Rod Can, MD;  Location: [facility];  Service: Orthopedics;  Laterality: Right;   LIPOMA EXCISION Right 07/09/2017   Leg    ROOT CANAL  11/13   3 on 2 teeth   ROOT CANAL  04/16/14 and 04/27/14   x2   TONSILLECTOMY     Patient Active Problem List   Diagnosis Date Noted   Osteoarthritis of right knee 11/30/2021   Tear of right hamstring 05/30/2017   Abnormal leg finding 07/07/2015   Myofascial pain dysfunction syndrome 11/21/2012   Piriformis syndrome of right side 06/20/2012   Nonallopathic lesion of lumbosacral region 06/20/2012   Plantar fasciitis 04/17/2011   Acquired hallux rigidus 04/17/2011   REFERRING  DIAG: Interstitial Cystitis N30.10   THERAPY DIAG:  Cramp and spasm   Lower abdominal pain   Rationale for Evaluation and Treatment Rehabilitation   ONSET DATE: 05/2022   SUBJECTIVE:  SUBJECTIVE STATEMENT:   I am feeling good. I have no abdominal pain. I am taking the Linzess to prepare for the colonoscopy. I still have the urethral pain and need to wear a pantyliner. I am using the estrogen cream on the urethra.      PAIN:  Are you having pain? Yes NPRS scale: 1/10 Pain location:  outside of the perineal area  Pain type: aching and tight Pain description: discomfort  Aggravating factors: no panty liner Relieving factors: panty liner   PRECAUTIONS: None   WEIGHT BEARING RESTRICTIONS: No   FALLS:  Has patient fallen in last 6 months? No   LIVING ENVIRONMENT: Lives with: lives with their spouse   OCCUPATION: retired   PLOF: Independent   PATIENT GOALS: get rid of pain   PERTINENT HISTORY:  Interstitial Cystitis; C-section x2   BOWEL MOVEMENT No issues    URINATION: Pain with urination: Yes, discomfort Fully empty bladder: Yes:   Stream: Strong Urgency: No Frequency: every 2-3 hours Leakage:  none Pads: No   INTERCOURSE: Pain with intercourse:  none   PREGNANCY: Vaginal deliveries 2      OBJECTIVE: (objective measures completed at initial evaluation unless otherwise dated)     (Copy Eval's Objective through Plan section here) DIAGNOSTIC FINDINGS:  CT scan negative of pelvis      COGNITION: Overall cognitive status: Within functional limits for tasks assessed                          SENSATION: Light touch: Appears intact Proprioception: Appears intact     PELVIC ALIGNMENT: ASIS are equal    LUMBARAROM/PROM: Lumbar ROM is full   Bilateral hip ROM is full.       PALPATION:   General  Patient has tenderness located in right suprapubic area, tightness in the scar along the midline of the lower abdomen. Tightness in the hip adductors and tenderness located at the insertion.                  External Perineal Exam intact and good coloring                             Internal Pelvic Floor tenderness located in bilateral obturator internist, iliococcygeus, and sides of the bladder   Patient confirms identification and approves PT to assess internal pelvic floor and treatment Yes   PELVIC MMT: Not tested due to tightness in the muscles and no urinary leakage.    MMT eval  Vaginal    Internal Anal Sphincter    External Anal Sphincter    Puborectalis    Diastasis Recti    (Blank rows = not tested)         TONE: increased     TODAY'S TREATMENT:     10/22/2022 Manual: Scar tissue mobilization: Manual work around the lower abdominal scar to improve mobility and elongation Internal pelvic floor techniques:No emotional/communication barriers or cognitive limitation. Patient is motivated to learn. Patient understands and agrees with treatment goals and plan. PT explains patient will be examined in standing, sitting, and lying down to see how their muscles and joints work. When they are ready, they will be asked to remove their underwear so PT can examine their perineum. The patient is also given the option of providing their own chaperone as one is not provided in our facility. The patient also has the right and is explained  the right to defer or refuse any part of the evaluation or treatment including the internal exam. With the patient's consent, PT will use one gloved finger to gently assess the muscles of the pelvic floor, seeing how well it contracts and relaxes and if there is muscle symmetry. After, the patient will get dressed and PT and patient will discuss exam findings and plan of care. PT and patient discuss plan of care, schedule,  attendance policy and HEP activities.  Manual work to the introitus to elongate, fascial release to the sides of the urethra and bladder, fascial work on the urethra internally while the external hand was supra pubically to further release Educated patient on how to use the vaginal wand internally to perform trigger point work to the pelvic floor muscles and elongate. Therapist placed the vaginal wand into the vaginal canal and released the pelvic floor muscles so patient would understand how it feels to do on her own.      10/17/2022 Manual: Scar tissue mobilization: Manual work around the lower abdominal scar to improve mobility and elongation Myofascial release:  Along the pubic rami and outside of the labia majora Release on the sides of the lower abdominal scar to go through the fascial restrictions Educated patient to massage the estrogen cream into the walls of the vagina and along the urethra Spinal mobilization: Internal pelvic floor techniques:No emotional/communication barriers or cognitive limitation. Patient is motivated to learn. Patient understands and agrees with treatment goals and plan. PT explains patient will be examined in standing, sitting, and lying down to see how their muscles and joints work. When they are ready, they will be asked to remove their underwear so PT can examine their perineum. The patient is also given the option of providing their own chaperone as one is not provided in our facility. The patient also has the right and is explained the right to defer or refuse any part of the evaluation or treatment including the internal exam. With the patient's consent, PT will use one gloved finger to gently assess the muscles of the pelvic floor, seeing how well it contracts and relaxes and if there is muscle symmetry. After, the patient will get dressed and PT and patient will discuss exam findings and plan of care. PT and patient discuss plan of care, schedule, attendance  policy and HEP activities.  Working on the outside of the perineal area    10/10/2022 Manual: Soft tissue mobilization: Circular massage to the abdomen to feel for the fascial restrictions Scar tissue mobilization: Scar massage on the lower abdomen to improve the mobility Myofascial release: Release of the lower and upper abdomen to go through the restrictions Tissue rolling of the abdomen Release of the urachus ligament Release along the suprapubic bone Release of the mesenteric root      PATIENT EDUCATION:  Education details: sent patient a you tube video to work with her vaginal wand  Person educated: Patient Education method: Explanation and you tube video Education comprehension: verbalized understanding   HOME EXERCISE PROGRAM: See above.    ASSESSMENT:   CLINICAL IMPRESSION: Patient is a 66 y.o. female  who was seen today for physical therapy  treatment for interstitial cystitis. Patient is not having abdominal pain. Patient continues to have restrictions in the lower abdominal scar. She has tightness along the ischiocavernosus, introitus and urogenital diaphragm. She had fascial restrictions around the urethra and bladder. Patient was educated on how to use the vaginal wand. She had no pain after  the manual work.  Patient will benefit from skilled therapy to improve tissue mobility and reduce her pain.     OBJECTIVE IMPAIRMENTS: increased fascial restrictions, increased muscle spasms, and pain.    ACTIVITY LIMITATIONS: toileting   PARTICIPATION LIMITATIONS: shopping and community activity   PERSONAL FACTORS: 1-2 comorbidities: interstitial cystitis, right TKR  are also affecting patient's functional outcome.    REHAB POTENTIAL: Excellent   CLINICAL DECISION MAKING: Stable/uncomplicated   EVALUATION COMPLEXITY: Low     GOALS: Goals reviewed with patient? Yes   SHORT TERM GOALS: Target date: 10/10/2022   Patient is using her vaginal wand on the pelvic floor  muscles to reduce the tightness and trigger points.  Baseline: Goal status: Met 12/4/023  2.  Patient reports her abdominal pain decreased >/= 25% due to improve tissue mobility of the lower abdomen and pelvic floor muscles.  Baseline:  Goal status: Met 10/17/2022   3.  Patient independent with diaphragmatic breathing with pelvic drop to elongate the pelvic floor.  Baseline:  Goal status: Met 10/17/2022       LONG TERM GOALS: Target date: 12/05/2022    Patient independent with advanced HEP for stretches and pelvic floor relaxation.  Baseline:  Goal status: INITIAL   2.  Patient reports her lower abdominal pain decreased >/= 80% due to reduction of pelvic floor tightness.  Baseline:  Goal status: Met 10/22/2022   3.  Patient reports she is able to urinate with discomfort due to restrictions around the bladder are resolved.  Baseline:  Goal status: INITIAL     PLAN:   PT FREQUENCY: 1x/week   PT DURATION: 12 weeks   PLANNED INTERVENTIONS: Therapeutic exercises, Therapeutic activity, Neuromuscular re-education, Patient/Family education, Self Care, Dry Needling, Electrical stimulation, Moist heat, Biofeedback, and Manual therapy   PLAN FOR NEXT SESSION: manual work to scar on abdomen, educate patient on scar massage,  sides of bladder and urethra starting on left side, diaphragmatic breathing with pelvic drop,see how it is going with the  wand  Earlie Counts, PT 10/22/22 11:01 AM

## 2022-10-24 DIAGNOSIS — D125 Benign neoplasm of sigmoid colon: Secondary | ICD-10-CM | POA: Diagnosis not present

## 2022-10-24 DIAGNOSIS — Z86010 Personal history of colonic polyps: Secondary | ICD-10-CM | POA: Diagnosis not present

## 2022-10-24 DIAGNOSIS — K635 Polyp of colon: Secondary | ICD-10-CM | POA: Diagnosis not present

## 2022-10-24 DIAGNOSIS — K5730 Diverticulosis of large intestine without perforation or abscess without bleeding: Secondary | ICD-10-CM | POA: Diagnosis not present

## 2022-10-24 DIAGNOSIS — Z1211 Encounter for screening for malignant neoplasm of colon: Secondary | ICD-10-CM | POA: Diagnosis not present

## 2022-10-24 DIAGNOSIS — K648 Other hemorrhoids: Secondary | ICD-10-CM | POA: Diagnosis not present

## 2022-10-25 DIAGNOSIS — M25569 Pain in unspecified knee: Secondary | ICD-10-CM | POA: Diagnosis not present

## 2022-10-26 DIAGNOSIS — J301 Allergic rhinitis due to pollen: Secondary | ICD-10-CM | POA: Diagnosis not present

## 2022-10-26 DIAGNOSIS — J3089 Other allergic rhinitis: Secondary | ICD-10-CM | POA: Diagnosis not present

## 2022-10-26 DIAGNOSIS — K2100 Gastro-esophageal reflux disease with esophagitis, without bleeding: Secondary | ICD-10-CM | POA: Diagnosis not present

## 2022-10-26 DIAGNOSIS — J3081 Allergic rhinitis due to animal (cat) (dog) hair and dander: Secondary | ICD-10-CM | POA: Diagnosis not present

## 2022-10-29 ENCOUNTER — Ambulatory Visit: Payer: PPO | Admitting: Physical Therapy

## 2022-10-29 ENCOUNTER — Encounter: Payer: Self-pay | Admitting: Physical Therapy

## 2022-10-29 DIAGNOSIS — R252 Cramp and spasm: Secondary | ICD-10-CM | POA: Diagnosis not present

## 2022-10-29 DIAGNOSIS — R1030 Lower abdominal pain, unspecified: Secondary | ICD-10-CM

## 2022-10-29 NOTE — Therapy (Signed)
OUTPATIENT PHYSICAL THERAPY TREATMENT NOTE   Patient Name: Kristin Andersen MRN: [id number] DOB:1956/02/27, 66 y.o., female Today's Date: 10/29/2022  PCP: Mayra Neer, MD  REFERRING PROVIDER: Mayra Neer, MD   END OF SESSION:   PT End of Session - 10/29/22 1018     Visit Number 5    Date for PT Re-Evaluation 12/05/22    Authorization Type [employer]    Authorization - Visit Number 5    Authorization - Number of Visits 10    PT Start Time 8938    PT Stop Time 1055    PT Time Calculation (min) 40 min    Activity Tolerance Patient tolerated treatment well;No increased pain    Behavior During Therapy WFL for tasks assessed/performed             Past Medical History:  Diagnosis Date   Anxiety    Arthritis    in knee   GERD (gastroesophageal reflux disease)    Heart palpitations    Due to anxiety    Hypothyroidism    IC (interstitial cystitis)    Myofascial pain dysfunction syndrome    Right side   Osteoporosis    Stomach ulcer    Thyroid disease    Past Surgical History:  Procedure Laterality Date   CERVICAL CONE BIOPSY     around age 37   [place] Right 11/30/2021   Procedure: COMPUTER ASSISTED TOTAL KNEE ARTHROPLASTY;  Surgeon: Rod Can, MD;  Location: [facility];  Service: Orthopedics;  Laterality: Right;   LIPOMA EXCISION Right 07/09/2017   Leg    ROOT CANAL  11/13   3 on 2 teeth   ROOT CANAL  04/16/14 and 04/27/14   x2   TONSILLECTOMY     Patient Active Problem List   Diagnosis Date Noted   Osteoarthritis of right knee 11/30/2021   Tear of right hamstring 05/30/2017   Abnormal leg finding 07/07/2015   Myofascial pain dysfunction syndrome 11/21/2012   Piriformis syndrome of right side 06/20/2012   Nonallopathic lesion of lumbosacral region 06/20/2012   Plantar fasciitis 04/17/2011   Acquired hallux rigidus 04/17/2011    REFERRING DIAG: Interstitial Cystitis N30.10   THERAPY DIAG:  Cramp and spasm   Lower abdominal pain   Rationale for Evaluation and Treatment Rehabilitation   ONSET DATE: 05/2022   SUBJECTIVE:  SUBJECTIVE STATEMENT:   I had my colonoscopy and it went well. I have not used the wand yet due to feeling well. I am not having pain. I still have some urethra discomfort. I am not using the Urabel this week.     PAIN:  Are you having pain? Yes NPRS scale: 1/10 Pain location:  outside of the perineal area  Pain type: aching and tight Pain description: discomfort  Aggravating factors: no panty liner Relieving factors: panty liner   PRECAUTIONS: None   WEIGHT BEARING RESTRICTIONS: No   FALLS:  Has patient fallen in last 6 months? No   LIVING ENVIRONMENT: Lives with: lives with their spouse   OCCUPATION: retired   PLOF: Independent   PATIENT GOALS: get rid of pain   PERTINENT HISTORY:  Interstitial Cystitis; C-section x2   BOWEL MOVEMENT No issues    URINATION: Pain with urination: no  Fully empty bladder: Yes:   Stream: Strong Urgency: No Frequency: every 2-3 hours Leakage:  none Pads: No   INTERCOURSE: Pain with intercourse:  none   PREGNANCY: Vaginal deliveries 2      OBJECTIVE: (objective measures completed at initial evaluation unless otherwise dated)     DIAGNOSTIC FINDINGS:  CT scan negative of pelvis      COGNITION: Overall cognitive status: Within functional limits for tasks assessed                          SENSATION: Light touch: Appears intact Proprioception: Appears intact     PELVIC ALIGNMENT: ASIS are equal    LUMBARAROM/PROM: Lumbar ROM is full   Bilateral hip ROM is full.      PALPATION:   General  Patient has tenderness located in right  suprapubic area, tightness in the scar along the midline of the lower abdomen. Tightness in the hip adductors and tenderness located at the insertion.                  External Perineal Exam intact and good coloring                             Internal Pelvic Floor tenderness located in bilateral obturator internist, iliococcygeus, and sides of the bladder   Patient confirms identification and approves PT to assess internal pelvic floor and treatment Yes   PELVIC MMT: Not tested due to tightness in the muscles and no urinary leakage.    MMT eval  Vaginal    Internal Anal Sphincter    External Anal Sphincter    Puborectalis    Diastasis Recti    (Blank rows = not tested)         TONE: increased     TODAY'S TREATMENT:     10/29/2022 Manual: Soft tissue mobilization: Along the sides of the scar to improve tissue mobility Scar tissue mobilization: Along the scar and educated patient on scar massage to improve the mobility Myofascial release: Lifting of the scar and going through the restrictions to improve the mobility Tissue rolling along the abdominals     10/22/2022 Manual: Scar tissue mobilization: Manual work around the lower abdominal scar to improve mobility and elongation Internal pelvic floor techniques:No emotional/communication barriers or cognitive limitation. Patient is motivated to learn. Patient understands and agrees with treatment goals and plan. PT explains patient will be examined in standing, sitting, and lying down to see how their muscles and joints work. When they  are ready, they will be asked to remove their underwear so PT can examine their perineum. The patient is also given the option of providing their own chaperone as one is not provided in our facility. The patient also has the right and is explained the right to defer or refuse any part of the evaluation or treatment including the internal exam. With the patient's consent, PT will use one gloved finger  to gently assess the muscles of the pelvic floor, seeing how well it contracts and relaxes and if there is muscle symmetry. After, the patient will get dressed and PT and patient will discuss exam findings and plan of care. PT and patient discuss plan of care, schedule, attendance policy and HEP activities.  Manual work to the introitus to elongate, fascial release to the sides of the urethra and bladder, fascial work on the urethra internally while the external hand was supra pubically to further release Educated patient on how to use the vaginal wand internally to perform trigger point work to the pelvic floor muscles and elongate. Therapist placed the vaginal wand into the vaginal canal and released the pelvic floor muscles so patient would understand how it feels to do on her own.        10/17/2022 Manual: Scar tissue mobilization: Manual work around the lower abdominal scar to improve mobility and elongation Myofascial release:  Along the pubic rami and outside of the labia majora Release on the sides of the lower abdominal scar to go through the fascial restrictions Educated patient to massage the estrogen cream into the walls of the vagina and along the urethra Spinal mobilization: Internal pelvic floor techniques:No emotional/communication barriers or cognitive limitation. Patient is motivated to learn. Patient understands and agrees with treatment goals and plan. PT explains patient will be examined in standing, sitting, and lying down to see how their muscles and joints work. When they are ready, they will be asked to remove their underwear so PT can examine their perineum. The patient is also given the option of providing their own chaperone as one is not provided in our facility. The patient also has the right and is explained the right to defer or refuse any part of the evaluation or treatment including the internal exam. With the patient's consent, PT will use one gloved finger to gently  assess the muscles of the pelvic floor, seeing how well it contracts and relaxes and if there is muscle symmetry. After, the patient will get dressed and PT and patient will discuss exam findings and plan of care. PT and patient discuss plan of care, schedule, attendance policy and HEP activities.  Working on the outside of the perineal area      PATIENT EDUCATION:  Education details: sent patient a you tube video to work with her vaginal wand  Person educated: Patient Education method: Explanation and you tube video Education comprehension: verbalized understanding   HOME EXERCISE PROGRAM: See above.    ASSESSMENT:   CLINICAL IMPRESSION: Patient is a 66 y.o. female  who was seen today for physical therapy  treatment for interstitial cystitis. Patient is not having to use the Urabel. She is not having discomfort with urination. Patient understands how to mobilize her scar on the abdomen. Patient had pulling of the urethra with scar massage. Patient will benefit from skilled therapy to improve tissue mobility and reduce her pain.     OBJECTIVE IMPAIRMENTS: increased fascial restrictions, increased muscle spasms, and pain.    ACTIVITY LIMITATIONS: toileting  PARTICIPATION LIMITATIONS: shopping and community activity   PERSONAL FACTORS: 1-2 comorbidities: interstitial cystitis, right TKR  are also affecting patient's functional outcome.    REHAB POTENTIAL: Excellent   CLINICAL DECISION MAKING: Stable/uncomplicated   EVALUATION COMPLEXITY: Low     GOALS: Goals reviewed with patient? Yes   SHORT TERM GOALS: Target date: 10/10/2022   Patient is using her vaginal wand on the pelvic floor muscles to reduce the tightness and trigger points.  Baseline: Goal status: Met 12/4/023  2.  Patient reports her abdominal pain decreased >/= 25% due to improve tissue mobility of the lower abdomen and pelvic floor muscles.  Baseline:  Goal status: Met 10/17/2022   3.  Patient independent with  diaphragmatic breathing with pelvic drop to elongate the pelvic floor.  Baseline:  Goal status: Met 10/17/2022       LONG TERM GOALS: Target date: 12/05/2022    Patient independent with advanced HEP for stretches and pelvic floor relaxation.  Baseline:  Goal status: onging 10/29/2022   2.  Patient reports her lower abdominal pain decreased >/= 80% due to reduction of pelvic floor tightness.  Baseline:  Goal status: Met 10/22/2022   3.  Patient reports she is able to urinate with discomfort due to restrictions around the bladder are resolved.  Baseline:  Goal status: met 10/29/2022     PLAN:   PT FREQUENCY: 1x/week   PT DURATION: 12 weeks   PLANNED INTERVENTIONS: Therapeutic exercises, Therapeutic activity, Neuromuscular re-education, Patient/Family education, Self Care, Dry Needling, Electrical stimulation, Moist heat, Biofeedback, and Manual therapy   PLAN FOR NEXT SESSION: manual work to scar on abdomen, sides of bladder and urethra starting on left side, diaphragmatic breathing with pelvic drop Earlie Counts, PT 10/29/22 10:58 AM

## 2022-10-31 DIAGNOSIS — M25569 Pain in unspecified knee: Secondary | ICD-10-CM | POA: Diagnosis not present

## 2022-10-31 DIAGNOSIS — M79669 Pain in unspecified lower leg: Secondary | ICD-10-CM | POA: Diagnosis not present

## 2022-11-05 ENCOUNTER — Encounter: Payer: PPO | Admitting: Physical Therapy

## 2022-11-08 DIAGNOSIS — M79669 Pain in unspecified lower leg: Secondary | ICD-10-CM | POA: Diagnosis not present

## 2022-11-08 DIAGNOSIS — M25569 Pain in unspecified knee: Secondary | ICD-10-CM | POA: Diagnosis not present

## 2022-11-15 DIAGNOSIS — M79669 Pain in unspecified lower leg: Secondary | ICD-10-CM | POA: Diagnosis not present

## 2022-11-15 DIAGNOSIS — M25569 Pain in unspecified knee: Secondary | ICD-10-CM | POA: Diagnosis not present

## 2022-11-21 ENCOUNTER — Encounter: Payer: Self-pay | Admitting: Physical Therapy

## 2022-11-21 ENCOUNTER — Ambulatory Visit: Payer: PPO | Attending: Family Medicine | Admitting: Physical Therapy

## 2022-11-21 DIAGNOSIS — R1030 Lower abdominal pain, unspecified: Secondary | ICD-10-CM

## 2022-11-21 DIAGNOSIS — J301 Allergic rhinitis due to pollen: Secondary | ICD-10-CM | POA: Diagnosis not present

## 2022-11-21 DIAGNOSIS — J3081 Allergic rhinitis due to animal (cat) (dog) hair and dander: Secondary | ICD-10-CM | POA: Diagnosis not present

## 2022-11-21 DIAGNOSIS — R252 Cramp and spasm: Secondary | ICD-10-CM

## 2022-11-21 DIAGNOSIS — J3089 Other allergic rhinitis: Secondary | ICD-10-CM | POA: Diagnosis not present

## 2022-11-21 NOTE — Therapy (Signed)
OUTPATIENT PHYSICAL THERAPY TREATMENT NOTE   Patient Name: Kristin Andersen MRN: [id number] DOB:01/25/1956, 67 y.o., female Today's Date: 11/21/2022  PCP: Mayra Neer, MD  REFERRING PROVIDER: Mayra Neer, MD    END OF SESSION:   PT End of Session - 11/21/22 1015     Visit Number 6    Date for PT Re-Evaluation 12/05/22    Authorization Type [employer]    Authorization - Visit Number 6    Authorization - Number of Visits 10    PT Start Time 0160    PT Stop Time 1055    PT Time Calculation (min) 40 min    Activity Tolerance Patient tolerated treatment well;No increased pain    Behavior During Therapy WFL for tasks assessed/performed             Past Medical History:  Diagnosis Date   Anxiety    Arthritis    in knee   GERD (gastroesophageal reflux disease)    Heart palpitations    Due to anxiety    Hypothyroidism    IC (interstitial cystitis)    Myofascial pain dysfunction syndrome    Right side   Osteoporosis    Stomach ulcer    Thyroid disease    Past Surgical History:  Procedure Laterality Date   CERVICAL CONE BIOPSY     around age 89   [place] Right 11/30/2021   Procedure: COMPUTER ASSISTED TOTAL KNEE ARTHROPLASTY;  Surgeon: Rod Can, MD;  Location: [facility];  Service: Orthopedics;  Laterality: Right;   LIPOMA EXCISION Right 07/09/2017   Leg    ROOT CANAL  11/13   3 on 2 teeth   ROOT CANAL  04/16/14 and 04/27/14   x2   TONSILLECTOMY     Patient Active Problem List   Diagnosis Date Noted   Osteoarthritis of right knee 11/30/2021   Tear of right hamstring 05/30/2017   Abnormal leg finding 07/07/2015   Myofascial pain dysfunction syndrome 11/21/2012   Piriformis syndrome of right side 06/20/2012   Nonallopathic lesion of lumbosacral region 06/20/2012   Plantar fasciitis 04/17/2011   Acquired hallux rigidus 04/17/2011    REFERRING DIAG: Interstitial Cystitis N30.10   THERAPY DIAG:  Cramp and spasm   Lower abdominal pain   Rationale for Evaluation and Treatment Rehabilitation   ONSET DATE: 05/2022   SUBJECTIVE:  SUBJECTIVE STATEMENT:  I started to have increased pain yesterday. I have not used the vaginal wand. Trying to be comfortable with using it. I do not have the outer urethral pain just more internal.    PAIN:  Are you having pain? Yes NPRS scale: 4/10 Pain location:  outside of the perineal area  Pain type: aching and tight Pain description: discomfort  Aggravating factors: no panty liner Relieving factors: panty liner   PRECAUTIONS: None   WEIGHT BEARING RESTRICTIONS: No   FALLS:  Has patient fallen in last 6 months? No   LIVING ENVIRONMENT: Lives with: lives with their spouse   OCCUPATION: retired   PLOF: Independent   PATIENT GOALS: get rid of pain   PERTINENT HISTORY:  Interstitial Cystitis; C-section x2   BOWEL MOVEMENT No issues    URINATION: Pain with urination: Yes, less severe than yesterday Fully empty bladder: Yes:   Stream: Strong Urgency: No Frequency: every 30-40 minutes Leakage:  none Pads: No   INTERCOURSE: Pain with intercourse:  none   PREGNANCY: Vaginal deliveries 2      OBJECTIVE: (objective measures completed at initial evaluation unless otherwise dated)     DIAGNOSTIC FINDINGS:  CT scan negative of pelvis      COGNITION: Overall cognitive status: Within functional limits for tasks assessed                          SENSATION: Light touch: Appears intact Proprioception: Appears intact     PELVIC ALIGNMENT: ASIS are equal    LUMBARAROM/PROM: Lumbar ROM is full   Bilateral hip ROM is full.      PALPATION:   General  Patient has tenderness  located in right suprapubic area, tightness in the scar along the midline of the lower abdomen. Tightness in the hip adductors and tenderness located at the insertion.                  External Perineal Exam intact and good coloring                             Internal Pelvic Floor tenderness located in bilateral obturator internist, iliococcygeus, and sides of the bladder   Patient confirms identification and approves PT to assess internal pelvic floor and treatment Yes   PELVIC MMT: Not tested due to tightness in the muscles and no urinary leakage.    MMT eval  Vaginal    Internal Anal Sphincter    External Anal Sphincter    Puborectalis    Diastasis Recti    (Blank rows = not tested)         TONE: increased     TODAY'S TREATMENT:     11/21/2022 Manual: Myofascial release: Fascial work to the lower abdomen to release the restrictions suprapubically Internal pelvic floor techniques:No emotional/communication barriers or cognitive limitation. Patient is motivated to learn. Patient understands and agrees with treatment goals and plan. PT explains patient will be examined in standing, sitting, and lying down to see how their muscles and joints work. When they are ready, they will be asked to remove their underwear so PT can examine their perineum. The patient is also given the option of providing their own chaperone as one is not provided in our facility. The patient also has the right and is explained the right to defer or refuse any part of the evaluation or treatment including the internal exam.  With the patient's consent, PT will use one gloved finger to gently assess the muscles of the pelvic floor, seeing how well it contracts and relaxes and if there is muscle symmetry. After, the patient will get dressed and PT and patient will discuss exam findings and plan of care. PT and patient discuss plan of care, schedule, attendance policy and HEP activities.  Going through the vaginal  working around the urethra and bladder to release the restrictions and reduce her pain Patient had heat on her back while therapist was performing manual work.  Exercises: Stretches/mobility: Lay with legs up on the wall performing diaphragmatic breathing to relax the pelvic floor Educated patient to use her home TENS unit and vaginal wand to work with the pain management when she has flare ups    10/29/2022 Manual: Soft tissue mobilization: Along the sides of the scar to improve tissue mobility Scar tissue mobilization: Along the scar and educated patient on scar massage to improve the mobility Myofascial release: Lifting of the scar and going through the restrictions to improve the mobility Tissue rolling along the abdominals      10/22/2022 Manual: Scar tissue mobilization: Manual work around the lower abdominal scar to improve mobility and elongation Internal pelvic floor techniques:No emotional/communication barriers or cognitive limitation. Patient is motivated to learn. Patient understands and agrees with treatment goals and plan. PT explains patient will be examined in standing, sitting, and lying down to see how their muscles and joints work. When they are ready, they will be asked to remove their underwear so PT can examine their perineum. The patient is also given the option of providing their own chaperone as one is not provided in our facility. The patient also has the right and is explained the right to defer or refuse any part of the evaluation or treatment including the internal exam. With the patient's consent, PT will use one gloved finger to gently assess the muscles of the pelvic floor, seeing how well it contracts and relaxes and if there is muscle symmetry. After, the patient will get dressed and PT and patient will discuss exam findings and plan of care. PT and patient discuss plan of care, schedule, attendance policy and HEP activities.  Manual work to the introitus to  elongate, fascial release to the sides of the urethra and bladder, fascial work on the urethra internally while the external hand was supra pubically to further release Educated patient on how to use the vaginal wand internally to perform trigger point work to the pelvic floor muscles and elongate. Therapist placed the vaginal wand into the vaginal canal and released the pelvic floor muscles so patient would understand how it feels to do on her own.   PATIENT EDUCATION:  Education details: supine with legs up the wall with diaphragmatic breathing to relax the pelvic floor; going back to using her home TENS unit and vaginal wand Person educated: Patient Education method: Explanation and you tube video Education comprehension: verbalized understanding   HOME EXERCISE PROGRAM: See above.    ASSESSMENT:   CLINICAL IMPRESSION: Patient is a 67 y.o. female  who was seen today for physical therapy  treatment for interstitial cystitis. Patient has had a flare-up yesterday and not sure why it happened. Patient pain level decreased from a 6/10 to 2/10 after the manual work. She had restrictions around the bladder and urethra. Her pelvic floor was having difficulty with relaxation during the manual work. Patient will benefit from skilled therapy to improve tissue mobility  and reduce her pain.     OBJECTIVE IMPAIRMENTS: increased fascial restrictions, increased muscle spasms, and pain.    ACTIVITY LIMITATIONS: toileting   PARTICIPATION LIMITATIONS: shopping and community activity   PERSONAL FACTORS: 1-2 comorbidities: interstitial cystitis, right TKR  are also affecting patient's functional outcome.    REHAB POTENTIAL: Excellent   CLINICAL DECISION MAKING: Stable/uncomplicated   EVALUATION COMPLEXITY: Low     GOALS: Goals reviewed with patient? Yes   SHORT TERM GOALS: Target date: 10/10/2022   Patient is using her vaginal wand on the pelvic floor muscles to reduce the tightness and trigger  points.  Baseline: Goal status: Met 12/4/023  2.  Patient reports her abdominal pain decreased >/= 25% due to improve tissue mobility of the lower abdomen and pelvic floor muscles.  Baseline:  Goal status: Met 10/17/2022   3.  Patient independent with diaphragmatic breathing with pelvic drop to elongate the pelvic floor.  Baseline:  Goal status: Met 10/17/2022       LONG TERM GOALS: Target date: 12/05/2022    Patient independent with advanced HEP for stretches and pelvic floor relaxation.  Baseline:  Goal status: onging 10/29/2022   2.  Patient reports her lower abdominal pain decreased >/= 80% due to reduction of pelvic floor tightness.  Baseline:  Goal status: Met 10/22/2022   3.  Patient reports she is able to urinate with discomfort due to restrictions around the bladder are resolved.  Baseline:  Goal status: met 10/29/2022     PLAN:   PT FREQUENCY: 1x/week   PT DURATION: 12 weeks   PLANNED INTERVENTIONS: Therapeutic exercises, Therapeutic activity, Neuromuscular re-education, Patient/Family education, Self Care, Dry Needling, Electrical stimulation, Moist heat, Biofeedback, and Manual therapy   PLAN FOR NEXT SESSION: manual work to scar on abdomen, sides of bladder and urethra starting on left side, diaphragmatic breathing with pelvic drop, see if she has used the home TENS unit   Earlie Counts, PT 11/21/22 11:48 AM

## 2022-11-22 DIAGNOSIS — N3010 Interstitial cystitis (chronic) without hematuria: Secondary | ICD-10-CM | POA: Diagnosis not present

## 2022-11-26 ENCOUNTER — Encounter: Payer: Self-pay | Admitting: Physical Therapy

## 2022-11-26 ENCOUNTER — Ambulatory Visit: Payer: PPO | Admitting: Physical Therapy

## 2022-11-26 DIAGNOSIS — R1030 Lower abdominal pain, unspecified: Secondary | ICD-10-CM

## 2022-11-26 DIAGNOSIS — R252 Cramp and spasm: Secondary | ICD-10-CM | POA: Diagnosis not present

## 2022-11-26 NOTE — Therapy (Signed)
OUTPATIENT PHYSICAL THERAPY TREATMENT NOTE   Patient Name: Kristin Andersen MRN: [id number] DOB:[birth year], 67 y.o., female Today's Date: 11/26/2022  PCP: Mayra Neer, MD   REFERRING PROVIDER: Mayra Neer, MD     END OF SESSION:   PT End of Session - 11/26/22 1020     Visit Number 7    Date for PT Re-Evaluation 12/05/22    Authorization Type [employer]    Authorization - Visit Number 7    Authorization - Number of Visits 10    PT Start Time 0865    PT Stop Time 1055    PT Time Calculation (min) 40 min    Activity Tolerance Patient tolerated treatment well;No increased pain    Behavior During Therapy WFL for tasks assessed/performed             Past Medical History:  Diagnosis Date   Anxiety    Arthritis    in knee   GERD (gastroesophageal reflux disease)    Heart palpitations    Due to anxiety    Hypothyroidism    IC (interstitial cystitis)    Myofascial pain dysfunction syndrome    Right side   Osteoporosis    Stomach ulcer    Thyroid disease    Past Surgical History:  Procedure Laterality Date   CERVICAL CONE BIOPSY     around age 53   [place] Right 11/30/2021   Procedure: COMPUTER ASSISTED TOTAL KNEE ARTHROPLASTY;  Surgeon: Rod Can, MD;  Location: [facility];  Service: Orthopedics;  Laterality: Right;   LIPOMA EXCISION Right 07/09/2017   Leg    ROOT CANAL  11/13   3 on 2 teeth   ROOT CANAL  04/16/14 and 04/27/14   x2   TONSILLECTOMY     Patient Active Problem List   Diagnosis Date Noted   Osteoarthritis of right knee 11/30/2021   Tear of right hamstring 05/30/2017   Abnormal leg finding 07/07/2015   Myofascial pain dysfunction syndrome 11/21/2012   Piriformis syndrome of right side 06/20/2012   Nonallopathic lesion of lumbosacral region 06/20/2012   Plantar fasciitis 04/17/2011   Acquired hallux rigidus 04/17/2011    REFERRING DIAG: Interstitial Cystitis N30.10   THERAPY DIAG:  Cramp and spasm   Lower abdominal pain   Rationale for Evaluation and Treatment Rehabilitation   ONSET DATE: 05/2022   SUBJECTIVE:  SUBJECTIVE STATEMENT:  The TENS unit has helped and the vaginal wand. I still have pain with urination. I feel the nerve feeling. The manual work has helped.    PAIN:  Are you having pain? Yes NPRS scale: 4/10 Pain location:  outside of the perineal area  Pain type: aching and tight Pain description: discomfort  Aggravating factors: no panty liner Relieving factors: panty liner   PRECAUTIONS: None   WEIGHT BEARING RESTRICTIONS: No   FALLS:  Has patient fallen in last 6 months? No   LIVING ENVIRONMENT: Lives with: lives with their spouse   OCCUPATION: retired   PLOF: Independent   PATIENT GOALS: get rid of pain   PERTINENT HISTORY:  Interstitial Cystitis; C-section x2   BOWEL MOVEMENT No issues    URINATION: Pain with urination: Yes, less severe than yesterday Fully empty bladder: Yes:   Stream: Strong Urgency: No Frequency: every 30-40 minutes Leakage:  none Pads: No   INTERCOURSE: Pain with intercourse:  none   PREGNANCY: Vaginal deliveries 2      OBJECTIVE: (objective measures completed at initial evaluation unless otherwise dated)     DIAGNOSTIC FINDINGS:  CT scan negative of pelvis      COGNITION: Overall cognitive status: Within functional limits for tasks assessed                          SENSATION: Light touch: Appears intact Proprioception: Appears intact     PELVIC ALIGNMENT: ASIS are equal    LUMBARAROM/PROM: Lumbar ROM is full   Bilateral hip ROM is full.      PALPATION:   General  Patient has tenderness located in right suprapubic area,  tightness in the scar along the midline of the lower abdomen. Tightness in the hip adductors and tenderness located at the insertion.                  External Perineal Exam intact and good coloring                             Internal Pelvic Floor tenderness located in bilateral obturator internist, iliococcygeus, and sides of the bladder   Patient confirms identification and approves PT to assess internal pelvic floor and treatment Yes   PELVIC MMT: Not tested due to tightness in the muscles and no urinary leakage.    MMT eval  Vaginal    Internal Anal Sphincter    External Anal Sphincter    Puborectalis    Diastasis Recti    (Blank rows = not tested)         TONE: increased     TODAY'S TREATMENT:     11/26/2022 Manual: Soft tissue mobilization: Manual work along the lower abdominal area Manual work along the suprapubic bone Scar tissue mobilization: Manual work on the scar along lower abdomen with lifting it and increasing movement Myofascial release: One hand on the sacrum and other on the suprapubic area and release the fascial restrictions  11/21/2022 Manual: Myofascial release: Fascial work to the lower abdomen to release the restrictions suprapubically Internal pelvic floor techniques:No emotional/communication barriers or cognitive limitation. Patient is motivated to learn. Patient understands and agrees with treatment goals and plan. PT explains patient will be examined in standing, sitting, and lying down to see how their muscles and joints work. When they are ready, they will be asked to remove their underwear so PT can examine their  perineum. The patient is also given the option of providing their own chaperone as one is not provided in our facility. The patient also has the right and is explained the right to defer or refuse any part of the evaluation or treatment including the internal exam. With the patient's consent, PT will use one gloved finger to gently assess  the muscles of the pelvic floor, seeing how well it contracts and relaxes and if there is muscle symmetry. After, the patient will get dressed and PT and patient will discuss exam findings and plan of care. PT and patient discuss plan of care, schedule, attendance policy and HEP activities.  Going through the vaginal working around the urethra and bladder to release the restrictions and reduce her pain Patient had heat on her back while therapist was performing manual work.  Exercises: Stretches/mobility: Lay with legs up on the wall performing diaphragmatic breathing to relax the pelvic floor Educated patient to use her home TENS unit and vaginal wand to work with the pain management when she has flare ups     10/29/2022 Manual: Soft tissue mobilization: Along the sides of the scar to improve tissue mobility Scar tissue mobilization: Along the scar and educated patient on scar massage to improve the mobility Myofascial release: Lifting of the scar and going through the restrictions to improve the mobility Tissue rolling along the abdominals       PATIENT EDUCATION:  Education details: supine with legs up the wall with diaphragmatic breathing to relax the pelvic floor; going back to using her home TENS unit and vaginal wand Person educated: Patient Education method: Explanation and you tube video Education comprehension: verbalized understanding   HOME EXERCISE PROGRAM: See above.    ASSESSMENT:   CLINICAL IMPRESSION: Patient is a 67 y.o. female  who was seen today for physical therapy  treatment for interstitial cystitis. Patient has been using the TENS unit and vaginal wand at home to manage her pain. She continues to have a flare-up and the urine is negative. Patient had increased restrictions along the suprapubic bone, abdominal scar and lower abdomen. She had increased intestinal sounds with manual work. Patient will benefit from skilled therapy to improve tissue mobility and  reduce her pain.     OBJECTIVE IMPAIRMENTS: increased fascial restrictions, increased muscle spasms, and pain.    ACTIVITY LIMITATIONS: toileting   PARTICIPATION LIMITATIONS: shopping and community activity   PERSONAL FACTORS: 1-2 comorbidities: interstitial cystitis, right TKR  are also affecting patient's functional outcome.    REHAB POTENTIAL: Excellent   CLINICAL DECISION MAKING: Stable/uncomplicated   EVALUATION COMPLEXITY: Low     GOALS: Goals reviewed with patient? Yes   SHORT TERM GOALS: Target date: 10/10/2022   Patient is using her vaginal wand on the pelvic floor muscles to reduce the tightness and trigger points.  Baseline: Goal status: Met 12/4/023  2.  Patient reports her abdominal pain decreased >/= 25% due to improve tissue mobility of the lower abdomen and pelvic floor muscles.  Baseline:  Goal status: Met 10/17/2022   3.  Patient independent with diaphragmatic breathing with pelvic drop to elongate the pelvic floor.  Baseline:  Goal status: Met 10/17/2022       LONG TERM GOALS: Target date: 12/05/2022    Patient independent with advanced HEP for stretches and pelvic floor relaxation.  Baseline:  Goal status: onging 10/29/2022   2.  Patient reports her lower abdominal pain decreased >/= 80% due to reduction of pelvic floor tightness.  Baseline:  Goal status: Met 10/22/2022   3.  Patient reports she is able to urinate with discomfort due to restrictions around the bladder are resolved.  Baseline:  Goal status: met 10/29/2022     PLAN:   PT FREQUENCY: 1x/week   PT DURATION: 12 weeks   PLANNED INTERVENTIONS: Therapeutic exercises, Therapeutic activity, Neuromuscular re-education, Patient/Family education, Self Care, Dry Needling, Electrical stimulation, Moist heat, Biofeedback, and Manual therapy   PLAN FOR NEXT SESSION: manual work to scar on abdomen, sides of bladder and urethra starting on left side, diaphragmatic breathing with pelvic drop,  next visit do renewal    Earlie Counts, PT 11/26/22 10:56 AM

## 2022-11-29 DIAGNOSIS — M25569 Pain in unspecified knee: Secondary | ICD-10-CM | POA: Diagnosis not present

## 2022-11-29 DIAGNOSIS — M79669 Pain in unspecified lower leg: Secondary | ICD-10-CM | POA: Diagnosis not present

## 2022-12-03 ENCOUNTER — Encounter: Payer: Self-pay | Admitting: Physical Therapy

## 2022-12-03 ENCOUNTER — Ambulatory Visit: Payer: PPO | Admitting: Physical Therapy

## 2022-12-03 DIAGNOSIS — J301 Allergic rhinitis due to pollen: Secondary | ICD-10-CM | POA: Diagnosis not present

## 2022-12-03 DIAGNOSIS — J3081 Allergic rhinitis due to animal (cat) (dog) hair and dander: Secondary | ICD-10-CM | POA: Diagnosis not present

## 2022-12-03 DIAGNOSIS — J3089 Other allergic rhinitis: Secondary | ICD-10-CM | POA: Diagnosis not present

## 2022-12-03 DIAGNOSIS — R252 Cramp and spasm: Secondary | ICD-10-CM | POA: Diagnosis not present

## 2022-12-03 DIAGNOSIS — R1030 Lower abdominal pain, unspecified: Secondary | ICD-10-CM

## 2022-12-03 NOTE — Therapy (Signed)
OUTPATIENT PHYSICAL THERAPY TREATMENT NOTE   Patient Name: Kristin Andersen MRN: [id number] DOB:1956-08-03, 67 y.o., female Today's Date: 12/03/2022  PCP: Mayra Neer, MD  REFERRING PROVIDER: Mayra Neer, MD   END OF SESSION:   PT End of Session - 12/03/22 1018     Visit Number 8    Date for PT Re-Evaluation 03/01/23    Authorization Type [employer]    Authorization - Visit Number 8    Authorization - Number of Visits 10    PT Start Time 4098    PT Stop Time 1055    PT Time Calculation (min) 40 min    Activity Tolerance Patient tolerated treatment well;No increased pain    Behavior During Therapy WFL for tasks assessed/performed             Past Medical History:  Diagnosis Date   Anxiety    Arthritis    in knee   GERD (gastroesophageal reflux disease)    Heart palpitations    Due to anxiety    Hypothyroidism    IC (interstitial cystitis)    Myofascial pain dysfunction syndrome    Right side   Osteoporosis    Stomach ulcer    Thyroid disease    Past Surgical History:  Procedure Laterality Date   CERVICAL CONE BIOPSY     around age 68   [place] Right 11/30/2021   Procedure: COMPUTER ASSISTED TOTAL KNEE ARTHROPLASTY;  Surgeon: Rod Can, MD;  Location: [facility];  Service: Orthopedics;  Laterality: Right;   LIPOMA EXCISION Right 07/09/2017   Leg    ROOT CANAL  11/13   3 on 2 teeth   ROOT CANAL  04/16/14 and 04/27/14   x2   TONSILLECTOMY     Patient Active Problem List   Diagnosis Date Noted   Osteoarthritis of right knee 11/30/2021   Tear of right hamstring 05/30/2017   Abnormal leg finding 07/07/2015   Myofascial pain dysfunction syndrome 11/21/2012   Piriformis syndrome of right side 06/20/2012   Nonallopathic lesion of lumbosacral region 06/20/2012   Plantar fasciitis 04/17/2011   Acquired hallux rigidus 04/17/2011    REFERRING DIAG: Interstitial Cystitis N30.10   THERAPY DIAG:  Cramp and spasm   Lower abdominal pain   Rationale for Evaluation and Treatment Rehabilitation   ONSET DATE: 05/2022   SUBJECTIVE:  SUBJECTIVE STATEMENT: This week has been up and down week with pain. I did the TENS and massage. Pain mainly in the urethra. I felt better for several days after last treatment.  Drinking a lot of water so going to the bathroom every 30-40 minutes.    PAIN:  Are you having pain? Yes NPRS scale: 4/10 Pain location:  outside of the perineal area  Pain type: aching and tight Pain description: discomfort  Aggravating factors: no panty liner Relieving factors: panty liner   PRECAUTIONS: None   WEIGHT BEARING RESTRICTIONS: No   FALLS:  Has patient fallen in last 6 months? No   LIVING ENVIRONMENT: Lives with: lives with their spouse   OCCUPATION: retired   PLOF: Independent   PATIENT GOALS: get rid of pain   PERTINENT HISTORY:  Interstitial Cystitis; C-section x2   BOWEL MOVEMENT No issues    URINATION: Pain with urination: Yes, less severe than yesterday Fully empty bladder: Yes:   Stream: Strong Urgency: No Frequency: every 30-40 minutes Leakage:  none Pads: No   INTERCOURSE: Pain with intercourse:  none   PREGNANCY: Vaginal deliveries 2      OBJECTIVE: (objective measures completed at initial evaluation unless otherwise dated)     DIAGNOSTIC FINDINGS:  CT scan negative of pelvis      COGNITION: Overall cognitive status: Within functional limits for tasks assessed                          SENSATION: Light touch: Appears intact Proprioception: Appears intact     PELVIC ALIGNMENT: ASIS are equal    LUMBARAROM/PROM: Lumbar ROM is full   Bilateral hip ROM is full.       PALPATION:   General   tightness in the scar along the midline of the lower abdomen.tenderness in the umbilicus                External Perineal Exam intact and good coloring                             Internal Pelvic Floor tenderness located in bilateral obturator internist, iliococcygeus, and sides of the bladder   Patient confirms identification and approves PT to assess internal pelvic floor and treatment Yes   PELVIC MMT: Not tested due to tightness in the muscles and no urinary leakage.    MMT eval  Vaginal    Internal Anal Sphincter    External Anal Sphincter    Puborectalis    Diastasis Recti    (Blank rows = not tested)         TONE: increased     TODAY'S TREATMENT:     12/03/22 Manual: Scar tissue mobilization: Scar massage in all different directions to work on the fascial restrictions Myofascial release: Fascial release around the umbilicus and leg movement Release of the lower abdomen  Release of the rectus abdominus   11/26/2022 Manual: Soft tissue mobilization: Manual work along the lower abdominal area Manual work along the suprapubic bone Scar tissue mobilization: Manual work on the scar along lower abdomen with lifting it and increasing movement Myofascial release: One hand on the sacrum and other on the suprapubic area and release the fascial restrictions   11/21/2022 Manual: Myofascial release: Fascial work to the lower abdomen to release the restrictions suprapubically Internal pelvic floor techniques:No emotional/communication barriers or cognitive limitation. Patient is motivated to learn. Patient understands and agrees  with treatment goals and plan. PT explains patient will be examined in standing, sitting, and lying down to see how their muscles and joints work. When they are ready, they will be asked to remove their underwear so PT can examine their perineum. The patient is also given the option of providing their own chaperone as one is not  provided in our facility. The patient also has the right and is explained the right to defer or refuse any part of the evaluation or treatment including the internal exam. With the patient's consent, PT will use one gloved finger to gently assess the muscles of the pelvic floor, seeing how well it contracts and relaxes and if there is muscle symmetry. After, the patient will get dressed and PT and patient will discuss exam findings and plan of care. PT and patient discuss plan of care, schedule, attendance policy and HEP activities.  Going through the vaginal working around the urethra and bladder to release the restrictions and reduce her pain Patient had heat on her back while therapist was performing manual work.  Exercises: Stretches/mobility: Lay with legs up on the wall performing diaphragmatic breathing to relax the pelvic floor Educated patient to use her home TENS unit and vaginal wand to work with the pain management when she has flare ups     10/29/2022 Manual: Soft tissue mobilization: Along the sides of the scar to improve tissue mobility Scar tissue mobilization: Along the scar and educated patient on scar massage to improve the mobility Myofascial release: Lifting of the scar and going through the restrictions to improve the mobility Tissue rolling along the abdominals        PATIENT EDUCATION:  Education details: supine with legs up the wall with diaphragmatic breathing to relax the pelvic floor; going back to using her home TENS unit and vaginal wand Person educated: Patient Education method: Explanation and you tube video Education comprehension: verbalized understanding   HOME EXERCISE PROGRAM: See above.    ASSESSMENT:   CLINICAL IMPRESSION: Patient is a 67 y.o. female  who was seen today for physical therapy  treatment for interstitial cystitis. Patient has been using the TENS unit and vaginal wand at home to manage her pain. She continues to have a flare-up but  her pain level is lower now. Patient was able to exercise yesterday without increased pain. Her pain is isolated in the urethra and on occasion on the lower abdomen.   Patient had increased restrictions along the suprapubic bone, abdominal scar and lower abdomen. She had increased intestinal sounds with manual work. She is using her vaginal wand and home tens unit to manage her recent flare-up. Patient will benefit from skilled therapy to improve tissue mobility and reduce her pain.     OBJECTIVE IMPAIRMENTS: increased fascial restrictions, increased muscle spasms, and pain.    ACTIVITY LIMITATIONS: toileting   PARTICIPATION LIMITATIONS: shopping and community activity   PERSONAL FACTORS: 1-2 comorbidities: interstitial cystitis, right TKR  are also affecting patient's functional outcome.    REHAB POTENTIAL: Excellent   CLINICAL DECISION MAKING: Stable/uncomplicated   EVALUATION COMPLEXITY: Low     GOALS: Goals reviewed with patient? Yes   SHORT TERM GOALS: Target date: 10/10/2022   Patient is using her vaginal wand on the pelvic floor muscles to reduce the tightness and trigger points.  Baseline: Goal status: Met 12/4/023  2.  Patient reports her abdominal pain decreased >/= 25% due to improve tissue mobility of the lower abdomen and pelvic floor muscles.  Baseline:  Goal status: Met 10/17/2022   3.  Patient independent with diaphragmatic breathing with pelvic drop to elongate the pelvic floor.  Baseline:  Goal status: Met 10/17/2022       LONG TERM GOALS: Target date: 03/01/2023    Patient independent with advanced HEP for stretches and pelvic floor relaxation.  Baseline:  Goal status: onging 10/29/2022   2.  Patient reports her lower abdominal pain decreased >/= 80% due to reduction of pelvic floor tightness.  Baseline: not met since 11/19/22 when she had a recent flare-up.  Goal status:  ongoing 12/03/22  3.  Patient reports she is able to urinate with discomfort due to  restrictions around the bladder are resolved.  Baseline: not met since 11/19/22 when she had a recent flare-up Goal status: ongoing 12/03/22    PLAN:   PT FREQUENCY: 1x/week   PT DURATION: 12 weeks   PLANNED INTERVENTIONS: Therapeutic exercises, Therapeutic activity, Neuromuscular re-education, Patient/Family education, Self Care, Dry Needling, Electrical stimulation, Moist heat, Biofeedback, and Manual therapy   PLAN FOR NEXT SESSION: manual work to scar on abdomen, sides of bladder and urethra starting on left side, diaphragmatic breathing with pelvic drop   Earlie Counts, PT 12/03/22 11:01 AM

## 2022-12-04 DIAGNOSIS — Z471 Aftercare following joint replacement surgery: Secondary | ICD-10-CM | POA: Diagnosis not present

## 2022-12-04 DIAGNOSIS — Z96651 Presence of right artificial knee joint: Secondary | ICD-10-CM | POA: Diagnosis not present

## 2022-12-13 DIAGNOSIS — M25569 Pain in unspecified knee: Secondary | ICD-10-CM | POA: Diagnosis not present

## 2022-12-13 DIAGNOSIS — M79669 Pain in unspecified lower leg: Secondary | ICD-10-CM | POA: Diagnosis not present

## 2022-12-14 ENCOUNTER — Ambulatory Visit: Payer: PPO | Admitting: Physical Therapy

## 2022-12-14 ENCOUNTER — Encounter: Payer: Self-pay | Admitting: Physical Therapy

## 2022-12-14 DIAGNOSIS — J3081 Allergic rhinitis due to animal (cat) (dog) hair and dander: Secondary | ICD-10-CM | POA: Diagnosis not present

## 2022-12-14 DIAGNOSIS — J3089 Other allergic rhinitis: Secondary | ICD-10-CM | POA: Diagnosis not present

## 2022-12-14 DIAGNOSIS — J301 Allergic rhinitis due to pollen: Secondary | ICD-10-CM | POA: Diagnosis not present

## 2022-12-14 DIAGNOSIS — R252 Cramp and spasm: Secondary | ICD-10-CM

## 2022-12-14 DIAGNOSIS — R1030 Lower abdominal pain, unspecified: Secondary | ICD-10-CM

## 2022-12-14 NOTE — Therapy (Signed)
OUTPATIENT PHYSICAL THERAPY TREATMENT NOTE   Patient Name: Kristin Andersen MRN: [id number] DOB:August 07, 1956, 67 y.o., female Today's Date: 12/14/2022  PCP: Mayra Neer, MD   REFERRING PROVIDER: Mayra Neer, MD    END OF SESSION:   PT End of Session - 12/14/22 1016     Visit Number 9    Date for PT Re-Evaluation 03/01/23    Authorization Type [employer]    Authorization - Visit Number 9    Authorization - Number of Visits 10    PT Start Time 3888    PT Stop Time 1055    PT Time Calculation (min) 40 min    Activity Tolerance Patient tolerated treatment well;No increased pain    Behavior During Therapy WFL for tasks assessed/performed             Past Medical History:  Diagnosis Date   Anxiety    Arthritis    in knee   GERD (gastroesophageal reflux disease)    Heart palpitations    Due to anxiety    Hypothyroidism    IC (interstitial cystitis)    Myofascial pain dysfunction syndrome    Right side   Osteoporosis    Stomach ulcer    Thyroid disease    Past Surgical History:  Procedure Laterality Date   CERVICAL CONE BIOPSY     around age 89   [place] Right 11/30/2021   Procedure: COMPUTER ASSISTED TOTAL KNEE ARTHROPLASTY;  Surgeon: Rod Can, MD;  Location: [facility];  Service: Orthopedics;  Laterality: Right;   LIPOMA EXCISION Right 07/09/2017   Leg    ROOT CANAL  11/13   3 on 2 teeth   ROOT CANAL  04/16/14 and 04/27/14   x2   TONSILLECTOMY     Patient Active Problem List   Diagnosis Date Noted   Osteoarthritis of right knee 11/30/2021   Tear of right hamstring 05/30/2017   Abnormal leg finding 07/07/2015   Myofascial pain dysfunction syndrome 11/21/2012   Piriformis syndrome of right side 06/20/2012   Nonallopathic lesion of lumbosacral region 06/20/2012   Plantar fasciitis 04/17/2011   Acquired hallux rigidus 04/17/2011    REFERRING DIAG: Interstitial Cystitis N30.10   THERAPY DIAG:  Cramp and spasm   Lower abdominal pain   Rationale for Evaluation and Treatment Rehabilitation   ONSET DATE: 05/2022   SUBJECTIVE:  SUBJECTIVE STATEMENT: I am starting to feel better. I am not having pain but not feeling normal.    PAIN:  Are you having pain? Yes NPRS scale: 1/10 Pain location:  outside of the perineal area  Pain type: aching and tight Pain description: discomfort  Aggravating factors: no panty liner Relieving factors: panty liner   PRECAUTIONS: None   WEIGHT BEARING RESTRICTIONS: No   FALLS:  Has patient fallen in last 6 months? No   LIVING ENVIRONMENT: Lives with: lives with their spouse   OCCUPATION: retired   PLOF: Independent   PATIENT GOALS: get rid of pain   PERTINENT HISTORY:  Interstitial Cystitis; C-section x2   BOWEL MOVEMENT No issues    URINATION: Pain with urination: Yes, less severe than yesterday Fully empty bladder: Yes:   Stream: Strong Urgency: No Frequency: every 30-40 minutes Leakage:  none Pads: No   INTERCOURSE: Pain with intercourse:  none   PREGNANCY: Vaginal deliveries 2      OBJECTIVE: (objective measures completed at initial evaluation unless otherwise dated)     DIAGNOSTIC FINDINGS:  CT scan negative of pelvis      COGNITION: Overall cognitive status: Within functional limits for tasks assessed                          SENSATION: Light touch: Appears intact Proprioception: Appears intact     PELVIC ALIGNMENT: ASIS are equal    LUMBARAROM/PROM: Lumbar ROM is full   Bilateral hip ROM is full.      PALPATION:   General   tightness in the scar along the midline of the lower abdomen.tenderness in the umbilicus                External Perineal  Exam intact and good coloring                             Internal Pelvic Floor tenderness located in bilateral obturator internist, iliococcygeus, and sides of the bladder   Patient confirms identification and approves PT to assess internal pelvic floor and treatment Yes   PELVIC MMT: Not tested due to tightness in the muscles and no urinary leakage.    MMT eval  Vaginal    Internal Anal Sphincter    External Anal Sphincter    Puborectalis    Diastasis Recti    (Blank rows = not tested)         TONE: increased     TODAY'S TREATMENT:     12/14/22 Manual: Scar tissue mobilization: Scar massage in all different directions to work on the fascial restrictions Myofascial release: Release of the pubovesical ligament and urachus Release along the sides of the abdomen where the ureter and kidneys are located with pulling of the leg on the side that is worked on    12/03/22 Manual: Scar tissue mobilization: Scar massage in all different directions to work on the fascial restrictions Myofascial release: Fascial release around the umbilicus and leg movement Release of the lower abdomen  Release of the rectus abdominus     11/26/2022 Manual: Soft tissue mobilization: Manual work along the lower abdominal area Manual work along the suprapubic bone Scar tissue mobilization: Manual work on the scar along lower abdomen with lifting it and increasing movement Myofascial release: One hand on the sacrum and other on the suprapubic area and release the fascial restrictions   11/21/2022 Manual: Myofascial release: Fascial  work to the lower abdomen to release the restrictions suprapubically Internal pelvic floor techniques:No emotional/communication barriers or cognitive limitation. Patient is motivated to learn. Patient understands and agrees with treatment goals and plan. PT explains patient will be examined in standing, sitting, and lying down to see how their muscles and joints work.  When they are ready, they will be asked to remove their underwear so PT can examine their perineum. The patient is also given the option of providing their own chaperone as one is not provided in our facility. The patient also has the right and is explained the right to defer or refuse any part of the evaluation or treatment including the internal exam. With the patient's consent, PT will use one gloved finger to gently assess the muscles of the pelvic floor, seeing how well it contracts and relaxes and if there is muscle symmetry. After, the patient will get dressed and PT and patient will discuss exam findings and plan of care. PT and patient discuss plan of care, schedule, attendance policy and HEP activities.  Going through the vaginal working around the urethra and bladder to release the restrictions and reduce her pain Patient had heat on her back while therapist was performing manual work.  Exercises: Stretches/mobility: Lay with legs up on the wall performing diaphragmatic breathing to relax the pelvic floor Educated patient to use her home TENS unit and vaginal wand to work with the pain management when she has flare ups     10/29/2022 Manual: Soft tissue mobilization: Along the sides of the scar to improve tissue mobility Scar tissue mobilization: Along the scar and educated patient on scar massage to improve the mobility Myofascial release: Lifting of the scar and going through the restrictions to improve the mobility Tissue rolling along the abdominals        PATIENT EDUCATION:  Education details: supine with legs up the wall with diaphragmatic breathing to relax the pelvic floor; going back to using her home TENS unit and vaginal wand Person educated: Patient Education method: Explanation and you tube video Education comprehension: verbalized understanding   HOME EXERCISE PROGRAM: See above.    ASSESSMENT:   CLINICAL IMPRESSION: Patient is a 67 y.o. female  who was seen  today for physical therapy  treatment for interstitial cystitis. Patient was able to urinate after manual work and had no burning of the urethra for first time. Patient has fascial restrictions of the lower half of the abdomen. Patient will benefit from skilled therapy to improve tissue mobility and reduce her pain.     OBJECTIVE IMPAIRMENTS: increased fascial restrictions, increased muscle spasms, and pain.    ACTIVITY LIMITATIONS: toileting   PARTICIPATION LIMITATIONS: shopping and community activity   PERSONAL FACTORS: 1-2 comorbidities: interstitial cystitis, right TKR  are also affecting patient's functional outcome.    REHAB POTENTIAL: Excellent   CLINICAL DECISION MAKING: Stable/uncomplicated   EVALUATION COMPLEXITY: Low     GOALS: Goals reviewed with patient? Yes   SHORT TERM GOALS: Target date: 10/10/2022   Patient is using her vaginal wand on the pelvic floor muscles to reduce the tightness and trigger points.  Baseline: Goal status: Met 12/4/023  2.  Patient reports her abdominal pain decreased >/= 25% due to improve tissue mobility of the lower abdomen and pelvic floor muscles.  Baseline:  Goal status: Met 10/17/2022   3.  Patient independent with diaphragmatic breathing with pelvic drop to elongate the pelvic floor.  Baseline:  Goal status: Met 10/17/2022  LONG TERM GOALS: Target date: 03/01/2023    Patient independent with advanced HEP for stretches and pelvic floor relaxation.  Baseline:  Goal status: onging 10/29/2022   2.  Patient reports her lower abdominal pain decreased >/= 80% due to reduction of pelvic floor tightness.  Baseline: not met since 11/19/22 when she had a recent flare-up.  Goal status:  ongoing 12/03/22  3.  Patient reports she is able to urinate with discomfort due to restrictions around the bladder are resolved.  Baseline: not met since 11/19/22 when she had a recent flare-up Goal status: ongoing 12/03/22    PLAN:   PT FREQUENCY:  1x/week   PT DURATION: 12 weeks   PLANNED INTERVENTIONS: Therapeutic exercises, Therapeutic activity, Neuromuscular re-education, Patient/Family education, Self Care, Dry Needling, Electrical stimulation, Moist heat, Biofeedback, and Manual therapy   PLAN FOR NEXT SESSION: manual work to scar on abdomen, sides of bladder and urethra starting on left side, diaphragmatic breathing with pelvic drop; 10th visit note ( 10/26- 2/2)  Earlie Counts, PT 12/14/22 11:02 AM

## 2022-12-18 DIAGNOSIS — H40013 Open angle with borderline findings, low risk, bilateral: Secondary | ICD-10-CM | POA: Diagnosis not present

## 2022-12-21 ENCOUNTER — Ambulatory Visit: Payer: PPO | Attending: Family Medicine | Admitting: Physical Therapy

## 2022-12-21 ENCOUNTER — Encounter: Payer: Self-pay | Admitting: Physical Therapy

## 2022-12-21 DIAGNOSIS — J3081 Allergic rhinitis due to animal (cat) (dog) hair and dander: Secondary | ICD-10-CM | POA: Diagnosis not present

## 2022-12-21 DIAGNOSIS — R1030 Lower abdominal pain, unspecified: Secondary | ICD-10-CM | POA: Insufficient documentation

## 2022-12-21 DIAGNOSIS — J3089 Other allergic rhinitis: Secondary | ICD-10-CM | POA: Diagnosis not present

## 2022-12-21 DIAGNOSIS — J301 Allergic rhinitis due to pollen: Secondary | ICD-10-CM | POA: Diagnosis not present

## 2022-12-21 DIAGNOSIS — R252 Cramp and spasm: Secondary | ICD-10-CM | POA: Insufficient documentation

## 2022-12-21 NOTE — Therapy (Signed)
OUTPATIENT PHYSICAL THERAPY TREATMENT NOTE   Patient Name: Kristin Andersen MRN: [id number] DOB:1956-02-25, 67 y.o., female Today's Date: 12/21/2022  PCP: Mayra Neer, MD  REFERRING PROVIDER: Mayra Neer, MD  Progress Note Reporting Period 09/13/22 to 12/21/22  See note below for Objective Data and Assessment of Progress/Goals.     END OF SESSION:   PT End of Session - 12/21/22 1100     Visit Number 10    Date for PT Re-Evaluation 03/01/23    Authorization Type [employer]    Authorization - Visit Number 10    Authorization - Number of Visits 10    PT Start Time 1100    PT Stop Time 1140    PT Time Calculation (min) 40 min    Activity Tolerance Patient tolerated treatment well;No increased pain    Behavior During Therapy WFL for tasks assessed/performed             Past Medical History:  Diagnosis Date   Anxiety    Arthritis    in knee   GERD (gastroesophageal reflux disease)    Heart palpitations    Due to anxiety    Hypothyroidism    IC (interstitial cystitis)    Myofascial pain dysfunction syndrome    Right side   Osteoporosis    Stomach ulcer    Thyroid disease    Past Surgical History:  Procedure Laterality Date   CERVICAL CONE BIOPSY     around age 24   [place] Right 11/30/2021   Procedure: COMPUTER ASSISTED TOTAL KNEE ARTHROPLASTY;  Surgeon: Rod Can, MD;  Location: [facility];  Service: Orthopedics;  Laterality: Right;   LIPOMA EXCISION Right 07/09/2017   Leg    ROOT CANAL  11/13   3 on 2 teeth   ROOT CANAL  04/16/14 and 04/27/14   x2   TONSILLECTOMY     Patient Active Problem List   Diagnosis Date Noted   Osteoarthritis of right knee 11/30/2021   Tear of right hamstring 05/30/2017   Abnormal leg finding 07/07/2015   Myofascial pain dysfunction syndrome 11/21/2012   Piriformis syndrome of right side 06/20/2012    Nonallopathic lesion of lumbosacral region 06/20/2012   Plantar fasciitis 04/17/2011   Acquired hallux rigidus 04/17/2011   REFERRING DIAG: Interstitial Cystitis N30.10   THERAPY DIAG:  Cramp and spasm   Lower abdominal pain   Rationale for Evaluation and Treatment Rehabilitation   ONSET DATE: 05/2022   SUBJECTIVE:  SUBJECTIVE STATEMENT: I am doing better. I am using the TENS unit and not for the last few days. I have discomfort the last bit of urine coming out.      PAIN:  Are you having pain? Yes NPRS scale: 1/10 Pain location:  outside of the perineal area  Pain type: aching and tight Pain description: discomfort  Aggravating factors: no panty liner Relieving factors: panty liner   PRECAUTIONS: None   WEIGHT BEARING RESTRICTIONS: No   FALLS:  Has patient fallen in last 6 months? No   LIVING ENVIRONMENT: Lives with: lives with their spouse   OCCUPATION: retired   PLOF: Independent   PATIENT GOALS: get rid of pain   PERTINENT HISTORY:  Interstitial Cystitis; C-section x2   BOWEL MOVEMENT No issues    URINATION: Pain with urination: Yes, less severe than yesterday Fully empty bladder: Yes:   Stream: Strong Urgency: No Frequency: every 30-40 minutes Leakage:  none Pads: No   INTERCOURSE: Pain with intercourse:  none   PREGNANCY: Vaginal deliveries 2      OBJECTIVE: (objective measures completed at initial evaluation unless otherwise dated)     DIAGNOSTIC FINDINGS:  CT scan negative of pelvis      COGNITION: Overall cognitive status: Within functional limits for tasks assessed                          SENSATION: Light touch: Appears intact Proprioception: Appears intact     PELVIC ALIGNMENT: ASIS are equal    LUMBARAROM/PROM: Lumbar ROM is full    Bilateral hip ROM is full.      PALPATION:   General   tightness in the scar along the midline of the lower abdomen.tenderness in the umbilicus                External Perineal Exam intact and good coloring                             Internal Pelvic Floor tenderness located in bilateral obturator internist, iliococcygeus, and sides of the bladder   Patient confirms identification and approves PT to assess internal pelvic floor and treatment Yes   PELVIC MMT: Not tested due to tightness in the muscles and no urinary leakage.    MMT eval  Vaginal    Internal Anal Sphincter    External Anal Sphincter    Puborectalis    Diastasis Recti    (Blank rows = not tested)         TONE: increased     TODAY'S TREATMENT:     12/21/22 Manual: Soft tissue mobilization: Scar tissue mobilization: Scar massage in all different directions to work on the fascial restrictions Internal pelvic floor techniques: No emotional/communication barriers or cognitive limitation. Patient is motivated to learn. Patient understands and agrees with treatment goals and plan. PT explains patient will be examined in standing, sitting, and lying down to see how their muscles and joints work. When they are ready, they will be asked to remove their underwear so PT can examine their perineum. The patient is also given the option of providing their own chaperone as one is not provided in our facility. The patient also has the right and is explained the right to defer or refuse any part of the evaluation or treatment including the internal exam. With the patient's consent, PT will use one gloved finger to gently assess the muscles  of the pelvic floor, seeing how well it contracts and relaxes and if there is muscle symmetry. After, the patient will get dressed and PT and patient will discuss exam findings and plan of care. PT and patient discuss plan of care, schedule, attendance policy and HEP activities.  Going through the  vaginal canal working on the levator ani and releasing along the ureter internally and externally    12/14/22 Manual: Scar tissue mobilization: Scar massage in all different directions to work on the fascial restrictions Myofascial release: Release of the pubovesical ligament and urachus Release along the sides of the abdomen where the ureter and kidneys are located with pulling of the leg on the side that is worked on     12/03/22 Manual: Scar tissue mobilization: Scar massage in all different directions to work on the fascial restrictions Myofascial release: Fascial release around the umbilicus and leg movement Release of the lower abdomen  Release of the rectus abdominus       PATIENT EDUCATION:  Education details: supine with legs up the wall with diaphragmatic breathing to relax the pelvic floor; going back to using her home TENS unit and vaginal wand Person educated: Patient Education method: Explanation and you tube video Education comprehension: verbalized understanding   HOME EXERCISE PROGRAM: See above.    ASSESSMENT:   CLINICAL IMPRESSION: Patient is a 67 y.o. female  who was seen today for physical therapy  treatment for interstitial cystitis. Patient was able to urinate after manual work and had no burning of the urethra for first time except for the distal end of the urethra. She is using pain management techniques to reduce her pain with flare-up including TENS unit, working with the vaginal wand and stretches. Her lower abdominal pain is 80% better. She does her own scar massage at home. Patient has fascial restrictions of the lower half of the abdomen. Patient will benefit from skilled therapy to improve tissue mobility and reduce her pain.     OBJECTIVE IMPAIRMENTS: increased fascial restrictions, increased muscle spasms, and pain.    ACTIVITY LIMITATIONS: toileting   PARTICIPATION LIMITATIONS: shopping and community activity   PERSONAL FACTORS: 1-2  comorbidities: interstitial cystitis, right TKR  are also affecting patient's functional outcome.    REHAB POTENTIAL: Excellent   CLINICAL DECISION MAKING: Stable/uncomplicated   EVALUATION COMPLEXITY: Low     GOALS: Goals reviewed with patient? Yes   SHORT TERM GOALS: Target date: 10/10/2022   Patient is using her vaginal wand on the pelvic floor muscles to reduce the tightness and trigger points.  Baseline: Goal status: Met 12/4/023  2.  Patient reports her abdominal pain decreased >/= 25% due to improve tissue mobility of the lower abdomen and pelvic floor muscles.  Baseline:  Goal status: Met 10/17/2022   3.  Patient independent with diaphragmatic breathing with pelvic drop to elongate the pelvic floor.  Baseline:  Goal status: Met 10/17/2022       LONG TERM GOALS: Target date: 03/01/2023    Patient independent with advanced HEP for stretches and pelvic floor relaxation.  Baseline:  Goal status: onging 12/21/2022   2.  Patient reports her lower abdominal pain decreased >/= 80% due to reduction of pelvic floor tightness.  Baseline: not met since 11/19/22 when she had a recent flare-up.  Goal status:  Met 12/21/22   3.  Patient reports she is able to urinate with discomfort due to restrictions around the bladder are resolved.  Baseline: not met since 11/19/22 when she had  a recent flare-up Goal status: ongoing 12/21/22    PLAN:   PT FREQUENCY: 1x/week   PT DURATION: 12 weeks   PLANNED INTERVENTIONS: Therapeutic exercises, Therapeutic activity, Neuromuscular re-education, Patient/Family education, Self Care, Dry Needling, Electrical stimulation, Moist heat, Biofeedback, and Manual therapy   PLAN FOR NEXT SESSION: manual work to scar on abdomen, sides of bladder and urethra starting on left side, diaphragmatic breathing with pelvic drop   Earlie Counts, PT 12/21/22 11:51 AM

## 2022-12-24 ENCOUNTER — Ambulatory Visit: Payer: PPO | Admitting: Physical Therapy

## 2022-12-27 DIAGNOSIS — M25569 Pain in unspecified knee: Secondary | ICD-10-CM | POA: Diagnosis not present

## 2022-12-27 DIAGNOSIS — M79669 Pain in unspecified lower leg: Secondary | ICD-10-CM | POA: Diagnosis not present

## 2022-12-28 DIAGNOSIS — J301 Allergic rhinitis due to pollen: Secondary | ICD-10-CM | POA: Diagnosis not present

## 2022-12-28 DIAGNOSIS — J3089 Other allergic rhinitis: Secondary | ICD-10-CM | POA: Diagnosis not present

## 2022-12-28 DIAGNOSIS — J3081 Allergic rhinitis due to animal (cat) (dog) hair and dander: Secondary | ICD-10-CM | POA: Diagnosis not present

## 2023-01-01 DIAGNOSIS — H11442 Conjunctival cysts, left eye: Secondary | ICD-10-CM | POA: Diagnosis not present

## 2023-01-03 DIAGNOSIS — J3089 Other allergic rhinitis: Secondary | ICD-10-CM | POA: Diagnosis not present

## 2023-01-03 DIAGNOSIS — J301 Allergic rhinitis due to pollen: Secondary | ICD-10-CM | POA: Diagnosis not present

## 2023-01-03 DIAGNOSIS — J3081 Allergic rhinitis due to animal (cat) (dog) hair and dander: Secondary | ICD-10-CM | POA: Diagnosis not present

## 2023-01-09 ENCOUNTER — Encounter: Payer: Self-pay | Admitting: Physical Therapy

## 2023-01-09 ENCOUNTER — Ambulatory Visit: Payer: PPO | Admitting: Physical Therapy

## 2023-01-09 DIAGNOSIS — J3081 Allergic rhinitis due to animal (cat) (dog) hair and dander: Secondary | ICD-10-CM | POA: Diagnosis not present

## 2023-01-09 DIAGNOSIS — J301 Allergic rhinitis due to pollen: Secondary | ICD-10-CM | POA: Diagnosis not present

## 2023-01-09 DIAGNOSIS — R252 Cramp and spasm: Secondary | ICD-10-CM

## 2023-01-09 DIAGNOSIS — J3089 Other allergic rhinitis: Secondary | ICD-10-CM | POA: Diagnosis not present

## 2023-01-09 DIAGNOSIS — R1030 Lower abdominal pain, unspecified: Secondary | ICD-10-CM

## 2023-01-09 NOTE — Therapy (Addendum)
OUTPATIENT PHYSICAL THERAPY TREATMENT NOTE   Patient Name: Kristin Andersen MRN: [id number] DOB:05-13-1956, 67 y.o., female Today's Date: 01/09/2023  PCP: Mayra Neer, MD   REFERRING PROVIDER: Mayra Neer, MD    END OF SESSION:   PT End of Session - 01/09/23 1106     Visit Number 11    Date for PT Re-Evaluation 03/01/23    Authorization Type [employer]    Authorization - Visit Number 11    Authorization - Number of Visits 20    PT Start Time 1100    PT Stop Time 1140    PT Time Calculation (min) 40 min    Activity Tolerance Patient tolerated treatment well;No increased pain    Behavior During Therapy WFL for tasks assessed/performed             Past Medical History:  Diagnosis Date   Anxiety    Arthritis    in knee   GERD (gastroesophageal reflux disease)    Heart palpitations    Due to anxiety    Hypothyroidism    IC (interstitial cystitis)    Myofascial pain dysfunction syndrome    Right side   Osteoporosis    Stomach ulcer    Thyroid disease    Past Surgical History:  Procedure Laterality Date   CERVICAL CONE BIOPSY     around age 77   [place] Right 11/30/2021   Procedure: COMPUTER ASSISTED TOTAL KNEE ARTHROPLASTY;  Surgeon: Rod Can, MD;  Location: [facility];  Service: Orthopedics;  Laterality: Right;   LIPOMA EXCISION Right 07/09/2017   Leg    ROOT CANAL  11/13   3 on 2 teeth   ROOT CANAL  04/16/14 and 04/27/14   x2   TONSILLECTOMY     Patient Active Problem List   Diagnosis Date Noted   Osteoarthritis of right knee 11/30/2021   Tear of right hamstring 05/30/2017   Abnormal leg finding 07/07/2015   Myofascial pain dysfunction syndrome 11/21/2012   Piriformis syndrome of right side 06/20/2012   Nonallopathic lesion of lumbosacral region 06/20/2012   Plantar fasciitis 04/17/2011   Acquired hallux rigidus 04/17/2011    REFERRING DIAG: Interstitial Cystitis N30.10   THERAPY DIAG:  Cramp and spasm   Lower abdominal pain   Rationale for Evaluation and Treatment Rehabilitation   ONSET DATE: 05/2022   SUBJECTIVE:  SUBJECTIVE STATEMENT: I have been doing really well. I will have to wait a minute to urinate.     PAIN:  Are you having pain? Yes NPRS scale: 0/10 Pain location:  outside of the perineal area  Pain type: aching and tight Pain description: discomfort  Aggravating factors: no panty liner Relieving factors: panty liner   PRECAUTIONS: None   WEIGHT BEARING RESTRICTIONS: No   FALLS:  Has patient fallen in last 6 months? No   LIVING ENVIRONMENT: Lives with: lives with their spouse   OCCUPATION: retired   PLOF: Independent   PATIENT GOALS: get rid of pain   PERTINENT HISTORY:  Interstitial Cystitis; C-section x2   BOWEL MOVEMENT No issues    URINATION: Pain with urination: Yes, less severe than yesterday Fully empty bladder: Yes, hesitation in the beginning of the urination Stream: Strong Urgency: No Frequency: every 30-40 minutes Leakage:  none Pads: No   INTERCOURSE: Pain with intercourse:  none   PREGNANCY: Vaginal deliveries 2      OBJECTIVE: (objective measures completed at initial evaluation unless otherwise dated)     DIAGNOSTIC FINDINGS:  CT scan negative of pelvis      COGNITION: Overall cognitive status: Within functional limits for tasks assessed                          SENSATION: Light touch: Appears intact Proprioception: Appears intact     PELVIC ALIGNMENT: ASIS are equal    LUMBARAROM/PROM: Lumbar ROM is full   Bilateral hip ROM is full.      PALPATION:   General   tightness in the scar along the midline of the lower abdomen.tenderness in the  umbilicus                External Perineal Exam intact and good coloring                             Internal Pelvic Floor tenderness located in bilateral obturator internist, iliococcygeus, and sides of the bladder   Patient confirms identification and approves PT to assess internal pelvic floor and treatment Yes   PELVIC MMT: Not tested due to tightness in the muscles and no urinary leakage.    MMT eval  Vaginal    Internal Anal Sphincter    External Anal Sphincter    Puborectalis    Diastasis Recti    (Blank rows = not tested)         TONE: increased     TODAY'S TREATMENT:     01/09/23 Manual: Internal pelvic floor techniques: No emotional/communication barriers or cognitive limitation. Patient is motivated to learn. Patient understands and agrees with treatment goals and plan. PT explains patient will be examined in standing, sitting, and lying down to see how their muscles and joints work. When they are ready, they will be asked to remove their underwear so PT can examine their perineum. The patient is also given the option of providing their own chaperone as one is not provided in our facility. The patient also has the right and is explained the right to defer or refuse any part of the evaluation or treatment including the internal exam. With the patient's consent, PT will use one gloved finger to gently assess the muscles of the pelvic floor, seeing how well it contracts and relaxes and if there is muscle symmetry. After, the patient will get dressed and PT and  patient will discuss exam findings and plan of care. PT and patient discuss plan of care, schedule, attendance policy and HEP activities.  Going through the vagina working on the levator ani, along the pubovaginalis, posterior fourchette, and long the introintus Neuromuscular re-education: Down training: Sitting on commode with diaphragmatic breathing to relax the pelvic floor and let the urine comes  out  12/21/22 Manual: Soft tissue mobilization: Scar tissue mobilization: Scar massage in all different directions to work on the fascial restrictions Internal pelvic floor techniques: No emotional/communication barriers or cognitive limitation. Patient is motivated to learn. Patient understands and agrees with treatment goals and plan. PT explains patient will be examined in standing, sitting, and lying down to see how their muscles and joints work. When they are ready, they will be asked to remove their underwear so PT can examine their perineum. The patient is also given the option of providing their own chaperone as one is not provided in our facility. The patient also has the right and is explained the right to defer or refuse any part of the evaluation or treatment including the internal exam. With the patient's consent, PT will use one gloved finger to gently assess the muscles of the pelvic floor, seeing how well it contracts and relaxes and if there is muscle symmetry. After, the patient will get dressed and PT and patient will discuss exam findings and plan of care. PT and patient discuss plan of care, schedule, attendance policy and HEP activities.  Going through the vaginal canal working on the levator ani and releasing along the ureter internally and externally    12/14/22 Manual: Scar tissue mobilization: Scar massage in all different directions to work on the fascial restrictions Myofascial release: Release of the pubovesical ligament and urachus Release along the sides of the abdomen where the ureter and kidneys are located with pulling of the leg on the side that is worked on       PATIENT EDUCATION:  Education details: supine with legs up the wall with diaphragmatic breathing to relax the pelvic floor; going back to using her home TENS unit and vaginal wand Person educated: Patient Education method: Explanation and you tube video Education comprehension: verbalized  understanding   HOME EXERCISE PROGRAM: See above.    ASSESSMENT:   CLINICAL IMPRESSION: Patient is a 67 y.o. female  who was seen today for physical therapy  treatment for interstitial cystitis. Patient is not having the bladder pain or the urethra pain. She is having hesitancy with urination and has to think about urination. She has tightness in the pelvic floor muscles with right worse than the left. Patient is urinating every 40 minutes due to the increase in water intake.  Patient will benefit from skilled therapy to improve tissue mobility and reduce her pain.     OBJECTIVE IMPAIRMENTS: increased fascial restrictions, increased muscle spasms, and pain.    ACTIVITY LIMITATIONS: toileting   PARTICIPATION LIMITATIONS: shopping and community activity   PERSONAL FACTORS: 1-2 comorbidities: interstitial cystitis, right TKR  are also affecting patient's functional outcome.    REHAB POTENTIAL: Excellent   CLINICAL DECISION MAKING: Stable/uncomplicated   EVALUATION COMPLEXITY: Low     GOALS: Goals reviewed with patient? Yes   SHORT TERM GOALS: Target date: 10/10/2022   Patient is using her vaginal wand on the pelvic floor muscles to reduce the tightness and trigger points.  Baseline: Goal status: Met 12/4/023  2.  Patient reports her abdominal pain decreased >/= 25% due to improve tissue  mobility of the lower abdomen and pelvic floor muscles.  Baseline:  Goal status: Met 10/17/2022   3.  Patient independent with diaphragmatic breathing with pelvic drop to elongate the pelvic floor.  Baseline:  Goal status: Met 10/17/2022       LONG TERM GOALS: Target date: 03/01/2023    Patient independent with advanced HEP for stretches and pelvic floor relaxation.  Baseline:  Goal status: onging 12/21/2022   2.  Patient reports her lower abdominal pain decreased >/= 80% due to reduction of pelvic floor tightness.  Baseline: not met since 11/19/22 when she had a recent flare-up.  Goal  status:  Met 12/21/22    3.  Patient reports she is able to urinate with discomfort due to restrictions around the bladder are resolved.  Baseline: not met since 11/19/22 when she had a recent flare-up Goal status: ongoing 12/21/22    PLAN:   PT FREQUENCY: 1x/week   PT DURATION: 12 weeks   PLANNED INTERVENTIONS: Therapeutic exercises, Therapeutic activity, Neuromuscular re-education, Patient/Family education, Self Care, Dry Needling, Electrical stimulation, Moist heat, Biofeedback, and Manual therapy   PLAN FOR NEXT SESSION: manual work to scar on abdomen, sides of bladder and urethra starting on left side, diaphragmatic breathing with pelvic drop   Earlie Counts, PT 01/09/23 11:45 AM   PHYSICAL THERAPY DISCHARGE SUMMARY  Visits from Start of Care: 11  Current functional level related to goals / functional outcomes: See above. Patient called on 01/22/23 and reported she was doing well and wanted to be discharged.    Remaining deficits: See above.    Education / Equipment: HEP   Patient agrees to discharge. Patient goals were partially met. Patient is being discharged due to being pleased with the current functional level. Thank you for the referral. Earlie Counts, PT 01/22/23 12:40 PM

## 2023-01-10 DIAGNOSIS — Z79899 Other long term (current) drug therapy: Secondary | ICD-10-CM | POA: Diagnosis not present

## 2023-01-10 DIAGNOSIS — G4700 Insomnia, unspecified: Secondary | ICD-10-CM | POA: Diagnosis not present

## 2023-01-10 DIAGNOSIS — K219 Gastro-esophageal reflux disease without esophagitis: Secondary | ICD-10-CM | POA: Diagnosis not present

## 2023-01-10 DIAGNOSIS — M810 Age-related osteoporosis without current pathological fracture: Secondary | ICD-10-CM | POA: Diagnosis not present

## 2023-01-10 DIAGNOSIS — M25569 Pain in unspecified knee: Secondary | ICD-10-CM | POA: Diagnosis not present

## 2023-01-10 DIAGNOSIS — E039 Hypothyroidism, unspecified: Secondary | ICD-10-CM | POA: Diagnosis not present

## 2023-01-10 DIAGNOSIS — N952 Postmenopausal atrophic vaginitis: Secondary | ICD-10-CM | POA: Diagnosis not present

## 2023-01-10 DIAGNOSIS — Z0000 Encounter for general adult medical examination without abnormal findings: Secondary | ICD-10-CM | POA: Diagnosis not present

## 2023-01-10 DIAGNOSIS — M79669 Pain in unspecified lower leg: Secondary | ICD-10-CM | POA: Diagnosis not present

## 2023-01-10 DIAGNOSIS — F411 Generalized anxiety disorder: Secondary | ICD-10-CM | POA: Diagnosis not present

## 2023-01-10 DIAGNOSIS — J309 Allergic rhinitis, unspecified: Secondary | ICD-10-CM | POA: Diagnosis not present

## 2023-01-10 DIAGNOSIS — N3010 Interstitial cystitis (chronic) without hematuria: Secondary | ICD-10-CM | POA: Diagnosis not present

## 2023-01-16 ENCOUNTER — Encounter: Payer: PPO | Admitting: Physical Therapy

## 2023-01-21 DIAGNOSIS — M25569 Pain in unspecified knee: Secondary | ICD-10-CM | POA: Diagnosis not present

## 2023-01-21 DIAGNOSIS — M79669 Pain in unspecified lower leg: Secondary | ICD-10-CM | POA: Diagnosis not present

## 2023-01-22 DIAGNOSIS — J301 Allergic rhinitis due to pollen: Secondary | ICD-10-CM | POA: Diagnosis not present

## 2023-01-22 DIAGNOSIS — J3081 Allergic rhinitis due to animal (cat) (dog) hair and dander: Secondary | ICD-10-CM | POA: Diagnosis not present

## 2023-01-22 DIAGNOSIS — J3089 Other allergic rhinitis: Secondary | ICD-10-CM | POA: Diagnosis not present

## 2023-01-23 ENCOUNTER — Ambulatory Visit: Payer: PPO | Admitting: Physical Therapy

## 2023-01-29 DIAGNOSIS — J3081 Allergic rhinitis due to animal (cat) (dog) hair and dander: Secondary | ICD-10-CM | POA: Diagnosis not present

## 2023-01-29 DIAGNOSIS — J3089 Other allergic rhinitis: Secondary | ICD-10-CM | POA: Diagnosis not present

## 2023-01-29 DIAGNOSIS — J301 Allergic rhinitis due to pollen: Secondary | ICD-10-CM | POA: Diagnosis not present

## 2023-02-05 DIAGNOSIS — J3081 Allergic rhinitis due to animal (cat) (dog) hair and dander: Secondary | ICD-10-CM | POA: Diagnosis not present

## 2023-02-05 DIAGNOSIS — J3089 Other allergic rhinitis: Secondary | ICD-10-CM | POA: Diagnosis not present

## 2023-02-05 DIAGNOSIS — J301 Allergic rhinitis due to pollen: Secondary | ICD-10-CM | POA: Diagnosis not present

## 2023-02-07 DIAGNOSIS — M79669 Pain in unspecified lower leg: Secondary | ICD-10-CM | POA: Diagnosis not present

## 2023-02-07 DIAGNOSIS — M25569 Pain in unspecified knee: Secondary | ICD-10-CM | POA: Diagnosis not present

## 2023-02-14 DIAGNOSIS — J301 Allergic rhinitis due to pollen: Secondary | ICD-10-CM | POA: Diagnosis not present

## 2023-02-14 DIAGNOSIS — J3081 Allergic rhinitis due to animal (cat) (dog) hair and dander: Secondary | ICD-10-CM | POA: Diagnosis not present

## 2023-02-14 DIAGNOSIS — J3089 Other allergic rhinitis: Secondary | ICD-10-CM | POA: Diagnosis not present

## 2023-02-19 DIAGNOSIS — M79669 Pain in unspecified lower leg: Secondary | ICD-10-CM | POA: Diagnosis not present

## 2023-02-19 DIAGNOSIS — M25569 Pain in unspecified knee: Secondary | ICD-10-CM | POA: Diagnosis not present

## 2023-02-21 DIAGNOSIS — J3081 Allergic rhinitis due to animal (cat) (dog) hair and dander: Secondary | ICD-10-CM | POA: Diagnosis not present

## 2023-02-21 DIAGNOSIS — J301 Allergic rhinitis due to pollen: Secondary | ICD-10-CM | POA: Diagnosis not present

## 2023-02-21 DIAGNOSIS — J3089 Other allergic rhinitis: Secondary | ICD-10-CM | POA: Diagnosis not present

## 2023-02-28 DIAGNOSIS — J3081 Allergic rhinitis due to animal (cat) (dog) hair and dander: Secondary | ICD-10-CM | POA: Diagnosis not present

## 2023-02-28 DIAGNOSIS — J3089 Other allergic rhinitis: Secondary | ICD-10-CM | POA: Diagnosis not present

## 2023-02-28 DIAGNOSIS — J301 Allergic rhinitis due to pollen: Secondary | ICD-10-CM | POA: Diagnosis not present

## 2023-03-05 DIAGNOSIS — M79669 Pain in unspecified lower leg: Secondary | ICD-10-CM | POA: Diagnosis not present

## 2023-03-05 DIAGNOSIS — M25569 Pain in unspecified knee: Secondary | ICD-10-CM | POA: Diagnosis not present

## 2023-03-08 DIAGNOSIS — J3081 Allergic rhinitis due to animal (cat) (dog) hair and dander: Secondary | ICD-10-CM | POA: Diagnosis not present

## 2023-03-08 DIAGNOSIS — J301 Allergic rhinitis due to pollen: Secondary | ICD-10-CM | POA: Diagnosis not present

## 2023-03-08 DIAGNOSIS — J3089 Other allergic rhinitis: Secondary | ICD-10-CM | POA: Diagnosis not present

## 2023-03-14 DIAGNOSIS — J3081 Allergic rhinitis due to animal (cat) (dog) hair and dander: Secondary | ICD-10-CM | POA: Diagnosis not present

## 2023-03-14 DIAGNOSIS — J301 Allergic rhinitis due to pollen: Secondary | ICD-10-CM | POA: Diagnosis not present

## 2023-03-14 DIAGNOSIS — J3089 Other allergic rhinitis: Secondary | ICD-10-CM | POA: Diagnosis not present

## 2023-03-19 DIAGNOSIS — M79669 Pain in unspecified lower leg: Secondary | ICD-10-CM | POA: Diagnosis not present

## 2023-03-19 DIAGNOSIS — M25569 Pain in unspecified knee: Secondary | ICD-10-CM | POA: Diagnosis not present

## 2023-03-25 DIAGNOSIS — J3089 Other allergic rhinitis: Secondary | ICD-10-CM | POA: Diagnosis not present

## 2023-03-25 DIAGNOSIS — J3081 Allergic rhinitis due to animal (cat) (dog) hair and dander: Secondary | ICD-10-CM | POA: Diagnosis not present

## 2023-03-25 DIAGNOSIS — J301 Allergic rhinitis due to pollen: Secondary | ICD-10-CM | POA: Diagnosis not present

## 2023-04-03 DIAGNOSIS — M25569 Pain in unspecified knee: Secondary | ICD-10-CM | POA: Diagnosis not present

## 2023-04-03 DIAGNOSIS — M79669 Pain in unspecified lower leg: Secondary | ICD-10-CM | POA: Diagnosis not present

## 2023-04-05 DIAGNOSIS — J301 Allergic rhinitis due to pollen: Secondary | ICD-10-CM | POA: Diagnosis not present

## 2023-04-05 DIAGNOSIS — J3081 Allergic rhinitis due to animal (cat) (dog) hair and dander: Secondary | ICD-10-CM | POA: Diagnosis not present

## 2023-04-05 DIAGNOSIS — J3089 Other allergic rhinitis: Secondary | ICD-10-CM | POA: Diagnosis not present

## 2023-04-12 DIAGNOSIS — J3089 Other allergic rhinitis: Secondary | ICD-10-CM | POA: Diagnosis not present

## 2023-04-12 DIAGNOSIS — J301 Allergic rhinitis due to pollen: Secondary | ICD-10-CM | POA: Diagnosis not present

## 2023-04-12 DIAGNOSIS — J3081 Allergic rhinitis due to animal (cat) (dog) hair and dander: Secondary | ICD-10-CM | POA: Diagnosis not present

## 2023-04-19 DIAGNOSIS — M25569 Pain in unspecified knee: Secondary | ICD-10-CM | POA: Diagnosis not present

## 2023-04-19 DIAGNOSIS — M79669 Pain in unspecified lower leg: Secondary | ICD-10-CM | POA: Diagnosis not present

## 2023-04-23 DIAGNOSIS — J3081 Allergic rhinitis due to animal (cat) (dog) hair and dander: Secondary | ICD-10-CM | POA: Diagnosis not present

## 2023-04-23 DIAGNOSIS — J3089 Other allergic rhinitis: Secondary | ICD-10-CM | POA: Diagnosis not present

## 2023-04-23 DIAGNOSIS — J301 Allergic rhinitis due to pollen: Secondary | ICD-10-CM | POA: Diagnosis not present

## 2023-04-30 DIAGNOSIS — M25569 Pain in unspecified knee: Secondary | ICD-10-CM | POA: Diagnosis not present

## 2023-04-30 DIAGNOSIS — M79669 Pain in unspecified lower leg: Secondary | ICD-10-CM | POA: Diagnosis not present

## 2023-05-07 DIAGNOSIS — J3081 Allergic rhinitis due to animal (cat) (dog) hair and dander: Secondary | ICD-10-CM | POA: Diagnosis not present

## 2023-05-07 DIAGNOSIS — J3089 Other allergic rhinitis: Secondary | ICD-10-CM | POA: Diagnosis not present

## 2023-05-07 DIAGNOSIS — J301 Allergic rhinitis due to pollen: Secondary | ICD-10-CM | POA: Diagnosis not present

## 2023-05-14 DIAGNOSIS — J3089 Other allergic rhinitis: Secondary | ICD-10-CM | POA: Diagnosis not present

## 2023-05-14 DIAGNOSIS — J301 Allergic rhinitis due to pollen: Secondary | ICD-10-CM | POA: Diagnosis not present

## 2023-05-14 DIAGNOSIS — J3081 Allergic rhinitis due to animal (cat) (dog) hair and dander: Secondary | ICD-10-CM | POA: Diagnosis not present

## 2023-05-15 DIAGNOSIS — M25569 Pain in unspecified knee: Secondary | ICD-10-CM | POA: Diagnosis not present

## 2023-05-15 DIAGNOSIS — M79669 Pain in unspecified lower leg: Secondary | ICD-10-CM | POA: Diagnosis not present

## 2023-06-10 DIAGNOSIS — M79669 Pain in unspecified lower leg: Secondary | ICD-10-CM | POA: Diagnosis not present

## 2023-06-10 DIAGNOSIS — M25569 Pain in unspecified knee: Secondary | ICD-10-CM | POA: Diagnosis not present

## 2023-06-11 DIAGNOSIS — J3081 Allergic rhinitis due to animal (cat) (dog) hair and dander: Secondary | ICD-10-CM | POA: Diagnosis not present

## 2023-06-11 DIAGNOSIS — J3089 Other allergic rhinitis: Secondary | ICD-10-CM | POA: Diagnosis not present

## 2023-06-11 DIAGNOSIS — J301 Allergic rhinitis due to pollen: Secondary | ICD-10-CM | POA: Diagnosis not present

## 2023-06-18 DIAGNOSIS — J301 Allergic rhinitis due to pollen: Secondary | ICD-10-CM | POA: Diagnosis not present

## 2023-06-18 DIAGNOSIS — J3081 Allergic rhinitis due to animal (cat) (dog) hair and dander: Secondary | ICD-10-CM | POA: Diagnosis not present

## 2023-06-18 DIAGNOSIS — J3089 Other allergic rhinitis: Secondary | ICD-10-CM | POA: Diagnosis not present

## 2023-06-26 DIAGNOSIS — M25569 Pain in unspecified knee: Secondary | ICD-10-CM | POA: Diagnosis not present

## 2023-06-26 DIAGNOSIS — J3081 Allergic rhinitis due to animal (cat) (dog) hair and dander: Secondary | ICD-10-CM | POA: Diagnosis not present

## 2023-06-26 DIAGNOSIS — J301 Allergic rhinitis due to pollen: Secondary | ICD-10-CM | POA: Diagnosis not present

## 2023-06-26 DIAGNOSIS — M79669 Pain in unspecified lower leg: Secondary | ICD-10-CM | POA: Diagnosis not present

## 2023-06-26 DIAGNOSIS — J3089 Other allergic rhinitis: Secondary | ICD-10-CM | POA: Diagnosis not present

## 2023-07-03 DIAGNOSIS — J3081 Allergic rhinitis due to animal (cat) (dog) hair and dander: Secondary | ICD-10-CM | POA: Diagnosis not present

## 2023-07-03 DIAGNOSIS — J3089 Other allergic rhinitis: Secondary | ICD-10-CM | POA: Diagnosis not present

## 2023-07-03 DIAGNOSIS — J301 Allergic rhinitis due to pollen: Secondary | ICD-10-CM | POA: Diagnosis not present

## 2023-07-09 DIAGNOSIS — J3081 Allergic rhinitis due to animal (cat) (dog) hair and dander: Secondary | ICD-10-CM | POA: Diagnosis not present

## 2023-07-09 DIAGNOSIS — J301 Allergic rhinitis due to pollen: Secondary | ICD-10-CM | POA: Diagnosis not present

## 2023-07-09 DIAGNOSIS — J3089 Other allergic rhinitis: Secondary | ICD-10-CM | POA: Diagnosis not present

## 2023-07-10 DIAGNOSIS — J301 Allergic rhinitis due to pollen: Secondary | ICD-10-CM | POA: Diagnosis not present

## 2023-07-10 DIAGNOSIS — J3081 Allergic rhinitis due to animal (cat) (dog) hair and dander: Secondary | ICD-10-CM | POA: Diagnosis not present

## 2023-07-15 DIAGNOSIS — Z1231 Encounter for screening mammogram for malignant neoplasm of breast: Secondary | ICD-10-CM | POA: Diagnosis not present

## 2023-07-18 DIAGNOSIS — J3089 Other allergic rhinitis: Secondary | ICD-10-CM | POA: Diagnosis not present

## 2023-07-18 DIAGNOSIS — J301 Allergic rhinitis due to pollen: Secondary | ICD-10-CM | POA: Diagnosis not present

## 2023-07-18 DIAGNOSIS — J3081 Allergic rhinitis due to animal (cat) (dog) hair and dander: Secondary | ICD-10-CM | POA: Diagnosis not present

## 2023-07-23 DIAGNOSIS — M79669 Pain in unspecified lower leg: Secondary | ICD-10-CM | POA: Diagnosis not present

## 2023-07-23 DIAGNOSIS — M25569 Pain in unspecified knee: Secondary | ICD-10-CM | POA: Diagnosis not present

## 2023-07-24 DIAGNOSIS — J3089 Other allergic rhinitis: Secondary | ICD-10-CM | POA: Diagnosis not present

## 2023-07-24 DIAGNOSIS — J3081 Allergic rhinitis due to animal (cat) (dog) hair and dander: Secondary | ICD-10-CM | POA: Diagnosis not present

## 2023-07-24 DIAGNOSIS — J301 Allergic rhinitis due to pollen: Secondary | ICD-10-CM | POA: Diagnosis not present

## 2023-08-01 DIAGNOSIS — J301 Allergic rhinitis due to pollen: Secondary | ICD-10-CM | POA: Diagnosis not present

## 2023-08-01 DIAGNOSIS — J3081 Allergic rhinitis due to animal (cat) (dog) hair and dander: Secondary | ICD-10-CM | POA: Diagnosis not present

## 2023-08-01 DIAGNOSIS — J3089 Other allergic rhinitis: Secondary | ICD-10-CM | POA: Diagnosis not present

## 2023-08-08 DIAGNOSIS — J301 Allergic rhinitis due to pollen: Secondary | ICD-10-CM | POA: Diagnosis not present

## 2023-08-08 DIAGNOSIS — J3081 Allergic rhinitis due to animal (cat) (dog) hair and dander: Secondary | ICD-10-CM | POA: Diagnosis not present

## 2023-08-08 DIAGNOSIS — J3089 Other allergic rhinitis: Secondary | ICD-10-CM | POA: Diagnosis not present

## 2023-08-15 DIAGNOSIS — J301 Allergic rhinitis due to pollen: Secondary | ICD-10-CM | POA: Diagnosis not present

## 2023-08-15 DIAGNOSIS — J3089 Other allergic rhinitis: Secondary | ICD-10-CM | POA: Diagnosis not present

## 2023-08-15 DIAGNOSIS — J3081 Allergic rhinitis due to animal (cat) (dog) hair and dander: Secondary | ICD-10-CM | POA: Diagnosis not present

## 2023-08-20 DIAGNOSIS — M25569 Pain in unspecified knee: Secondary | ICD-10-CM | POA: Diagnosis not present

## 2023-08-20 DIAGNOSIS — M79669 Pain in unspecified lower leg: Secondary | ICD-10-CM | POA: Diagnosis not present

## 2023-08-21 ENCOUNTER — Encounter (HOSPITAL_BASED_OUTPATIENT_CLINIC_OR_DEPARTMENT_OTHER): Payer: Self-pay | Admitting: *Deleted

## 2023-09-04 DIAGNOSIS — J3081 Allergic rhinitis due to animal (cat) (dog) hair and dander: Secondary | ICD-10-CM | POA: Diagnosis not present

## 2023-09-04 DIAGNOSIS — J3089 Other allergic rhinitis: Secondary | ICD-10-CM | POA: Diagnosis not present

## 2023-09-04 DIAGNOSIS — J301 Allergic rhinitis due to pollen: Secondary | ICD-10-CM | POA: Diagnosis not present

## 2023-09-12 DIAGNOSIS — J3089 Other allergic rhinitis: Secondary | ICD-10-CM | POA: Diagnosis not present

## 2023-09-12 DIAGNOSIS — J3081 Allergic rhinitis due to animal (cat) (dog) hair and dander: Secondary | ICD-10-CM | POA: Diagnosis not present

## 2023-09-12 DIAGNOSIS — J301 Allergic rhinitis due to pollen: Secondary | ICD-10-CM | POA: Diagnosis not present

## 2023-09-17 DIAGNOSIS — M79669 Pain in unspecified lower leg: Secondary | ICD-10-CM | POA: Diagnosis not present

## 2023-09-17 DIAGNOSIS — M25569 Pain in unspecified knee: Secondary | ICD-10-CM | POA: Diagnosis not present

## 2023-09-20 DIAGNOSIS — J301 Allergic rhinitis due to pollen: Secondary | ICD-10-CM | POA: Diagnosis not present

## 2023-09-20 DIAGNOSIS — Z23 Encounter for immunization: Secondary | ICD-10-CM | POA: Diagnosis not present

## 2023-09-20 DIAGNOSIS — J3081 Allergic rhinitis due to animal (cat) (dog) hair and dander: Secondary | ICD-10-CM | POA: Diagnosis not present

## 2023-09-20 DIAGNOSIS — J3089 Other allergic rhinitis: Secondary | ICD-10-CM | POA: Diagnosis not present

## 2023-10-02 DIAGNOSIS — J301 Allergic rhinitis due to pollen: Secondary | ICD-10-CM | POA: Diagnosis not present

## 2023-10-02 DIAGNOSIS — J3081 Allergic rhinitis due to animal (cat) (dog) hair and dander: Secondary | ICD-10-CM | POA: Diagnosis not present

## 2023-10-02 DIAGNOSIS — J3089 Other allergic rhinitis: Secondary | ICD-10-CM | POA: Diagnosis not present

## 2023-10-08 DIAGNOSIS — J301 Allergic rhinitis due to pollen: Secondary | ICD-10-CM | POA: Diagnosis not present

## 2023-10-08 DIAGNOSIS — M79669 Pain in unspecified lower leg: Secondary | ICD-10-CM | POA: Diagnosis not present

## 2023-10-08 DIAGNOSIS — J3081 Allergic rhinitis due to animal (cat) (dog) hair and dander: Secondary | ICD-10-CM | POA: Diagnosis not present

## 2023-10-08 DIAGNOSIS — M25569 Pain in unspecified knee: Secondary | ICD-10-CM | POA: Diagnosis not present

## 2023-10-08 DIAGNOSIS — J3089 Other allergic rhinitis: Secondary | ICD-10-CM | POA: Diagnosis not present

## 2023-10-15 DIAGNOSIS — J301 Allergic rhinitis due to pollen: Secondary | ICD-10-CM | POA: Diagnosis not present

## 2023-10-15 DIAGNOSIS — J3081 Allergic rhinitis due to animal (cat) (dog) hair and dander: Secondary | ICD-10-CM | POA: Diagnosis not present

## 2023-10-15 DIAGNOSIS — J3089 Other allergic rhinitis: Secondary | ICD-10-CM | POA: Diagnosis not present

## 2023-10-24 DIAGNOSIS — J301 Allergic rhinitis due to pollen: Secondary | ICD-10-CM | POA: Diagnosis not present

## 2023-10-24 DIAGNOSIS — J3081 Allergic rhinitis due to animal (cat) (dog) hair and dander: Secondary | ICD-10-CM | POA: Diagnosis not present

## 2023-10-24 DIAGNOSIS — J3089 Other allergic rhinitis: Secondary | ICD-10-CM | POA: Diagnosis not present

## 2023-10-29 DIAGNOSIS — K2100 Gastro-esophageal reflux disease with esophagitis, without bleeding: Secondary | ICD-10-CM | POA: Diagnosis not present

## 2023-10-29 DIAGNOSIS — J3081 Allergic rhinitis due to animal (cat) (dog) hair and dander: Secondary | ICD-10-CM | POA: Diagnosis not present

## 2023-10-29 DIAGNOSIS — J301 Allergic rhinitis due to pollen: Secondary | ICD-10-CM | POA: Diagnosis not present

## 2023-10-29 DIAGNOSIS — J3089 Other allergic rhinitis: Secondary | ICD-10-CM | POA: Diagnosis not present

## 2023-11-04 DIAGNOSIS — M79669 Pain in unspecified lower leg: Secondary | ICD-10-CM | POA: Diagnosis not present

## 2023-11-04 DIAGNOSIS — M25569 Pain in unspecified knee: Secondary | ICD-10-CM | POA: Diagnosis not present

## 2023-11-18 ENCOUNTER — Encounter (HOSPITAL_BASED_OUTPATIENT_CLINIC_OR_DEPARTMENT_OTHER): Payer: Self-pay | Admitting: Obstetrics & Gynecology

## 2023-11-18 ENCOUNTER — Other Ambulatory Visit (HOSPITAL_COMMUNITY)
Admission: RE | Admit: 2023-11-18 | Discharge: 2023-11-18 | Disposition: A | Discharge status: Home / Self Care | Payer: PPO | Source: Ambulatory Visit | Attending: Obstetrics & Gynecology | Admitting: Obstetrics & Gynecology

## 2023-11-18 ENCOUNTER — Ambulatory Visit (HOSPITAL_BASED_OUTPATIENT_CLINIC_OR_DEPARTMENT_OTHER): Payer: PPO | Admitting: Obstetrics & Gynecology

## 2023-11-18 VITALS — BP 122/76 | HR 77 | Ht 58.5 in | Wt 106.6 lb

## 2023-11-18 DIAGNOSIS — Z01419 Encounter for gynecological examination (general) (routine) without abnormal findings: Secondary | ICD-10-CM

## 2023-11-18 DIAGNOSIS — N952 Postmenopausal atrophic vaginitis: Secondary | ICD-10-CM | POA: Diagnosis not present

## 2023-11-18 DIAGNOSIS — Z780 Asymptomatic menopausal state: Secondary | ICD-10-CM | POA: Diagnosis not present

## 2023-11-18 DIAGNOSIS — J301 Allergic rhinitis due to pollen: Secondary | ICD-10-CM | POA: Diagnosis not present

## 2023-11-18 DIAGNOSIS — J3081 Allergic rhinitis due to animal (cat) (dog) hair and dander: Secondary | ICD-10-CM | POA: Diagnosis not present

## 2023-11-18 DIAGNOSIS — Z124 Encounter for screening for malignant neoplasm of cervix: Secondary | ICD-10-CM | POA: Insufficient documentation

## 2023-11-18 DIAGNOSIS — J3089 Other allergic rhinitis: Secondary | ICD-10-CM | POA: Diagnosis not present

## 2023-11-18 DIAGNOSIS — M810 Age-related osteoporosis without current pathological fracture: Secondary | ICD-10-CM

## 2023-11-18 MED ORDER — ESTRADIOL 0.1 MG/GM VA CREA: TOPICAL_CREAM | VAGINAL | 2 refills | Status: AC

## 2023-11-18 NOTE — Progress Notes (Signed)
67 y.o. G2P2 Married White or Caucasian female here for breast and pelvic exam.  I am also following her for osteoporosis.  Last one was in 2021.  Discussed today.  For now, she doesn't want to have any treatment.  She is taking Vit D and gets calcium in her diet.  Supplemental calcium causes constipation so just doesn't tolerate this.  Exercising 3 times weekly with weight bearing exercise.    H/o knee replacement in early 2023.  Took about 9 months until she wasn't in pain.  Seeing PT at [facility] and this is helping.    Denies vaginal bleeding.  Using vaginal estrogen cream about every other week.  Needs RF.  Husband fell down stairs in their home and had a head injury and was hospitalized for about 5 days.  Had head injury but he is back to baseline.    Patient's last menstrual period was 11/19/1989.          Sexually active: Yes.    H/O STD:  no  Health Maintenance: PCP:  Dr. Lupita Raider at [facility].  Last wellness appt was 12/2023.  Did blood work at that appt: yes Vaccines are up to date:  Tdap is due.  Will do this with Dr. Clelia Croft with her next appt. Colonoscopy: 12/6//2023 Dr. Loreta Ave MMG:  07/15/2023 Negative BMD:  06/27/2020 Last pap smear:  11/09/2021 Negative.   H/o abnormal pap smear:  no    reports that she has never smoked. She has never used smokeless tobacco. She reports current alcohol use of about 10.0 standard drinks of alcohol per week. She reports that she does not use drugs.  Past Medical History:  Diagnosis Date   Anxiety    Arthritis    in knee   GERD (gastroesophageal reflux disease)    Heart palpitations    Due to anxiety    Hypothyroidism    IC (interstitial cystitis)    Myofascial pain dysfunction syndrome    Right side   Osteoporosis    Stomach ulcer    Thyroid disease     Past Surgical History:  Procedure Laterality Date   CERVICAL CONE BIOPSY     around age 89   CERVIX LESION DESTRUCTION     CESAREAN SECTION     CHOLECYSTECTOMY     DILATION  AND CURETTAGE OF UTERUS     KNEE ARTHROPLASTY Right 11/30/2021   Procedure: COMPUTER ASSISTED TOTAL KNEE ARTHROPLASTY;  Surgeon: Samson Frederic, MD;  Location: [facility];  Service: Orthopedics;  Laterality: Right;   LIPOMA EXCISION Right 07/09/2017   Leg    ROOT CANAL  11/13   3 on 2 teeth   ROOT CANAL  04/16/14 and 04/27/14   x2   TONSILLECTOMY      Current Outpatient Medications  Medication Sig Dispense Refill   dexlansoprazole (DEXILANT) 60 MG capsule Take 60 mg by mouth daily.     dexmethylphenidate (FOCALIN) 10 MG tablet Take 5 mg by mouth 2 (two) times daily.     EPIPEN 2-PAK 0.3 MG/0.3ML SOAJ injection Inject 0.3 mg into the muscle as needed for anaphylaxis.  1   FLUoxetine (PROZAC) 20 MG tablet Take 20 mg by mouth daily.     levothyroxine (SYNTHROID) 50 MCG tablet Take 50 mcg by mouth daily before breakfast.     OLANZapine (ZYPREXA) 2.5 MG tablet Take 0.625 mg by mouth at bedtime.     VITAMIN D PO Take 4,000 Units by mouth daily.     estradiol (  ESTRACE) 0.1 MG/GM vaginal cream Place 1 gram pv twice weekly 42.5 g 2   No current facility-administered medications for this visit.    Family History  Problem Relation Age of Onset   Alzheimer's disease Mother    Liver cancer Father    Cancer Other     Review of Systems  Constitutional: Negative.   Genitourinary: Negative.     Exam:   BP 122/76 (BP Location: Right Arm, Patient Position: Sitting, Cuff Size: Normal)   Pulse 77   Ht 4' 10.5" (1.486 m)   Wt 106 lb 9.6 oz (48.4 kg)   LMP 11/19/1989   BMI 21.90 kg/m   Height: 4' 10.5" (148.6 cm)  General appearance: alert, cooperative and appears stated age Breasts: normal appearance, no masses or tenderness Abdomen: soft, non-tender; bowel sounds normal; no masses,  no organomegaly Lymph nodes: Cervical, supraclavicular, and axillary nodes normal.  No abnormal inguinal nodes palpated Neurologic: Grossly normal  Pelvic: External genitalia:  no lesions              Urethra:   normal appearing urethra with no masses, tenderness or lesions              Bartholins and Skenes: normal                 Vagina: normal appearing vagina with atrophic changes and no discharge, no lesions              Cervix: no lesions              Pap taken: Yes.   Bimanual Exam:  Uterus:  normal size, contour, position, consistency, mobility, non-tender              Adnexa: normal adnexa and no mass, fullness, tenderness               Rectovaginal: Confirms               Anus:  normal sphincter tone, hemorrhoids  Chaperone, Ina Homes, CMA, was present for exam.  Assessment/Plan: 1. Encntr for gyn exam (general) (routine) w/o abn findings (Primary) - Pap smear obtained - Mammogram 06/2023 - Colonoscopy 2021 - Bone mineral density 2021 - lab work done with PCP, Dr. Clelia Croft - vaccines reviewed/updated.  Tdap is due.  Pt aware.  2. Postmenopausal  3. Vaginal atrophy - estradiol (ESTRACE) 0.1 MG/GM vaginal cream; Place 1 gram pv twice weekly  Dispense: 42.5 g; Refill: 2  4. Cervical cancer screening - Cytology - PAP( [facility])   5. Age-related osteoporosis without current pathological fracture - declines treatment

## 2023-11-21 LAB — CYTOLOGY - PAP
Adequacy: ABSENT
Diagnosis: NEGATIVE

## 2023-11-25 ENCOUNTER — Ambulatory Visit (HOSPITAL_BASED_OUTPATIENT_CLINIC_OR_DEPARTMENT_OTHER): Payer: PPO | Admitting: Obstetrics & Gynecology

## 2024-01-22 DIAGNOSIS — H40013 Open angle with borderline findings, low risk, bilateral: Secondary | ICD-10-CM | POA: Diagnosis not present

## 2024-01-27 DIAGNOSIS — E559 Vitamin D deficiency, unspecified: Secondary | ICD-10-CM | POA: Diagnosis not present

## 2024-01-27 DIAGNOSIS — Z136 Encounter for screening for cardiovascular disorders: Secondary | ICD-10-CM | POA: Diagnosis not present

## 2024-01-27 DIAGNOSIS — K219 Gastro-esophageal reflux disease without esophagitis: Secondary | ICD-10-CM | POA: Diagnosis not present

## 2024-01-27 DIAGNOSIS — Z131 Encounter for screening for diabetes mellitus: Secondary | ICD-10-CM | POA: Diagnosis not present

## 2024-01-27 DIAGNOSIS — Z13220 Encounter for screening for lipoid disorders: Secondary | ICD-10-CM | POA: Diagnosis not present

## 2024-01-27 DIAGNOSIS — Z79899 Other long term (current) drug therapy: Secondary | ICD-10-CM | POA: Diagnosis not present

## 2024-01-27 DIAGNOSIS — G4700 Insomnia, unspecified: Secondary | ICD-10-CM | POA: Diagnosis not present

## 2024-01-27 DIAGNOSIS — N952 Postmenopausal atrophic vaginitis: Secondary | ICD-10-CM | POA: Diagnosis not present

## 2024-01-27 DIAGNOSIS — Z23 Encounter for immunization: Secondary | ICD-10-CM | POA: Diagnosis not present

## 2024-01-27 DIAGNOSIS — F411 Generalized anxiety disorder: Secondary | ICD-10-CM | POA: Diagnosis not present

## 2024-01-27 DIAGNOSIS — E039 Hypothyroidism, unspecified: Secondary | ICD-10-CM | POA: Diagnosis not present

## 2024-01-27 DIAGNOSIS — M810 Age-related osteoporosis without current pathological fracture: Secondary | ICD-10-CM | POA: Diagnosis not present

## 2024-01-27 DIAGNOSIS — N3010 Interstitial cystitis (chronic) without hematuria: Secondary | ICD-10-CM | POA: Diagnosis not present

## 2024-01-27 DIAGNOSIS — Z0000 Encounter for general adult medical examination without abnormal findings: Secondary | ICD-10-CM | POA: Diagnosis not present

## 2024-01-30 DIAGNOSIS — M79669 Pain in unspecified lower leg: Secondary | ICD-10-CM | POA: Diagnosis not present

## 2024-01-30 DIAGNOSIS — M25569 Pain in unspecified knee: Secondary | ICD-10-CM | POA: Diagnosis not present

## 2024-01-31 DIAGNOSIS — M8588 Other specified disorders of bone density and structure, other site: Secondary | ICD-10-CM | POA: Diagnosis not present

## 2024-02-03 DIAGNOSIS — J3089 Other allergic rhinitis: Secondary | ICD-10-CM | POA: Diagnosis not present

## 2024-02-03 DIAGNOSIS — J301 Allergic rhinitis due to pollen: Secondary | ICD-10-CM | POA: Diagnosis not present

## 2024-02-03 DIAGNOSIS — J3081 Allergic rhinitis due to animal (cat) (dog) hair and dander: Secondary | ICD-10-CM | POA: Diagnosis not present

## 2024-02-12 DIAGNOSIS — M25569 Pain in unspecified knee: Secondary | ICD-10-CM | POA: Diagnosis not present

## 2024-02-12 DIAGNOSIS — M79669 Pain in unspecified lower leg: Secondary | ICD-10-CM | POA: Diagnosis not present

## 2024-02-21 DIAGNOSIS — J3081 Allergic rhinitis due to animal (cat) (dog) hair and dander: Secondary | ICD-10-CM | POA: Diagnosis not present

## 2024-02-21 DIAGNOSIS — J301 Allergic rhinitis due to pollen: Secondary | ICD-10-CM | POA: Diagnosis not present

## 2024-02-21 DIAGNOSIS — J3089 Other allergic rhinitis: Secondary | ICD-10-CM | POA: Diagnosis not present

## 2024-02-25 DIAGNOSIS — M25569 Pain in unspecified knee: Secondary | ICD-10-CM | POA: Diagnosis not present

## 2024-02-25 DIAGNOSIS — M79669 Pain in unspecified lower leg: Secondary | ICD-10-CM | POA: Diagnosis not present

## 2024-03-11 DIAGNOSIS — J301 Allergic rhinitis due to pollen: Secondary | ICD-10-CM | POA: Diagnosis not present

## 2024-03-11 DIAGNOSIS — J3081 Allergic rhinitis due to animal (cat) (dog) hair and dander: Secondary | ICD-10-CM | POA: Diagnosis not present

## 2024-03-11 DIAGNOSIS — M25569 Pain in unspecified knee: Secondary | ICD-10-CM | POA: Diagnosis not present

## 2024-03-11 DIAGNOSIS — J3089 Other allergic rhinitis: Secondary | ICD-10-CM | POA: Diagnosis not present

## 2024-03-11 DIAGNOSIS — M79669 Pain in unspecified lower leg: Secondary | ICD-10-CM | POA: Diagnosis not present

## 2024-03-25 DIAGNOSIS — M25569 Pain in unspecified knee: Secondary | ICD-10-CM | POA: Diagnosis not present

## 2024-03-25 DIAGNOSIS — M79669 Pain in unspecified lower leg: Secondary | ICD-10-CM | POA: Diagnosis not present

## 2024-03-27 DIAGNOSIS — J301 Allergic rhinitis due to pollen: Secondary | ICD-10-CM | POA: Diagnosis not present

## 2024-03-27 DIAGNOSIS — J3081 Allergic rhinitis due to animal (cat) (dog) hair and dander: Secondary | ICD-10-CM | POA: Diagnosis not present

## 2024-03-27 DIAGNOSIS — J3089 Other allergic rhinitis: Secondary | ICD-10-CM | POA: Diagnosis not present

## 2024-04-06 DIAGNOSIS — M79669 Pain in unspecified lower leg: Secondary | ICD-10-CM | POA: Diagnosis not present

## 2024-04-06 DIAGNOSIS — M25569 Pain in unspecified knee: Secondary | ICD-10-CM | POA: Diagnosis not present

## 2024-04-17 DIAGNOSIS — J3089 Other allergic rhinitis: Secondary | ICD-10-CM | POA: Diagnosis not present

## 2024-04-17 DIAGNOSIS — J301 Allergic rhinitis due to pollen: Secondary | ICD-10-CM | POA: Diagnosis not present

## 2024-04-17 DIAGNOSIS — J3081 Allergic rhinitis due to animal (cat) (dog) hair and dander: Secondary | ICD-10-CM | POA: Diagnosis not present

## 2024-04-22 DIAGNOSIS — M25569 Pain in unspecified knee: Secondary | ICD-10-CM | POA: Diagnosis not present

## 2024-04-22 DIAGNOSIS — M79669 Pain in unspecified lower leg: Secondary | ICD-10-CM | POA: Diagnosis not present

## 2024-05-01 DIAGNOSIS — J301 Allergic rhinitis due to pollen: Secondary | ICD-10-CM | POA: Diagnosis not present

## 2024-05-01 DIAGNOSIS — J3089 Other allergic rhinitis: Secondary | ICD-10-CM | POA: Diagnosis not present

## 2024-05-01 DIAGNOSIS — J3081 Allergic rhinitis due to animal (cat) (dog) hair and dander: Secondary | ICD-10-CM | POA: Diagnosis not present

## 2024-05-06 DIAGNOSIS — M25569 Pain in unspecified knee: Secondary | ICD-10-CM | POA: Diagnosis not present

## 2024-05-06 DIAGNOSIS — M79669 Pain in unspecified lower leg: Secondary | ICD-10-CM | POA: Diagnosis not present

## 2024-05-20 DIAGNOSIS — M25569 Pain in unspecified knee: Secondary | ICD-10-CM | POA: Diagnosis not present

## 2024-05-20 DIAGNOSIS — M79669 Pain in unspecified lower leg: Secondary | ICD-10-CM | POA: Diagnosis not present

## 2024-05-25 DIAGNOSIS — J301 Allergic rhinitis due to pollen: Secondary | ICD-10-CM | POA: Diagnosis not present

## 2024-05-25 DIAGNOSIS — J3081 Allergic rhinitis due to animal (cat) (dog) hair and dander: Secondary | ICD-10-CM | POA: Diagnosis not present

## 2024-05-25 DIAGNOSIS — J3089 Other allergic rhinitis: Secondary | ICD-10-CM | POA: Diagnosis not present

## 2024-06-18 DIAGNOSIS — M810 Age-related osteoporosis without current pathological fracture: Secondary | ICD-10-CM | POA: Diagnosis not present

## 2024-06-18 DIAGNOSIS — F411 Generalized anxiety disorder: Secondary | ICD-10-CM | POA: Diagnosis not present

## 2024-06-18 DIAGNOSIS — E039 Hypothyroidism, unspecified: Secondary | ICD-10-CM | POA: Diagnosis not present

## 2024-06-24 DIAGNOSIS — M25569 Pain in unspecified knee: Secondary | ICD-10-CM | POA: Diagnosis not present

## 2024-06-24 DIAGNOSIS — M79669 Pain in unspecified lower leg: Secondary | ICD-10-CM | POA: Diagnosis not present

## 2024-06-25 DIAGNOSIS — J3089 Other allergic rhinitis: Secondary | ICD-10-CM | POA: Diagnosis not present

## 2024-06-25 DIAGNOSIS — J3081 Allergic rhinitis due to animal (cat) (dog) hair and dander: Secondary | ICD-10-CM | POA: Diagnosis not present

## 2024-06-25 DIAGNOSIS — J301 Allergic rhinitis due to pollen: Secondary | ICD-10-CM | POA: Diagnosis not present

## 2024-07-08 DIAGNOSIS — M25569 Pain in unspecified knee: Secondary | ICD-10-CM | POA: Diagnosis not present

## 2024-07-08 DIAGNOSIS — M79669 Pain in unspecified lower leg: Secondary | ICD-10-CM | POA: Diagnosis not present

## 2024-07-09 DIAGNOSIS — J3089 Other allergic rhinitis: Secondary | ICD-10-CM | POA: Diagnosis not present

## 2024-07-09 DIAGNOSIS — J301 Allergic rhinitis due to pollen: Secondary | ICD-10-CM | POA: Diagnosis not present

## 2024-07-09 DIAGNOSIS — J3081 Allergic rhinitis due to animal (cat) (dog) hair and dander: Secondary | ICD-10-CM | POA: Diagnosis not present

## 2024-07-13 DIAGNOSIS — D225 Melanocytic nevi of trunk: Secondary | ICD-10-CM | POA: Diagnosis not present

## 2024-07-13 DIAGNOSIS — D2262 Melanocytic nevi of left upper limb, including shoulder: Secondary | ICD-10-CM | POA: Diagnosis not present

## 2024-07-13 DIAGNOSIS — L821 Other seborrheic keratosis: Secondary | ICD-10-CM | POA: Diagnosis not present

## 2024-07-13 DIAGNOSIS — L814 Other melanin hyperpigmentation: Secondary | ICD-10-CM | POA: Diagnosis not present

## 2024-07-13 DIAGNOSIS — L819 Disorder of pigmentation, unspecified: Secondary | ICD-10-CM | POA: Diagnosis not present

## 2024-07-13 DIAGNOSIS — D1801 Hemangioma of skin and subcutaneous tissue: Secondary | ICD-10-CM | POA: Diagnosis not present

## 2024-07-13 DIAGNOSIS — L570 Actinic keratosis: Secondary | ICD-10-CM | POA: Diagnosis not present

## 2024-07-15 ENCOUNTER — Encounter (HOSPITAL_BASED_OUTPATIENT_CLINIC_OR_DEPARTMENT_OTHER): Payer: Self-pay | Admitting: Obstetrics & Gynecology

## 2024-07-15 ENCOUNTER — Encounter (HOSPITAL_BASED_OUTPATIENT_CLINIC_OR_DEPARTMENT_OTHER): Payer: Self-pay

## 2024-07-15 DIAGNOSIS — Z1231 Encounter for screening mammogram for malignant neoplasm of breast: Secondary | ICD-10-CM | POA: Diagnosis not present

## 2024-07-16 DIAGNOSIS — J3081 Allergic rhinitis due to animal (cat) (dog) hair and dander: Secondary | ICD-10-CM | POA: Diagnosis not present

## 2024-07-16 DIAGNOSIS — J3089 Other allergic rhinitis: Secondary | ICD-10-CM | POA: Diagnosis not present

## 2024-07-16 DIAGNOSIS — J301 Allergic rhinitis due to pollen: Secondary | ICD-10-CM | POA: Diagnosis not present

## 2024-07-19 DIAGNOSIS — F411 Generalized anxiety disorder: Secondary | ICD-10-CM | POA: Diagnosis not present

## 2024-07-19 DIAGNOSIS — M810 Age-related osteoporosis without current pathological fracture: Secondary | ICD-10-CM | POA: Diagnosis not present

## 2024-07-19 DIAGNOSIS — E039 Hypothyroidism, unspecified: Secondary | ICD-10-CM | POA: Diagnosis not present

## 2024-07-28 ENCOUNTER — Ambulatory Visit (HOSPITAL_BASED_OUTPATIENT_CLINIC_OR_DEPARTMENT_OTHER): Payer: Self-pay | Admitting: Obstetrics & Gynecology

## 2024-08-12 DIAGNOSIS — M79669 Pain in unspecified lower leg: Secondary | ICD-10-CM | POA: Diagnosis not present

## 2024-08-12 DIAGNOSIS — J3089 Other allergic rhinitis: Secondary | ICD-10-CM | POA: Diagnosis not present

## 2024-08-12 DIAGNOSIS — J301 Allergic rhinitis due to pollen: Secondary | ICD-10-CM | POA: Diagnosis not present

## 2024-08-12 DIAGNOSIS — J3081 Allergic rhinitis due to animal (cat) (dog) hair and dander: Secondary | ICD-10-CM | POA: Diagnosis not present

## 2024-08-12 DIAGNOSIS — M25569 Pain in unspecified knee: Secondary | ICD-10-CM | POA: Diagnosis not present

## 2024-08-18 DIAGNOSIS — E039 Hypothyroidism, unspecified: Secondary | ICD-10-CM | POA: Diagnosis not present

## 2024-08-18 DIAGNOSIS — F411 Generalized anxiety disorder: Secondary | ICD-10-CM | POA: Diagnosis not present

## 2024-08-18 DIAGNOSIS — M810 Age-related osteoporosis without current pathological fracture: Secondary | ICD-10-CM | POA: Diagnosis not present

## 2024-08-21 DIAGNOSIS — J3081 Allergic rhinitis due to animal (cat) (dog) hair and dander: Secondary | ICD-10-CM | POA: Diagnosis not present

## 2024-08-21 DIAGNOSIS — J301 Allergic rhinitis due to pollen: Secondary | ICD-10-CM | POA: Diagnosis not present

## 2024-08-21 DIAGNOSIS — J3089 Other allergic rhinitis: Secondary | ICD-10-CM | POA: Diagnosis not present

## 2024-08-28 DIAGNOSIS — J301 Allergic rhinitis due to pollen: Secondary | ICD-10-CM | POA: Diagnosis not present

## 2024-08-28 DIAGNOSIS — J3089 Other allergic rhinitis: Secondary | ICD-10-CM | POA: Diagnosis not present

## 2024-08-28 DIAGNOSIS — J3081 Allergic rhinitis due to animal (cat) (dog) hair and dander: Secondary | ICD-10-CM | POA: Diagnosis not present

## 2024-09-10 DIAGNOSIS — J3081 Allergic rhinitis due to animal (cat) (dog) hair and dander: Secondary | ICD-10-CM | POA: Diagnosis not present

## 2024-09-10 DIAGNOSIS — J3089 Other allergic rhinitis: Secondary | ICD-10-CM | POA: Diagnosis not present

## 2024-09-10 DIAGNOSIS — J301 Allergic rhinitis due to pollen: Secondary | ICD-10-CM | POA: Diagnosis not present

## 2024-09-17 DIAGNOSIS — M79669 Pain in unspecified lower leg: Secondary | ICD-10-CM | POA: Diagnosis not present

## 2024-09-17 DIAGNOSIS — M25569 Pain in unspecified knee: Secondary | ICD-10-CM | POA: Diagnosis not present

## 2024-09-18 DIAGNOSIS — M810 Age-related osteoporosis without current pathological fracture: Secondary | ICD-10-CM | POA: Diagnosis not present

## 2024-09-18 DIAGNOSIS — E039 Hypothyroidism, unspecified: Secondary | ICD-10-CM | POA: Diagnosis not present

## 2024-09-18 DIAGNOSIS — F411 Generalized anxiety disorder: Secondary | ICD-10-CM | POA: Diagnosis not present

## 2024-09-22 DIAGNOSIS — J3089 Other allergic rhinitis: Secondary | ICD-10-CM | POA: Diagnosis not present

## 2024-09-22 DIAGNOSIS — J301 Allergic rhinitis due to pollen: Secondary | ICD-10-CM | POA: Diagnosis not present

## 2024-09-22 DIAGNOSIS — J3081 Allergic rhinitis due to animal (cat) (dog) hair and dander: Secondary | ICD-10-CM | POA: Diagnosis not present

## 2024-10-01 DIAGNOSIS — M79669 Pain in unspecified lower leg: Secondary | ICD-10-CM | POA: Diagnosis not present

## 2024-10-01 DIAGNOSIS — J3081 Allergic rhinitis due to animal (cat) (dog) hair and dander: Secondary | ICD-10-CM | POA: Diagnosis not present

## 2024-10-01 DIAGNOSIS — M25569 Pain in unspecified knee: Secondary | ICD-10-CM | POA: Diagnosis not present

## 2024-10-01 DIAGNOSIS — J3089 Other allergic rhinitis: Secondary | ICD-10-CM | POA: Diagnosis not present

## 2024-10-01 DIAGNOSIS — J301 Allergic rhinitis due to pollen: Secondary | ICD-10-CM | POA: Diagnosis not present

## 2024-10-07 DIAGNOSIS — J3081 Allergic rhinitis due to animal (cat) (dog) hair and dander: Secondary | ICD-10-CM | POA: Diagnosis not present

## 2024-10-07 DIAGNOSIS — J3089 Other allergic rhinitis: Secondary | ICD-10-CM | POA: Diagnosis not present

## 2024-10-07 DIAGNOSIS — J301 Allergic rhinitis due to pollen: Secondary | ICD-10-CM | POA: Diagnosis not present

## 2024-10-18 DIAGNOSIS — E039 Hypothyroidism, unspecified: Secondary | ICD-10-CM | POA: Diagnosis not present

## 2024-10-18 DIAGNOSIS — M810 Age-related osteoporosis without current pathological fracture: Secondary | ICD-10-CM | POA: Diagnosis not present

## 2024-10-18 DIAGNOSIS — F411 Generalized anxiety disorder: Secondary | ICD-10-CM | POA: Diagnosis not present

## 2024-10-21 DIAGNOSIS — J3081 Allergic rhinitis due to animal (cat) (dog) hair and dander: Secondary | ICD-10-CM | POA: Diagnosis not present

## 2024-10-21 DIAGNOSIS — J301 Allergic rhinitis due to pollen: Secondary | ICD-10-CM | POA: Diagnosis not present

## 2024-10-21 DIAGNOSIS — J3089 Other allergic rhinitis: Secondary | ICD-10-CM | POA: Diagnosis not present

## 2024-10-22 DIAGNOSIS — M79669 Pain in unspecified lower leg: Secondary | ICD-10-CM | POA: Diagnosis not present

## 2024-10-22 DIAGNOSIS — M25569 Pain in unspecified knee: Secondary | ICD-10-CM | POA: Diagnosis not present

## 2024-10-28 DIAGNOSIS — R490 Dysphonia: Secondary | ICD-10-CM | POA: Diagnosis not present

## 2024-10-28 DIAGNOSIS — K219 Gastro-esophageal reflux disease without esophagitis: Secondary | ICD-10-CM | POA: Diagnosis not present

## 2024-10-28 DIAGNOSIS — J300 Vasomotor rhinitis: Secondary | ICD-10-CM | POA: Diagnosis not present

## 2025-11-18 ENCOUNTER — Ambulatory Visit (HOSPITAL_BASED_OUTPATIENT_CLINIC_OR_DEPARTMENT_OTHER): Payer: PPO | Admitting: Obstetrics & Gynecology
